# Patient Record
Sex: Male | Born: 1960
Health system: Southern US, Community
[De-identification: ages and names within clinical notes are randomized; demographics above are authoritative.]

## PROBLEM LIST (undated history)

## (undated) DIAGNOSIS — I10 Essential (primary) hypertension: Secondary | ICD-10-CM

## (undated) DIAGNOSIS — F17200 Nicotine dependence, unspecified, uncomplicated: Secondary | ICD-10-CM

## (undated) DIAGNOSIS — Z8719 Personal history of other diseases of the digestive system: Secondary | ICD-10-CM

## (undated) DIAGNOSIS — K219 Gastro-esophageal reflux disease without esophagitis: Secondary | ICD-10-CM

## (undated) DIAGNOSIS — N529 Male erectile dysfunction, unspecified: Secondary | ICD-10-CM

## (undated) DIAGNOSIS — C801 Malignant (primary) neoplasm, unspecified: Secondary | ICD-10-CM

## (undated) DIAGNOSIS — I2510 Atherosclerotic heart disease of native coronary artery without angina pectoris: Secondary | ICD-10-CM

## (undated) DIAGNOSIS — E785 Hyperlipidemia, unspecified: Secondary | ICD-10-CM

## (undated) DIAGNOSIS — J449 Chronic obstructive pulmonary disease, unspecified: Secondary | ICD-10-CM

## (undated) DIAGNOSIS — I4510 Unspecified right bundle-branch block: Secondary | ICD-10-CM

## (undated) DIAGNOSIS — Z951 Presence of aortocoronary bypass graft: Secondary | ICD-10-CM

## (undated) DIAGNOSIS — Z8571 Personal history of Hodgkin lymphoma: Secondary | ICD-10-CM

## (undated) DIAGNOSIS — Z954 Presence of other heart-valve replacement: Secondary | ICD-10-CM

## (undated) DIAGNOSIS — Z862 Personal history of diseases of the blood and blood-forming organs and certain disorders involving the immune mechanism: Secondary | ICD-10-CM

## (undated) DIAGNOSIS — D759 Disease of blood and blood-forming organs, unspecified: Secondary | ICD-10-CM

## (undated) DIAGNOSIS — E079 Disorder of thyroid, unspecified: Secondary | ICD-10-CM

## (undated) DIAGNOSIS — I350 Nonrheumatic aortic (valve) stenosis: Secondary | ICD-10-CM

## (undated) DIAGNOSIS — Z7901 Long term (current) use of anticoagulants: Secondary | ICD-10-CM

## (undated) DIAGNOSIS — J189 Pneumonia, unspecified organism: Secondary | ICD-10-CM

## (undated) DIAGNOSIS — E039 Hypothyroidism, unspecified: Secondary | ICD-10-CM

## (undated) HISTORY — DX: Male erectile dysfunction, unspecified: N52.9

## (undated) HISTORY — DX: Personal history of Hodgkin lymphoma: Z85.71

## (undated) HISTORY — DX: Nicotine dependence, unspecified, uncomplicated: F17.200

## (undated) HISTORY — DX: Personal history of other diseases of the digestive system: Z87.19

## (undated) HISTORY — DX: Unspecified right bundle-branch block: I45.10

## (undated) HISTORY — DX: Presence of aortocoronary bypass graft: Z95.1

## (undated) HISTORY — DX: Atherosclerotic heart disease of native coronary artery without angina pectoris: I25.10

## (undated) HISTORY — DX: Nonrheumatic aortic (valve) stenosis: I35.0

## (undated) HISTORY — DX: Hyperlipidemia, unspecified: E78.5

## (undated) HISTORY — DX: Gastro-esophageal reflux disease without esophagitis: K21.9

## (undated) HISTORY — DX: Essential (primary) hypertension: I10

## (undated) HISTORY — DX: Long term (current) use of anticoagulants: Z79.01

## (undated) HISTORY — DX: Personal history of diseases of the blood and blood-forming organs and certain disorders involving the immune mechanism: Z86.2

---

## 1982-04-15 HISTORY — PX: OTHER SURGICAL HISTORY: SHX169

## 1995-04-16 HISTORY — PX: OTHER SURGICAL HISTORY: SHX169

## 1997-04-15 HISTORY — PX: ABDOMINAL SURGERY: SHX537

## 2000-09-10 ENCOUNTER — Ambulatory Visit (HOSPITAL_COMMUNITY): Admission: RE | Admit: 2000-09-10 | Discharge: 2000-09-10 | Payer: Self-pay | Admitting: Family Medicine

## 2011-09-12 ENCOUNTER — Other Ambulatory Visit: Payer: Self-pay | Admitting: Gastroenterology

## 2011-09-12 DIAGNOSIS — Z1211 Encounter for screening for malignant neoplasm of colon: Secondary | ICD-10-CM

## 2011-09-19 ENCOUNTER — Ambulatory Visit
Admission: RE | Admit: 2011-09-19 | Discharge: 2011-09-19 | Disposition: A | Discharge status: Home / Self Care | Payer: BC Managed Care – PPO | Source: Ambulatory Visit | Attending: Gastroenterology | Admitting: Gastroenterology

## 2011-09-19 DIAGNOSIS — Z1211 Encounter for screening for malignant neoplasm of colon: Secondary | ICD-10-CM

## 2011-10-29 ENCOUNTER — Encounter (INDEPENDENT_AMBULATORY_CARE_PROVIDER_SITE_OTHER): Payer: Self-pay

## 2011-11-01 ENCOUNTER — Encounter (INDEPENDENT_AMBULATORY_CARE_PROVIDER_SITE_OTHER): Payer: Self-pay | Admitting: Surgery

## 2011-11-22 ENCOUNTER — Ambulatory Visit (INDEPENDENT_AMBULATORY_CARE_PROVIDER_SITE_OTHER): Payer: BC Managed Care – PPO | Admitting: Surgery

## 2014-05-13 ENCOUNTER — Other Ambulatory Visit: Payer: Self-pay | Admitting: Family Medicine

## 2014-05-13 DIAGNOSIS — R911 Solitary pulmonary nodule: Secondary | ICD-10-CM

## 2014-05-24 ENCOUNTER — Ambulatory Visit
Admission: RE | Admit: 2014-05-24 | Discharge: 2014-05-24 | Disposition: A | Discharge status: Home / Self Care | Payer: Managed Care, Other (non HMO) | Source: Ambulatory Visit | Attending: Family Medicine | Admitting: Family Medicine

## 2014-05-24 DIAGNOSIS — R911 Solitary pulmonary nodule: Secondary | ICD-10-CM

## 2014-05-24 MED ORDER — IOHEXOL 300 MG/ML  SOLN
75.0000 mL | Freq: Once | INTRAMUSCULAR | Status: AC | PRN
  Administered 2014-05-24: 75 mL via INTRAVENOUS

## 2014-05-26 ENCOUNTER — Other Ambulatory Visit: Payer: Self-pay | Admitting: Family Medicine

## 2014-05-26 DIAGNOSIS — N2889 Other specified disorders of kidney and ureter: Secondary | ICD-10-CM

## 2014-05-31 ENCOUNTER — Other Ambulatory Visit: Payer: Managed Care, Other (non HMO)

## 2014-06-08 ENCOUNTER — Other Ambulatory Visit: Payer: Managed Care, Other (non HMO)

## 2014-08-09 ENCOUNTER — Emergency Department (HOSPITAL_COMMUNITY): Payer: Managed Care, Other (non HMO)

## 2014-08-09 ENCOUNTER — Encounter (HOSPITAL_COMMUNITY): Payer: Self-pay | Admitting: *Deleted

## 2014-08-09 ENCOUNTER — Emergency Department (HOSPITAL_COMMUNITY)
Admission: EM | Admit: 2014-08-09 | Discharge: 2014-08-09 | Disposition: A | Discharge status: Home / Self Care | Payer: Managed Care, Other (non HMO) | Attending: Emergency Medicine | Admitting: Emergency Medicine

## 2014-08-09 DIAGNOSIS — J159 Unspecified bacterial pneumonia: Secondary | ICD-10-CM | POA: Insufficient documentation

## 2014-08-09 DIAGNOSIS — Z87891 Personal history of nicotine dependence: Secondary | ICD-10-CM | POA: Diagnosis not present

## 2014-08-09 DIAGNOSIS — E785 Hyperlipidemia, unspecified: Secondary | ICD-10-CM | POA: Diagnosis not present

## 2014-08-09 DIAGNOSIS — R011 Cardiac murmur, unspecified: Secondary | ICD-10-CM | POA: Diagnosis not present

## 2014-08-09 DIAGNOSIS — Z7901 Long term (current) use of anticoagulants: Secondary | ICD-10-CM | POA: Insufficient documentation

## 2014-08-09 DIAGNOSIS — Z87438 Personal history of other diseases of male genital organs: Secondary | ICD-10-CM | POA: Diagnosis not present

## 2014-08-09 DIAGNOSIS — Z79899 Other long term (current) drug therapy: Secondary | ICD-10-CM | POA: Diagnosis not present

## 2014-08-09 DIAGNOSIS — Z8571 Personal history of Hodgkin lymphoma: Secondary | ICD-10-CM | POA: Insufficient documentation

## 2014-08-09 DIAGNOSIS — R042 Hemoptysis: Secondary | ICD-10-CM | POA: Diagnosis not present

## 2014-08-09 DIAGNOSIS — Z862 Personal history of diseases of the blood and blood-forming organs and certain disorders involving the immune mechanism: Secondary | ICD-10-CM | POA: Diagnosis not present

## 2014-08-09 DIAGNOSIS — I10 Essential (primary) hypertension: Secondary | ICD-10-CM | POA: Insufficient documentation

## 2014-08-09 DIAGNOSIS — R079 Chest pain, unspecified: Secondary | ICD-10-CM | POA: Diagnosis present

## 2014-08-09 DIAGNOSIS — E079 Disorder of thyroid, unspecified: Secondary | ICD-10-CM | POA: Diagnosis not present

## 2014-08-09 DIAGNOSIS — J189 Pneumonia, unspecified organism: Secondary | ICD-10-CM

## 2014-08-09 HISTORY — DX: Disorder of thyroid, unspecified: E07.9

## 2014-08-09 LAB — BASIC METABOLIC PANEL
ANION GAP: 10 (ref 5–15)
BUN: 17 mg/dL (ref 6–23)
CO2: 25 mmol/L (ref 19–32)
Calcium: 8.6 mg/dL (ref 8.4–10.5)
Chloride: 107 mmol/L (ref 96–112)
Creatinine, Ser: 0.98 mg/dL (ref 0.50–1.35)
GFR calc non Af Amer: >90 mL/min (ref >90)
Glucose, Bld: 98 mg/dL (ref 70–99)
POTASSIUM: 4.3 mmol/L (ref 3.5–5.1)
SODIUM: 142 mmol/L (ref 135–145)

## 2014-08-09 LAB — I-STAT TROPONIN, ED: TROPONIN I, POC: 0.02 ng/mL (ref 0.00–0.08)

## 2014-08-09 LAB — CBC
HEMATOCRIT: 39.4 % (ref 39.0–52.0)
HEMOGLOBIN: 13.1 g/dL (ref 13.0–17.0)
MCH: 28.8 pg (ref 26.0–34.0)
MCHC: 33.2 g/dL (ref 30.0–36.0)
MCV: 86.6 fL (ref 78.0–100.0)
Platelets: 225 10*3/uL (ref 150–400)
RBC: 4.55 MIL/uL (ref 4.22–5.81)
RDW: 13.4 % (ref 11.5–15.5)
WBC: 8.4 10*3/uL (ref 4.0–10.5)

## 2014-08-09 LAB — PROTIME-INR
INR: 2.48 — ABNORMAL HIGH (ref 0.00–1.49)
PROTHROMBIN TIME: 27.1 s — AB (ref 11.6–15.2)

## 2014-08-09 MED ORDER — IOHEXOL 350 MG/ML SOLN
100.0000 mL | Freq: Once | INTRAVENOUS | Status: AC | PRN
  Administered 2014-08-09: 100 mL via INTRAVENOUS

## 2014-08-09 MED ORDER — LEVOFLOXACIN 750 MG PO TABS
750.0000 mg | ORAL_TABLET | Freq: Every day | ORAL | Status: DC
  Administered 2014-08-09: 750 mg via ORAL
  Filled 2014-08-09: qty 1

## 2014-08-09 MED ORDER — LEVOFLOXACIN 750 MG PO TABS: 750.0000 mg | ORAL_TABLET | Freq: Every day | ORAL | Status: DC

## 2014-08-09 NOTE — ED Notes (Signed)
Pt referred from [facility] w/ c/o chest pressure and hemoptysis that began yesterday evening - pt denies shortness of breath, n/v, diaphoresis or fever. Pt in no acute distress, pleasant cooperative.

## 2014-08-09 NOTE — Discharge Instructions (Signed)
Hemoptysis °Hemoptysis, which means coughing up blood, can be a sign of a minor problem or a serious medical condition. The blood that is coughed up may come from the lungs and airways. Coughed-up blood can also come from bleeding that occurs outside the lungs and airways. Blood can drain into the windpipe during a severe nosebleed or when blood is vomited from the stomach. Because hemoptysis can be a sign of something serious, a medical evaluation is required. For some people with hemoptysis, no definite cause is ever identified. °CAUSES  °The most common cause of hemoptysis is bronchitis. Some other common causes include:  °· A ruptured blood vessel caused by coughing or an infection.   °· A medical condition that causes damage to the large air passageways (bronchiectasis).   °· A blood clot in the lungs (pulmonary embolism).   °· Pneumonia.   °· Tuberculosis.   °· Breathing in a small foreign object.   °· Cancer. °For some people with hemoptysis, no definite cause is ever identified.   °HOME CARE INSTRUCTIONS °· Only take over-the-counter or prescription medicines as directed by your caregiver. Do not use cough suppressants unless your caregiver approves. °· If your caregiver prescribes antibiotic medicines, take them as directed. Finish them even if you start to feel better. °· Do not smoke. Also avoid secondhand smoke. °· Follow up with your caregiver as directed. °SEEK IMMEDIATE MEDICAL CARE IF:  °· You cough up bloody mucus for longer than a week. °· You have a blood-producing cough that is severe or getting worse. °· You have a blood-producing cough that comes and goes over time. °· You develop problems with your breathing.   °· You vomit blood. °· You develop bloody or black-colored stools. °· You have chest pain.   °· You develop night sweats. °· You feel faint or pass out.   °· You have a fever or persistent symptoms for more than 2-3 days.   °· You have a fever and your symptoms suddenly get worse. °MAKE  SURE YOU: °· Understand these instructions. °· Will watch your condition. °· Will get help right away if you are not doing well or get worse. °Document Released: 06/10/2001 Document Revised: 03/18/2012 Document Reviewed: 01/17/2012 °ExitCare® Patient Information ©2015 [employer]. This information is not intended to replace advice given to you by your health care provider. Make sure you discuss any questions you have with your health care provider. ° °Pneumonia °Pneumonia is an infection of the lungs.  °CAUSES °Pneumonia may be caused by bacteria or a virus. Usually, these infections are caused by breathing infectious particles into the lungs (respiratory tract). °SIGNS AND SYMPTOMS  °· Cough. °· Fever. °· Chest pain. °· Increased rate of breathing. °· Wheezing. °· Mucus production. °DIAGNOSIS  °If you have the common symptoms of pneumonia, your health care provider will typically confirm the diagnosis with a chest X-ray. The X-ray will show an abnormality in the lung (pulmonary infiltrate) if you have pneumonia. Other tests of your blood, urine, or sputum may be done to find the specific cause of your pneumonia. Your health care provider may also do tests (blood gases or pulse oximetry) to see how well your lungs are working. °TREATMENT  °Some forms of pneumonia may be spread to other people when you cough or sneeze. You may be asked to wear a mask before and during your exam. Pneumonia that is caused by bacteria is treated with antibiotic medicine. Pneumonia that is caused by the influenza virus may be treated with an antiviral medicine. Most other viral infections must run their   course. These infections will not respond to antibiotics.  °HOME CARE INSTRUCTIONS  °· Cough suppressants may be used if you are losing too much rest. However, coughing protects you by clearing your lungs. You should avoid using cough suppressants if you can. °· Your health care provider may have prescribed medicine if he or she thinks  your pneumonia is caused by bacteria or influenza. Finish your medicine even if you start to feel better. °· Your health care provider may also prescribe an expectorant. This loosens the mucus to be coughed up. °· Take medicines only as directed by your health care provider. °· Do not smoke. Smoking is a common cause of bronchitis and can contribute to pneumonia. If you are a smoker and continue to smoke, your cough may last several weeks after your pneumonia has cleared. °· A cold steam vaporizer or humidifier in your room or home may help loosen mucus. °· Coughing is often worse at night. Sleeping in a semi-upright position in a recliner or using a couple pillows under your head will help with this. °· Get rest as you feel it is needed. Your body will usually let you know when you need to rest. °PREVENTION °A pneumococcal shot (vaccine) is available to prevent a common bacterial cause of pneumonia. This is usually suggested for: °· People over 65 years old. °· Patients on chemotherapy. °· People with chronic lung problems, such as bronchitis or emphysema. °· People with immune system problems. °If you are over 65 or have a high risk condition, you may receive the pneumococcal vaccine if you have not received it before. In some countries, a routine influenza vaccine is also recommended. This vaccine can help prevent some cases of pneumonia. You may be offered the influenza vaccine as part of your care. °If you smoke, it is time to quit. You may receive instructions on how to stop smoking. Your health care provider can provide medicines and counseling to help you quit. °SEEK MEDICAL CARE IF: °You have a fever. °SEEK IMMEDIATE MEDICAL CARE IF:  °· Your illness becomes worse. This is especially true if you are elderly or weakened from any other disease. °· You cannot control your cough with suppressants and are losing sleep. °· You begin coughing up blood. °· You develop pain which is getting worse or is uncontrolled  with medicines. °· Any of the symptoms which initially brought you in for treatment are getting worse rather than better. °· You develop shortness of breath or chest pain. °MAKE SURE YOU:  °· Understand these instructions. °· Will watch your condition. °· Will get help right away if you are not doing well or get worse. °Document Released: 04/01/2005 Document Revised: 08/16/2013 Document Reviewed: 06/21/2010 °ExitCare® Patient Information ©2015 [employer]. This information is not intended to replace advice given to you by your health care provider. Make sure you discuss any questions you have with your health care provider. ° °

## 2014-08-09 NOTE — Progress Notes (Signed)
EDCM spoke to patient at bedside.  Patient confirms his pcp is Dr. [name] at [facility].  System updated.

## 2014-08-09 NOTE — ED Provider Notes (Addendum)
CSN: [id number]     Arrival date & time 08/09/14  1848 History   First MD Initiated Contact with Patient 08/09/14 2041     Chief Complaint  Patient presents with  . Chest Pain  . Hemoptysis     (Consider location/radiation/quality/duration/timing/severity/associated sxs/prior Treatment) HPI Comments: Patient presents to the ER for evaluation of chest pressure and hemoptysis. Symptoms began yesterday. Patient reports a constant discomfort in the center of his chest. He was diagnosed with bronchitis several months ago and the cough never left. In the last 24 hours, however, he has been coughing up a fair amount of blood. He does report a history of lupus anticoagulant disorder, takes Coumadin chronically. He has never had a PE, reports clots in his small intestines previously. No abdominal pain. Patient does not feel short of breath. He has not been expressing palpitations.  Patient is a 54 y.o. male presenting with chest pain.  Chest Pain   Past Medical History  Diagnosis Date  . Hypertension   . Hyperlipidemia   . Erectile dysfunction   . Hx of lupus anticoagulant disorder     history of blood work showing lupus  . Thyroid disease   . Hodgkin's disease    Past Surgical History  Procedure Laterality Date  . Abdominal surgery     History reviewed. No pertinent family history. History  Substance Use Topics  . Smoking status: Former Smoker    Quit date: 06/12/2014  . Smokeless tobacco: Never Used  . Alcohol Use: No    Review of Systems  Respiratory:       Hemoptysis  Cardiovascular: Positive for chest pain.  All other systems reviewed and are negative.     Allergies  Review of patient's allergies indicates no known allergies.  Home Medications   Prior to Admission medications   Medication Sig Start Date End Date Taking? Authorizing Provider  acetaminophen (TYLENOL) 500 MG tablet Take 500 mg by mouth every 6 (six) hours as needed for headache.   Yes Historical  Provider, MD  fish oil-omega-3 fatty acids 1000 MG capsule Take 2 g by mouth at bedtime.    Yes Historical Provider, MD  levothyroxine (SYNTHROID, LEVOTHROID) 50 MCG tablet Take 1 tablet by mouth daily. 08/04/14  Yes Historical Provider, MD  QUINAPRIL HCL PO Take 40 mg by mouth 2 (two) times daily.    Yes Historical Provider, MD  SIMVASTATIN PO Take 80 mg by mouth every evening.    Yes Historical Provider, MD  warfarin (COUMADIN) 5 MG tablet Take 5 mg by mouth at bedtime.    Yes Historical Provider, MD  levofloxacin (LEVAQUIN) 750 MG tablet Take 1 tablet (750 mg total) by mouth daily. 08/09/14   Orpah Greek, MD   BP 151/87 mmHg  Pulse 76  Temp(Src) 98.3 F (36.8 C) (Oral)  Resp 17  SpO2 96% Physical Exam  Constitutional: He is oriented to person, place, and time. He appears well-developed and well-nourished. No distress.  HENT:  Head: Normocephalic and atraumatic.  Right Ear: Hearing normal.  Left Ear: Hearing normal.  Nose: Nose normal.  Mouth/Throat: Oropharynx is clear and moist and mucous membranes are normal.  Eyes: Conjunctivae and EOM are normal. Pupils are equal, round, and reactive to light.  Neck: Normal range of motion. Neck supple.  Cardiovascular: Regular rhythm, S1 normal and S2 normal.  Exam reveals no gallop and no friction rub.   Murmur heard.  Systolic murmur is present with a grade of 2/6  Pulmonary/Chest: Effort  normal and breath sounds normal. No respiratory distress. He exhibits no tenderness.  Abdominal: Soft. Normal appearance and bowel sounds are normal. There is no hepatosplenomegaly. There is no tenderness. There is no rebound, no guarding, no tenderness at McBurney's point and negative Murphy's sign. No hernia.  Musculoskeletal: Normal range of motion.  Neurological: He is alert and oriented to person, place, and time. He has normal strength. No cranial nerve deficit or sensory deficit. Coordination normal. GCS eye subscore is 4. GCS verbal subscore  is 5. GCS motor subscore is 6.  Skin: Skin is warm, dry and intact. No rash noted. No cyanosis.  Psychiatric: He has a normal mood and affect. His speech is normal and behavior is normal. Thought content normal.  Nursing note and vitals reviewed.   ED Course  Procedures (including critical care time) Labs Review Labs Reviewed  PROTIME-INR - Abnormal; Notable for the following:    Prothrombin Time 27.1 (*)    INR 2.48 (*)    All other components within normal limits  CBC  BASIC METABOLIC PANEL  I-STAT TROPOININ, ED    Imaging Review Dg Chest 2 View  08/09/2014   CLINICAL DATA:  One day history of chest pressure and discomfort. Intermittent hemoptysis  EXAM: CHEST  2 VIEW  COMPARISON:  chest radiograph May 01, 2011; chest CT May 24, 2014  FINDINGS: There is mild central interstitial prominence without airspace consolidation or edema. Heart size and pulmonary vascularity are normal. No adenopathy. No bone lesions.  IMPRESSION: Suspect central bronchiolitis.  No edema or consolidation.   Electronically Signed   By: Lowella Grip III M.D.   On: 08/09/2014 21:34   Ct Angio Chest Pe W/cm &/or Wo Cm  08/09/2014   CLINICAL DATA:  Acute onset of chest pressure and hemoptysis. Initial encounter.  EXAM: CT ANGIOGRAPHY CHEST WITH CONTRAST  TECHNIQUE: Multidetector CT imaging of the chest was performed using the standard protocol during bolus administration of intravenous contrast. Multiplanar CT image reconstructions and MIPs were obtained to evaluate the vascular anatomy.  CONTRAST:  157mL OMNIPAQUE IOHEXOL 350 MG/ML SOLN  COMPARISON:  CT of the chest performed 01/22/2015  FINDINGS: There is no evidence of pulmonary embolus.  Patchy airspace opacity is noted within the right upper lobe, and mild right basilar atelectasis is noted. The right upper lobe airspace opacity raises concern for pneumonia. There is no evidence of pleural effusion or pneumothorax. No masses are identified; no abnormal  focal contrast enhancement is seen.  The mediastinum is unremarkable in appearance. Dense calcification is noted at the aortic valve, mild calcification at the mitral valve, and scattered coronary artery calcifications are seen. No pericardial effusion is identified. The great vessels are grossly unremarkable in appearance, aside from scattered calcification along the proximal great vessels and aortic arch. No axillary lymphadenopathy is seen. The visualized portions of the thyroid gland are unremarkable in appearance.  The visualized portions of the liver and spleen are unremarkable.  No acute osseous abnormalities are seen. A vertebral body hemangioma is noted at T6.  Review of the MIP images confirms the above findings.  IMPRESSION: 1. No evidence of pulmonary embolus. 2. Patchy airspace opacity within the right upper lobe, and mild right basilar atelectasis. Right upper lobe airspace opacity raises concern for mild pneumonia. 3. Scattered coronary artery calcifications. Dense calcification at the aortic valve and mild calcification at the mitral valve.   Electronically Signed   By: Garald Balding M.D.   On: 08/09/2014 22:52  EKG Interpretation   Date/Time:  Tuesday August 09 2014 19:00:59 EDT Ventricular Rate:  79 PR Interval:  192 QRS Duration: 70 QT Interval:  379 QTC Calculation: 434 R Axis:   68 Text Interpretation:  Sinus rhythm Abnormal R-wave progression, early  transition Baseline wander in lead(s) V2 No previous tracing Reconfirmed  by POLLINA  MD, [place] [phone]) on 08/09/2014 11:15:25 PM      MDM   Final diagnoses:  Hemoptysis  CAP (community acquired pneumonia)    Patient presents to the ER for evaluation of cough which has been productive of blood and sputum as well as pressure in his chest. Patient does have a history of lupus anticoagulant. He is on Coumadin and is therapeutic. EKG does not suggest ischemia or infarct. Troponin is negative. Chest x-ray did not show  any acute abnormality. Based on his hypercoagulable state, PE was considered. CT scan was performed. No evidence of PE was seen, but he does have evidence of right upper lobe pneumonia. Patient is not hypoxic. He is appropriate for outpatient treatment of community-acquired pneumonia. He was initiated on Levaquin. Will need to follow-up with his primary doctor to ensure resolution and repeat INR.    Orpah Greek, MD 08/09/14 2317  Orpah Greek, MD 08/09/14 [phone]

## 2015-05-18 ENCOUNTER — Other Ambulatory Visit: Payer: Self-pay | Admitting: Family Medicine

## 2015-05-18 DIAGNOSIS — R93429 Abnormal radiologic findings on diagnostic imaging of unspecified kidney: Secondary | ICD-10-CM

## 2015-06-23 ENCOUNTER — Other Ambulatory Visit: Payer: Self-pay | Admitting: Family Medicine

## 2015-06-23 DIAGNOSIS — N2889 Other specified disorders of kidney and ureter: Secondary | ICD-10-CM

## 2015-06-23 DIAGNOSIS — R9389 Abnormal findings on diagnostic imaging of other specified body structures: Secondary | ICD-10-CM

## 2016-04-15 DIAGNOSIS — I2510 Atherosclerotic heart disease of native coronary artery without angina pectoris: Secondary | ICD-10-CM

## 2016-04-15 HISTORY — DX: Atherosclerotic heart disease of native coronary artery without angina pectoris: I25.10

## 2016-04-17 ENCOUNTER — Other Ambulatory Visit: Payer: Self-pay | Admitting: Urology

## 2016-04-17 DIAGNOSIS — D4101 Neoplasm of uncertain behavior of right kidney: Secondary | ICD-10-CM

## 2016-04-30 ENCOUNTER — Ambulatory Visit (HOSPITAL_COMMUNITY): Payer: Managed Care, Other (non HMO)

## 2016-08-13 DIAGNOSIS — C801 Malignant (primary) neoplasm, unspecified: Secondary | ICD-10-CM

## 2016-08-13 HISTORY — DX: Malignant (primary) neoplasm, unspecified: C80.1

## 2016-08-21 ENCOUNTER — Other Ambulatory Visit: Payer: Self-pay | Admitting: Urology

## 2016-08-29 NOTE — Patient Instructions (Signed)
Michael Delgado  08/29/2016   Your procedure is scheduled on: 09-11-16  Report to [facility] to 3rd floor to [place] at 08:30 AM.    Call this number if you have problems the morning of surgery [phone]    Remember: ONLY 1 PERSON MAY GO WITH YOU TO SHORT STAY TO GET  READY MORNING OF [place].  Do not eat food or drink liquids :After Midnight.   Please consume a clear liquid diet the day before surgery to prevent dehydration.   CLEAR LIQUID DIET   Foods Allowed                                                                     Foods Excluded  Coffee and tea, regular and decaf                             liquids that you cannot  Plain Jell-O in any flavor                                             see through such as: Fruit ices (not with fruit pulp)                                     milk, soups, orange juice  Iced Popsicles                                    All solid food Carbonated beverages, regular and diet                                    Cranberry, grape and apple juices Sports drinks like Gatorade Lightly seasoned clear broth or consume(fat free) Sugar, honey syrup  Sample Menu Breakfast                                Lunch                                     Supper Cranberry juice                    Beef broth                            Chicken broth Jell-O                                     Grape juice  Apple juice Coffee or tea                        Jell-O                                      Popsicle                                                Coffee or tea                        Coffee or tea  _____________________________________________________________________    Take these medicines the morning of surgery with A SIP OF WATER: Levothyroxine (Synthroid)                               You may not have any metal on your body including hair pins and       piercings  Do not wear jewelry, make-up, lotions, powders or perfumes, deodorant             Men may shave face and neck.   Do not bring valuables to the hospital. [place].  Contacts, dentures or bridgework may not be worn into surgery.  Leave suitcase in the car. After surgery it may be brought to your room.                  Please read over the following fact sheets you were given: _____________________________________________________________________  [facility] - Preparing for Surgery Before surgery, you can play an important role.  Because skin is not sterile, your skin needs to be as free of germs as possible.  You can reduce the number of germs on your skin by washing with CHG (chlorahexidine gluconate) soap before surgery.  CHG is an antiseptic cleaner which kills germs and bonds with the skin to continue killing germs even after washing. Please DO NOT use if you have an allergy to CHG or antibacterial soaps.  If your skin becomes reddened/irritated stop using the CHG and inform your nurse when you arrive at Short Stay. Do not shave (including legs and underarms) for at least 48 hours prior to the first CHG shower.  You may shave your face/neck. Please follow these instructions carefully:  1.  Shower with CHG Soap the night before surgery and the  morning of Surgery.  2.  If you choose to wash your hair, wash your hair first as usual with your  normal  shampoo.  3.  After you shampoo, rinse your hair and body thoroughly to remove the  shampoo.                           4.  Use CHG as you would any other liquid soap.  You can apply chg directly  to the skin and wash                       Gently with a scrungie or clean washcloth.  5.  Apply the  CHG Soap to your body ONLY FROM THE NECK DOWN.   Do not use on face/ open                           Wound or open sores. Avoid contact with eyes, ears mouth and genitals (private parts).                        Wash face,  Genitals (private parts) with your normal soap.             6.  Wash thoroughly, paying special attention to the area where your surgery  will be performed.  7.  Thoroughly rinse your body with warm water from the neck down.  8.  DO NOT shower/wash with your normal soap after using and rinsing off  the CHG Soap.                9.  Pat yourself dry with a clean towel.            10.  Wear clean pajamas.            11.  Place clean sheets on your bed the night of your first shower and do not  sleep with pets. Day of Surgery : Do not apply any lotions/deodorants the morning of surgery.  Please wear clean clothes to the hospital/surgery center.  FAILURE TO FOLLOW THESE INSTRUCTIONS MAY RESULT IN THE CANCELLATION OF YOUR SURGERY PATIENT SIGNATURE_________________________________  NURSE SIGNATURE__________________________________  ________________________________________________________________________

## 2016-09-03 ENCOUNTER — Encounter (HOSPITAL_COMMUNITY)
Admission: RE | Admit: 2016-09-03 | Discharge: 2016-09-03 | Disposition: A | Discharge status: Home / Self Care | Payer: Managed Care, Other (non HMO) | Source: Ambulatory Visit | Attending: Urology | Admitting: Urology

## 2016-09-03 ENCOUNTER — Encounter (HOSPITAL_COMMUNITY): Payer: Self-pay

## 2016-09-03 ENCOUNTER — Encounter (INDEPENDENT_AMBULATORY_CARE_PROVIDER_SITE_OTHER): Payer: Self-pay

## 2016-09-03 DIAGNOSIS — Z01812 Encounter for preprocedural laboratory examination: Secondary | ICD-10-CM | POA: Diagnosis present

## 2016-09-03 DIAGNOSIS — Z01810 Encounter for preprocedural cardiovascular examination: Secondary | ICD-10-CM | POA: Insufficient documentation

## 2016-09-03 DIAGNOSIS — I10 Essential (primary) hypertension: Secondary | ICD-10-CM | POA: Insufficient documentation

## 2016-09-03 HISTORY — DX: Hypothyroidism, unspecified: E03.9

## 2016-09-03 LAB — BASIC METABOLIC PANEL
Anion gap: 8 (ref 5–15)
BUN: 24 mg/dL — ABNORMAL HIGH (ref 6–20)
CHLORIDE: 104 mmol/L (ref 101–111)
CO2: 27 mmol/L (ref 22–32)
CREATININE: 1.05 mg/dL (ref 0.61–1.24)
Calcium: 9 mg/dL (ref 8.9–10.3)
GFR calc non Af Amer: >60 mL/min (ref >60)
Glucose, Bld: 116 mg/dL — ABNORMAL HIGH (ref 65–99)
POTASSIUM: 4.4 mmol/L (ref 3.5–5.1)
SODIUM: 139 mmol/L (ref 135–145)

## 2016-09-03 LAB — CBC
HCT: 42 % (ref 39.0–52.0)
Hemoglobin: 13.7 g/dL (ref 13.0–17.0)
MCH: 27.8 pg (ref 26.0–34.0)
MCHC: 32.6 g/dL (ref 30.0–36.0)
MCV: 85.4 fL (ref 78.0–100.0)
PLATELETS: 269 10*3/uL (ref 150–400)
RBC: 4.92 MIL/uL (ref 4.22–5.81)
RDW: 13.4 % (ref 11.5–15.5)
WBC: 6.2 10*3/uL (ref 4.0–10.5)

## 2016-09-03 LAB — ABO/RH: ABO/RH(D): A POS

## 2016-09-03 NOTE — Progress Notes (Signed)
09-03-16 BMP results routed to Dr. Tresa Moore for review.

## 2016-09-10 MED ORDER — DEXTROSE 5 % IV SOLN
3.0000 g | INTRAVENOUS | Status: AC
  Administered 2016-09-11: 3 g via INTRAVENOUS
  Filled 2016-09-10: qty 3

## 2016-09-11 ENCOUNTER — Inpatient Hospital Stay (HOSPITAL_COMMUNITY)
Admission: RE | Admit: 2016-09-11 | Discharge: 2016-09-12 | DRG: 660 | Disposition: A | Discharge status: Home / Self Care | Payer: Managed Care, Other (non HMO) | Source: Ambulatory Visit | Attending: Urology | Admitting: Urology

## 2016-09-11 ENCOUNTER — Inpatient Hospital Stay (HOSPITAL_COMMUNITY): Payer: Managed Care, Other (non HMO) | Admitting: Certified Registered Nurse Anesthetist

## 2016-09-11 ENCOUNTER — Encounter (HOSPITAL_COMMUNITY): Payer: Self-pay | Admitting: *Deleted

## 2016-09-11 ENCOUNTER — Encounter (HOSPITAL_COMMUNITY)
Admission: RE | Disposition: A | Discharge status: Home / Self Care | Payer: Self-pay | Source: Ambulatory Visit | Attending: Urology

## 2016-09-11 DIAGNOSIS — J449 Chronic obstructive pulmonary disease, unspecified: Secondary | ICD-10-CM | POA: Diagnosis present

## 2016-09-11 DIAGNOSIS — Z8571 Personal history of Hodgkin lymphoma: Secondary | ICD-10-CM | POA: Diagnosis not present

## 2016-09-11 DIAGNOSIS — E669 Obesity, unspecified: Secondary | ICD-10-CM | POA: Diagnosis present

## 2016-09-11 DIAGNOSIS — Z9049 Acquired absence of other specified parts of digestive tract: Secondary | ICD-10-CM

## 2016-09-11 DIAGNOSIS — Z9221 Personal history of antineoplastic chemotherapy: Secondary | ICD-10-CM

## 2016-09-11 DIAGNOSIS — N2889 Other specified disorders of kidney and ureter: Secondary | ICD-10-CM | POA: Diagnosis present

## 2016-09-11 DIAGNOSIS — E785 Hyperlipidemia, unspecified: Secondary | ICD-10-CM | POA: Diagnosis present

## 2016-09-11 DIAGNOSIS — E039 Hypothyroidism, unspecified: Secondary | ICD-10-CM | POA: Diagnosis present

## 2016-09-11 DIAGNOSIS — I10 Essential (primary) hypertension: Secondary | ICD-10-CM | POA: Diagnosis present

## 2016-09-11 DIAGNOSIS — Z6839 Body mass index (BMI) 39.0-39.9, adult: Secondary | ICD-10-CM | POA: Diagnosis not present

## 2016-09-11 DIAGNOSIS — Z85528 Personal history of other malignant neoplasm of kidney: Secondary | ICD-10-CM | POA: Diagnosis present

## 2016-09-11 DIAGNOSIS — Z87891 Personal history of nicotine dependence: Secondary | ICD-10-CM

## 2016-09-11 DIAGNOSIS — D6862 Lupus anticoagulant syndrome: Secondary | ICD-10-CM | POA: Diagnosis present

## 2016-09-11 HISTORY — PX: ROBOTIC ASSITED PARTIAL NEPHRECTOMY: SHX6087

## 2016-09-11 LAB — TYPE AND SCREEN
ABO/RH(D): A POS
ANTIBODY SCREEN: NEGATIVE

## 2016-09-11 LAB — HEMOGLOBIN AND HEMATOCRIT, BLOOD
HEMATOCRIT: 38.3 % — AB (ref 39.0–52.0)
HEMOGLOBIN: 12.7 g/dL — AB (ref 13.0–17.0)

## 2016-09-11 SURGERY — NEPHRECTOMY, PARTIAL, ROBOT-ASSISTED
Anesthesia: General | Laterality: Right

## 2016-09-11 MED ORDER — ONDANSETRON HCL 4 MG/2ML IJ SOLN
INTRAMUSCULAR | Status: AC
  Filled 2016-09-11: qty 2

## 2016-09-11 MED ORDER — LIDOCAINE 2% (20 MG/ML) 5 ML SYRINGE
INTRAMUSCULAR | Status: DC | PRN
  Administered 2016-09-11: 100 mg via INTRAVENOUS

## 2016-09-11 MED ORDER — HYDROMORPHONE HCL 1 MG/ML IJ SOLN
0.2500 mg | INTRAMUSCULAR | Status: DC | PRN
  Administered 2016-09-11 (×3): 0.5 mg via INTRAVENOUS

## 2016-09-11 MED ORDER — MAGNESIUM CITRATE PO SOLN
1.0000 | Freq: Once | ORAL | Status: DC
  Filled 2016-09-11: qty 296

## 2016-09-11 MED ORDER — DEXTROSE-NACL 5-0.45 % IV SOLN
INTRAVENOUS | Status: DC
  Administered 2016-09-11: 16:00:00 via INTRAVENOUS

## 2016-09-11 MED ORDER — FENTANYL CITRATE (PF) 100 MCG/2ML IJ SOLN
INTRAMUSCULAR | Status: DC | PRN
  Administered 2016-09-11 (×2): 50 ug via INTRAVENOUS
  Administered 2016-09-11 (×2): 25 ug via INTRAVENOUS

## 2016-09-11 MED ORDER — MIDAZOLAM HCL 5 MG/5ML IJ SOLN
INTRAMUSCULAR | Status: DC | PRN
  Administered 2016-09-11: 2 mg via INTRAVENOUS

## 2016-09-11 MED ORDER — SIMVASTATIN 40 MG PO TABS
40.0000 mg | ORAL_TABLET | Freq: Every evening | ORAL | Status: DC
  Administered 2016-09-11: 40 mg via ORAL
  Filled 2016-09-11: qty 1

## 2016-09-11 MED ORDER — OXYCODONE HCL 5 MG PO TABS
5.0000 mg | ORAL_TABLET | ORAL | Status: DC | PRN
  Administered 2016-09-12 (×3): 5 mg via ORAL
  Filled 2016-09-11 (×3): qty 1

## 2016-09-11 MED ORDER — BUPIVACAINE LIPOSOME 1.3 % IJ SUSP
20.0000 mL | Freq: Once | INTRAMUSCULAR | Status: AC
  Administered 2016-09-11: 20 mL
  Filled 2016-09-11: qty 20

## 2016-09-11 MED ORDER — PROPOFOL 10 MG/ML IV BOLUS
INTRAVENOUS | Status: AC
  Filled 2016-09-11: qty 40

## 2016-09-11 MED ORDER — ONDANSETRON HCL 4 MG/2ML IJ SOLN
INTRAMUSCULAR | Status: DC | PRN
  Administered 2016-09-11: 4 mg via INTRAVENOUS

## 2016-09-11 MED ORDER — LACTATED RINGERS IR SOLN
Status: DC | PRN
  Administered 2016-09-11: 1000 mL

## 2016-09-11 MED ORDER — LEVOTHYROXINE SODIUM 50 MCG PO TABS
50.0000 ug | ORAL_TABLET | Freq: Every day | ORAL | Status: DC
  Administered 2016-09-12: 50 ug via ORAL
  Filled 2016-09-11: qty 1

## 2016-09-11 MED ORDER — HYDROMORPHONE HCL 1 MG/ML IJ SOLN
INTRAMUSCULAR | Status: AC
  Filled 2016-09-11: qty 1

## 2016-09-11 MED ORDER — MEPERIDINE HCL 50 MG/ML IJ SOLN: 6.2500 mg | INTRAMUSCULAR | Status: DC | PRN

## 2016-09-11 MED ORDER — KETOROLAC TROMETHAMINE 30 MG/ML IJ SOLN
30.0000 mg | Freq: Once | INTRAMUSCULAR | Status: DC | PRN
  Administered 2016-09-11: 30 mg via INTRAVENOUS

## 2016-09-11 MED ORDER — ONDANSETRON HCL 4 MG/2ML IJ SOLN: 4.0000 mg | INTRAMUSCULAR | Status: DC | PRN

## 2016-09-11 MED ORDER — POTASSIUM 99 MG PO TABS: 1.0000 | ORAL_TABLET | Freq: Every day | ORAL | Status: DC

## 2016-09-11 MED ORDER — HYDROCODONE-ACETAMINOPHEN 5-325 MG PO TABS: 1.0000 | ORAL_TABLET | Freq: Four times a day (QID) | ORAL | 0 refills | Status: DC | PRN

## 2016-09-11 MED ORDER — SUCCINYLCHOLINE CHLORIDE 200 MG/10ML IV SOSY
PREFILLED_SYRINGE | INTRAVENOUS | Status: AC
  Filled 2016-09-11: qty 10

## 2016-09-11 MED ORDER — LACTATED RINGERS IV SOLN
INTRAVENOUS | Status: DC
  Administered 2016-09-11: 10:00:00 via INTRAVENOUS

## 2016-09-11 MED ORDER — ACETAMINOPHEN 500 MG PO TABS
1000.0000 mg | ORAL_TABLET | Freq: Four times a day (QID) | ORAL | Status: AC
  Administered 2016-09-11 – 2016-09-12 (×4): 1000 mg via ORAL
  Filled 2016-09-11 (×4): qty 2

## 2016-09-11 MED ORDER — HYDROMORPHONE HCL 1 MG/ML IJ SOLN: 0.5000 mg | INTRAMUSCULAR | Status: DC | PRN

## 2016-09-11 MED ORDER — DEXAMETHASONE SODIUM PHOSPHATE 10 MG/ML IJ SOLN
INTRAMUSCULAR | Status: AC
  Filled 2016-09-11: qty 1

## 2016-09-11 MED ORDER — DEXTROSE 5 % IV SOLN
INTRAVENOUS | Status: DC | PRN
  Administered 2016-09-11: 50 ug/min via INTRAVENOUS

## 2016-09-11 MED ORDER — EPHEDRINE SULFATE 50 MG/ML IJ SOLN
INTRAMUSCULAR | Status: DC | PRN
  Administered 2016-09-11: 10 mg via INTRAVENOUS

## 2016-09-11 MED ORDER — SODIUM CHLORIDE 0.9 % IJ SOLN
INTRAMUSCULAR | Status: DC | PRN
  Administered 2016-09-11: 20 mL

## 2016-09-11 MED ORDER — PROPOFOL 10 MG/ML IV BOLUS
INTRAVENOUS | Status: DC | PRN
  Administered 2016-09-11: 200 mg via INTRAVENOUS

## 2016-09-11 MED ORDER — SUGAMMADEX SODIUM 500 MG/5ML IV SOLN
INTRAVENOUS | Status: AC
  Filled 2016-09-11: qty 5

## 2016-09-11 MED ORDER — DEXAMETHASONE SODIUM PHOSPHATE 10 MG/ML IJ SOLN
INTRAMUSCULAR | Status: DC | PRN
  Administered 2016-09-11: 10 mg via INTRAVENOUS

## 2016-09-11 MED ORDER — SODIUM CHLORIDE 0.9 % IJ SOLN
INTRAMUSCULAR | Status: AC
  Filled 2016-09-11: qty 20

## 2016-09-11 MED ORDER — DIPHENHYDRAMINE HCL 50 MG/ML IJ SOLN: 12.5000 mg | Freq: Four times a day (QID) | INTRAMUSCULAR | Status: DC | PRN

## 2016-09-11 MED ORDER — SUGAMMADEX SODIUM 200 MG/2ML IV SOLN
INTRAVENOUS | Status: DC | PRN
  Administered 2016-09-11: 300 mg via INTRAVENOUS

## 2016-09-11 MED ORDER — KETOROLAC TROMETHAMINE 30 MG/ML IJ SOLN
INTRAMUSCULAR | Status: AC
  Filled 2016-09-11: qty 1

## 2016-09-11 MED ORDER — ROCURONIUM BROMIDE 50 MG/5ML IV SOSY
PREFILLED_SYRINGE | INTRAVENOUS | Status: DC | PRN
Start: 2016-09-11 — End: 2016-09-11
  Administered 2016-09-11: 10 mg via INTRAVENOUS
  Administered 2016-09-11: 5 mg via INTRAVENOUS
  Administered 2016-09-11: 20 mg via INTRAVENOUS
  Administered 2016-09-11: 10 mg via INTRAVENOUS
  Administered 2016-09-11: 20 mg via INTRAVENOUS
  Administered 2016-09-11: 35 mg via INTRAVENOUS
  Administered 2016-09-11 (×2): 10 mg via INTRAVENOUS

## 2016-09-11 MED ORDER — LIDOCAINE 2% (20 MG/ML) 5 ML SYRINGE
INTRAMUSCULAR | Status: AC
  Filled 2016-09-11: qty 5

## 2016-09-11 MED ORDER — PHENYLEPHRINE HCL 10 MG/ML IJ SOLN
INTRAMUSCULAR | Status: DC | PRN
  Administered 2016-09-11 (×3): 120 ug via INTRAVENOUS

## 2016-09-11 MED ORDER — HYDROMORPHONE HCL 1 MG/ML IJ SOLN
INTRAMUSCULAR | Status: AC
  Filled 2016-09-11: qty 0.5

## 2016-09-11 MED ORDER — ROCURONIUM BROMIDE 50 MG/5ML IV SOSY
PREFILLED_SYRINGE | INTRAVENOUS | Status: AC
  Filled 2016-09-11: qty 5

## 2016-09-11 MED ORDER — ROCURONIUM BROMIDE 50 MG/5ML IV SOSY
PREFILLED_SYRINGE | INTRAVENOUS | Status: AC
  Filled 2016-09-11: qty 10

## 2016-09-11 MED ORDER — STERILE WATER FOR IRRIGATION IR SOLN
Status: DC | PRN
  Administered 2016-09-11: 1000 mL

## 2016-09-11 MED ORDER — MIDAZOLAM HCL 2 MG/2ML IJ SOLN
INTRAMUSCULAR | Status: AC
  Filled 2016-09-11: qty 2

## 2016-09-11 MED ORDER — DIPHENHYDRAMINE HCL 12.5 MG/5ML PO ELIX: 12.5000 mg | ORAL_SOLUTION | Freq: Four times a day (QID) | ORAL | Status: DC | PRN

## 2016-09-11 MED ORDER — FENTANYL CITRATE (PF) 250 MCG/5ML IJ SOLN
INTRAMUSCULAR | Status: AC
  Filled 2016-09-11: qty 5

## 2016-09-11 SURGICAL SUPPLY — 78 items
ADH SKN CLS APL DERMABOND .7 (GAUZE/BANDAGES/DRESSINGS) ×1
AGENT HMST KT MTR STRL THRMB (HEMOSTASIS) ×1
APL ESCP 34 STRL LF DISP (HEMOSTASIS)
APPLICATOR COTTON TIP 6IN STRL (MISCELLANEOUS) ×1 IMPLANT
APPLICATOR SURGIFLO ENDO (HEMOSTASIS) ×1 IMPLANT
BAG SPEC RTRVL LRG 6X4 10 (ENDOMECHANICALS) ×1
CHLORAPREP W/TINT 26ML (MISCELLANEOUS) ×2 IMPLANT
CLIP LIGATING HEM O LOK PURPLE (MISCELLANEOUS) ×2 IMPLANT
CLIP LIGATING HEMO LOK XL GOLD (MISCELLANEOUS) ×1 IMPLANT
CLIP LIGATING HEMO O LOK GREEN (MISCELLANEOUS) ×6 IMPLANT
CLIP SUT LAPRA TY ABSORB (SUTURE) ×3 IMPLANT
COVER SURGICAL LIGHT HANDLE (MISCELLANEOUS) ×2 IMPLANT
COVER TIP SHEARS 8 DVNC (MISCELLANEOUS) ×1 IMPLANT
COVER TIP SHEARS 8MM DA VINCI (MISCELLANEOUS) ×1
DECANTER SPIKE VIAL GLASS SM (MISCELLANEOUS) ×1 IMPLANT
DERMABOND ADVANCED (GAUZE/BANDAGES/DRESSINGS) ×1
DERMABOND ADVANCED .7 DNX12 (GAUZE/BANDAGES/DRESSINGS) ×1 IMPLANT
DISSECT BALLN SPACEMKR + OVL (BALLOONS) ×2
DISSECTOR BALLN SPACEMKR + OVL (BALLOONS) IMPLANT
DRAIN CHANNEL 15F RND FF 3/16 (WOUND CARE) ×2 IMPLANT
DRAPE ARM DVNC X/XI (DISPOSABLE) ×4 IMPLANT
DRAPE COLUMN DVNC XI (DISPOSABLE) ×1 IMPLANT
DRAPE DA VINCI XI ARM (DISPOSABLE) ×4
DRAPE DA VINCI XI COLUMN (DISPOSABLE) ×1
DRAPE INCISE IOBAN 66X45 STRL (DRAPES) ×2 IMPLANT
DRAPE LAPAROSCOPIC ABDOMINAL (DRAPES) ×1 IMPLANT
DRAPE SHEET LG 3/4 BI-LAMINATE (DRAPES) ×2 IMPLANT
DRSG TEGADERM 4X4.75 (GAUZE/BANDAGES/DRESSINGS) ×2 IMPLANT
ELECT PENCIL ROCKER SW 15FT (MISCELLANEOUS) ×2 IMPLANT
ELECT REM PT RETURN 15FT ADLT (MISCELLANEOUS) ×2 IMPLANT
EVACUATOR SILICONE 100CC (DRAIN) ×2 IMPLANT
GLOVE BIO SURGEON STRL SZ 6.5 (GLOVE) ×2 IMPLANT
GLOVE BIOGEL M STRL SZ7.5 (GLOVE) ×5 IMPLANT
GOWN STRL REUS W/TWL LRG LVL3 (GOWN DISPOSABLE) ×8 IMPLANT
HEMOSTAT SURGICEL 4X8 (HEMOSTASIS) ×2 IMPLANT
IRRIG SUCT STRYKERFLOW 2 WTIP (MISCELLANEOUS) ×2
IRRIGATION SUCT STRKRFLW 2 WTP (MISCELLANEOUS) IMPLANT
KIT BASIN OR (CUSTOM PROCEDURE TRAY) ×2 IMPLANT
LOOP VESSEL MAXI BLUE (MISCELLANEOUS) ×2 IMPLANT
MARKER SKIN DUAL TIP RULER LAB (MISCELLANEOUS) ×2 IMPLANT
NDL INSUFFLATION 14GA 120MM (NEEDLE) ×1 IMPLANT
NDL SAFETY ECLIPSE 18X1.5 (NEEDLE) ×1 IMPLANT
NEEDLE HYPO 18GX1.5 SHARP (NEEDLE) ×2
NEEDLE INSUFFLATION 14GA 120MM (NEEDLE) IMPLANT
NS IRRIG 1000ML POUR BTL (IV SOLUTION) ×1 IMPLANT
PORT ACCESS TROCAR AIRSEAL 12 (TROCAR) ×1 IMPLANT
PORT ACCESS TROCAR AIRSEAL 5M (TROCAR) ×1
POSITIONER SURGICAL ARM (MISCELLANEOUS) ×5 IMPLANT
POUCH SPECIMEN RETRIEVAL 10MM (ENDOMECHANICALS) ×2 IMPLANT
RELOAD STAPLE 60 2.6 WHT THN (STAPLE) IMPLANT
RELOAD STAPLER WHITE 60MM (STAPLE) IMPLANT
SEAL CANN UNIV 5-8 DVNC XI (MISCELLANEOUS) ×4 IMPLANT
SEAL XI 5MM-8MM UNIVERSAL (MISCELLANEOUS) ×4
SET TRI-LUMEN FLTR TB AIRSEAL (TUBING) ×2 IMPLANT
SOLUTION ELECTROLUBE (MISCELLANEOUS) ×2 IMPLANT
SPONGE LAP 4X18 X RAY DECT (DISPOSABLE) ×2 IMPLANT
STAPLE ECHEON FLEX 60 POW ENDO (STAPLE) IMPLANT
STAPLER RELOAD WHITE 60MM (STAPLE)
SURGIFLO W/THROMBIN 8M KIT (HEMOSTASIS) ×2 IMPLANT
SUT ETHILON 3 0 PS 1 (SUTURE) ×2 IMPLANT
SUT MNCRL AB 4-0 PS2 18 (SUTURE) ×4 IMPLANT
SUT PDS AB 1 CT1 27 (SUTURE) ×4 IMPLANT
SUT V-LOC BARB 180 2/0GR6 GS22 (SUTURE)
SUT VIC AB 0 CT1 27 (SUTURE) ×8
SUT VIC AB 0 CT1 27XBRD ANTBC (SUTURE) ×4 IMPLANT
SUT VIC AB 2-0 SH 27 (SUTURE) ×4
SUT VIC AB 2-0 SH 27X BRD (SUTURE) ×2 IMPLANT
SUT VLOC BARB 180 ABS3/0GR12 (SUTURE) ×4
SUTURE V-LC BRB 180 2/0GR6GS22 (SUTURE) IMPLANT
SUTURE VLOC BRB 180 ABS3/0GR12 (SUTURE) ×2 IMPLANT
TAPE STRIPS DRAPE STRL (GAUZE/BANDAGES/DRESSINGS) ×1 IMPLANT
TOWEL OR 17X26 10 PK STRL BLUE (TOWEL DISPOSABLE) ×3 IMPLANT
TOWEL OR NON WOVEN STRL DISP B (DISPOSABLE) ×2 IMPLANT
TRAY FOLEY W/METER SILVER 16FR (SET/KITS/TRAYS/PACK) ×2 IMPLANT
TRAY LAPAROSCOPIC (CUSTOM PROCEDURE TRAY) ×2 IMPLANT
TROCAR BLADELESS OPT 5 100 (ENDOMECHANICALS) ×1 IMPLANT
TROCAR XCEL 12X100 BLDLESS (ENDOMECHANICALS) ×2 IMPLANT
WATER STERILE IRR 1000ML POUR (IV SOLUTION) IMPLANT

## 2016-09-11 NOTE — Anesthesia Procedure Notes (Signed)
Procedure Name: Intubation Date/Time: 09/11/2016 10:03 AM Performed by: West Pugh Pre-anesthesia Checklist: Patient identified, Emergency Drugs available, Suction available, Patient being monitored and Timeout performed Patient Re-evaluated:Patient Re-evaluated prior to inductionOxygen Delivery Method: Circle system utilized Preoxygenation: Pre-oxygenation with 100% oxygen Intubation Type: IV induction Ventilation: Mask ventilation without difficulty and Oral airway inserted - appropriate to patient size Laryngoscope Size: Mac and 4 Grade View: Grade I Tube type: Oral Tube size: 7.5 mm Number of attempts: 1 Airway Equipment and Method: Stylet Placement Confirmation: ETT inserted through vocal cords under direct vision,  positive ETCO2,  CO2 detector and breath sounds checked- equal and bilateral Secured at: 22 cm Tube secured with: Tape Dental Injury: Teeth and Oropharynx as per pre-operative assessment

## 2016-09-11 NOTE — Anesthesia Preprocedure Evaluation (Signed)
Anesthesia Evaluation  Patient identified by MRN, date of birth, ID band Patient awake    Reviewed: Allergy & Precautions, NPO status , Patient's Chart, lab work & pertinent test results  Airway Mallampati: II       Dental no notable dental hx.    Pulmonary former smoker,    Pulmonary exam normal breath sounds clear to auscultation       Cardiovascular hypertension, Pt. on medications Normal cardiovascular exam Rhythm:Regular Rate:Normal     Neuro/Psych negative neurological ROS  negative psych ROS   GI/Hepatic negative GI ROS, Neg liver ROS,   Endo/Other  Hypothyroidism   Renal/GU negative Renal ROS  negative genitourinary   Musculoskeletal negative musculoskeletal ROS (+)   Abdominal (+) + obese,   Peds  Hematology negative hematology ROS (+)   Anesthesia Other Findings   Reproductive/Obstetrics                             Anesthesia Physical Anesthesia Plan  ASA: III  Anesthesia Plan: General   Post-op Pain Management:    Induction: Intravenous  Airway Management Planned: Oral ETT  Additional Equipment:   Intra-op Plan:   Post-operative Plan: Extubation in OR  Informed Consent: I have reviewed the patients History and Physical, chart, labs and discussed the procedure including the risks, benefits and alternatives for the proposed anesthesia with the patient or authorized representative who has indicated his/her understanding and acceptance.     Plan Discussed with: CRNA and Surgeon  Anesthesia Plan Comments:         Anesthesia Quick Evaluation

## 2016-09-11 NOTE — Progress Notes (Signed)
Urology Progress Note  Day of Surgery #0  Subjective: The patient is doing well.  No nausea or vomiting. Pain is adequately controlled.  Objective: Vital signs in last 24 hours: Temp:  [97.5 F (36.4 C)-98.8 F (37.1 C)] 98.8 F (37.1 C) (05/30 1447) Pulse Rate:  [79-90] 84 (05/30 1447) Resp:  [11-20] 14 (05/30 1447) BP: (118-150)/(70-85) 124/71 (05/30 1447) SpO2:  [95 %-100 %] 96 % (05/30 1447) Weight:  [119.9 kg (264 lb 5.3 oz)-121.1 kg (267 lb)] 119.9 kg (264 lb 5.3 oz) (05/30 1447)  Intake/Output from previous day: No intake/output data recorded. Intake/Output this shift: Total I/O In: 1600 [I.V.:1600] Out: 495 [Urine:350; Drains:45; Blood:100]  Physical Exam:  General: Alert and oriented. CV: RRR Lungs: Clear bilaterally. GI: Soft, Nondistended. Incisions: Clean, dry, and intact Urine: Clear Extremities: Nontender, no erythema, no edema.  Lab Results:  Recent Labs  09/11/16 1340  HGB 12.7*  HCT 38.3*      Assessment/Plan: POD#0 s/p rob RP right partial nephrectomy. Doing very well, no concerns. Continue routine post-op management.    LOS: 0 days   Catherin Doorn R 09/11/2016, 4:21 PM

## 2016-09-11 NOTE — Discharge Instructions (Signed)
1- Drain Sites - You may have some mild persistent drainage from old drain site for several days, this is normal. This can be covered with cotton gauze for convenience.  2 - Stiches - Your stitches are all dissolvable. You may notice a "loose thread" at your incisions, these are normal and require no intervention. You may cut them flush to the skin with fingernail clippers if needed for comfort.  3 - Diet - No restrictions  4 - Activity - No heavy lifting / straining (any activities that require valsalva or "bearing down") x 4 weeks. Otherwise, no restrictions.  5 - Bathing - You may shower immediately. Do not take a bath or get into swimming pool where incision sites are submersed in water x 4 weeks.   6 - When to Call the Doctor - Call MD for any fever >102, any acute wound problems, or any severe nausea / vomiting. You can call the [facility] [phone]) 24 hours a day 365 days a year. It will roll-over to the answering service and on-call physician after hours.  You may resume coumadin, aspirin, advil, aleve, vitamins, and supplements 7 days after surgery.

## 2016-09-11 NOTE — Brief Op Note (Signed)
09/11/2016  12:52 PM  PATIENT:  Michael Delgado  56 y.o. male  PRE-OPERATIVE DIAGNOSIS:  RIGHT RENAL MASS  POST-OPERATIVE DIAGNOSIS:  RIGHT RENAL MASS  PROCEDURE:  Procedure(s): XI ROBOTIC ASSISTED RETROPERITONEAL PARTIAL, POSSIBLE RADICAL NEPHRECTOMY (Right)  SURGEON:  Surgeon(s) and Role:    Alexis Frock, MD - Primary  PHYSICIAN ASSISTANT:   ASSISTANTS: Clemetine Marker PA   ANESTHESIA:   local and general  EBL:  Total I/O In: -  Out: 350 [Urine:250; Blood:100]  BLOOD ADMINISTERED:none  DRAINS: 1 - JP to bulb, 2 - Foley to gravity   LOCAL MEDICATIONS USED:  MARCAINE     SPECIMEN:  Source of Specimen:  1 - fat around renal mass, 2 - Rt partial nephrectomy  DISPOSITION OF SPECIMEN:  PATHOLOGY  COUNTS:  YES  TOURNIQUET:  * No tourniquets in log *  DICTATION: .Other Dictation: Dictation Number [id number]  PLAN OF CARE: Admit to inpatient   PATIENT DISPOSITION:  PACU - hemodynamically stable.   Delay start of Pharmacological VTE agent (>24hrs) due to surgical blood loss or risk of bleeding: yes

## 2016-09-11 NOTE — H&P (Signed)
Michael Delgado is an 56 y.o. male.    Chief Complaint: Pre-op RIGHT robotic partial v. Radical nephrectomy  HPI:   1 - Right Renal Mass - 1.5cm Rt upper posterior solid enhancing mass incidental on chest CT 05/2014. Was reccomended to have dedicated imaging but he cancelled x 2.   Recent Surveillance:  07/2016 MRI - 3cm Rt mass (doubled in size) clinically localized. 1 artery with early posterior branch / 1 vein right renovasculat anatomy. No contralateral lesions.   PMH sig for COPD (quit smoking), lupus anticoagulant / chornic coumadin, Hodgkins lymphoma / remission since 1980s (chemo-XRT to medaistinum), bowel resection after infarct x2 (large midline scar). His PCP is [name] MD with [facility] at [facility]. 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   Today " Michael Delgado" is seen to proceed with RIGHT robotic retroperitoneal partial v. Radical nephretomy for enlarging renal mass.    Past Medical History:  Diagnosis Date  . Erectile dysfunction   . Hodgkin's disease ([place])   . Hx of lupus anticoagulant disorder    history of blood work showing lupus  . Hyperlipidemia   . Hypertension   . Hypothyroidism   . Thyroid disease     Past Surgical History:  Procedure Laterality Date  . ABDOMINAL SURGERY      No family history on file. Social History:  reports that he quit smoking about 2 years ago. He has never used smokeless tobacco. He reports that he does not drink alcohol or use drugs.  Allergies: No Known Allergies  No prescriptions prior to admission.    No results found for this or any previous visit (from the past 48 hour(s)). No results found.  Review of Systems  Constitutional: Negative.  Negative for chills and fever.  HENT: Negative.   Eyes: Negative.   Respiratory: Negative.   Cardiovascular: Negative.   Gastrointestinal: Negative.   Genitourinary: Negative.  Negative for flank pain.  Musculoskeletal: Negative.   Skin: Negative.    Neurological: Negative.   Endo/Heme/Allergies: Negative.   Psychiatric/Behavioral: Negative.     There were no vitals taken for this visit. Physical Exam  Constitutional: He appears well-developed.  HENT:  Head: Normocephalic.  Eyes: Pupils are equal, round, and reactive to light.  Neck: Normal range of motion.  Cardiovascular: Normal rate.   Respiratory: Effort normal.  GI:  Midline scar w/o hernia.   Genitourinary:  Genitourinary Comments: No CVAT.   Musculoskeletal: Normal range of motion.  Neurological: He is alert.  Skin: Skin is warm.  Psychiatric: He has a normal mood and affect.     Assessment/Plan  Proceed as planned with RIGHT robotic retroperitoneal partial v. Radical nephrectomy. Risks, benefits, alternatives, expected peri-op course discussed previously and reiterated today.   Alexis Frock, MD 09/11/2016, 6:43 AM

## 2016-09-11 NOTE — Anesthesia Postprocedure Evaluation (Addendum)
Anesthesia Post Note  Patient: Herb Beltre  Procedure(s) Performed: Procedure(s) (LRB): XI ROBOTIC ASSISTED RETROPERITONEAL PARTIAL NEPHRECTOMY (Right)  Patient location during evaluation: PACU Anesthesia Type: General Level of consciousness: awake and sedated Pain management: pain level controlled Vital Signs Assessment: post-procedure vital signs reviewed and stable Respiratory status: spontaneous breathing Cardiovascular status: stable Postop Assessment: no signs of nausea or vomiting Anesthetic complications: no        Last Vitals:  Vitals:   09/11/16 1415 09/11/16 1430  BP: 123/76 118/70  Pulse: 82   Resp: 14   Temp: 36.8 C     Last Pain:  Vitals:   09/11/16 1430  TempSrc:   PainSc: 3    Pain Goal: Patients Stated Pain Goal: 3 (09/11/16 1400)               Syniyah Bourne JR,JOHN Marwin Primmer

## 2016-09-11 NOTE — Transfer of Care (Signed)
Immediate Anesthesia Transfer of Care Note  Patient: Michael Delgado  Procedure(s) Performed: Procedure(s): XI ROBOTIC ASSISTED RETROPERITONEAL PARTIAL NEPHRECTOMY (Right)  Patient Location: PACU  Anesthesia Type:General  Level of Consciousness:  sedated, patient cooperative and responds to stimulation  Airway & Oxygen Therapy:Patient Spontanous Breathing and Patient connected to face mask oxgen  Post-op Assessment:  Report given to PACU RN and Post -op Vital signs reviewed and stable  Post vital signs:  Reviewed and stable  Last Vitals:  Vitals:   09/11/16 0832  BP: (!) 144/85  Pulse: 90  Resp: 18  Temp: 63.8 C    Complications: No apparent anesthesia complications

## 2016-09-12 ENCOUNTER — Encounter (HOSPITAL_COMMUNITY): Payer: Self-pay | Admitting: Urology

## 2016-09-12 LAB — BASIC METABOLIC PANEL
ANION GAP: 7 (ref 5–15)
BUN: 18 mg/dL (ref 6–20)
CO2: 25 mmol/L (ref 22–32)
Calcium: 8 mg/dL — ABNORMAL LOW (ref 8.9–10.3)
Chloride: 104 mmol/L (ref 101–111)
Creatinine, Ser: 0.98 mg/dL (ref 0.61–1.24)
GFR calc non Af Amer: >60 mL/min (ref >60)
GLUCOSE: 145 mg/dL — AB (ref 65–99)
Potassium: 4.8 mmol/L (ref 3.5–5.1)
Sodium: 136 mmol/L (ref 135–145)

## 2016-09-12 LAB — HEMOGLOBIN AND HEMATOCRIT, BLOOD
HCT: 35.7 % — ABNORMAL LOW (ref 39.0–52.0)
Hemoglobin: 11.9 g/dL — ABNORMAL LOW (ref 13.0–17.0)

## 2016-09-12 NOTE — Care Management Note (Signed)
Case Management Note  Patient Details  Name: Michael Delgado MRN: [id number] Date of Birth: 06-15-1960  Subjective/Objective:                    Action/Plan:d/c home.   Expected Discharge Date:  09/12/16               Expected Discharge Plan:  Home/Self Care  In-House Referral:     Discharge planning Services  CM Consult  Post Acute Care Choice:    Choice offered to:     DME Arranged:    DME Agency:     HH Arranged:    HH Agency:     Status of Service:  Completed, signed off  If discussed at [employer] of Stay Meetings, dates discussed:    Additional Comments:  Dessa Phi, RN 09/12/2016, 1:46 PM

## 2016-09-12 NOTE — Discharge Summary (Signed)
Physician Discharge Summary  Patient ID: Michael Delgado MRN: [id number] DOB/AGE: 1961/01/18 56 y.o.  Admit date: 09/11/2016 Discharge date: 09/12/2016  Admission Diagnoses: RIGHT Renal mass  Discharge Diagnoses:  Active Problems:   Renal mass   Discharged Condition: good  Hospital Course: Pt underwent RIGHT robotic retroperitoneal partial nephrectomy on 09/11/16, the day of admission, without acute complication. By the afternoon of POD 1 he is ambulatory, pain controlled on PO meds, maintaining hydration, and felt to be adeuqate for discharge. JP removed prior to DC as output scant. Hgb >11, Cr <1.2 at discharge. Final pathology pending.   Consults: None  Significant Diagnostic Studies: labs: as per HPI  Treatments: surgery: as per HPI  Discharge Exam: Blood pressure 113/63, pulse 93, temperature 98.3 F (36.8 C), temperature source Oral, resp. rate 15, height 5\' 9"  (1.753 m), weight 119.9 kg (264 lb 5.3 oz), SpO2 100 %. General appearance: alert, cooperative and appears stated age Eyes: negative Nose: Nares normal. Septum midline. Mucosa normal. No drainage or sinus tenderness. Throat: lips, mucosa, and tongue normal; teeth and gums normal Neck: supple, symmetrical, trachea midline Back: symmetric, no curvature. ROM normal. No CVA tenderness. Resp: non-labored on room air.  Cardio: Nl rate GI: soft, non-tender; bowel sounds normal; no masses,  no organomegaly and stable moderate truncal obesity.  Male genitalia: normal Extremities: extremities normal, atraumatic, no cyanosis or edema Skin: Skin color, texture, turgor normal. No rashes or lesions Lymph nodes: Cervical, supraclavicular, and axillary nodes normal. Neurologic: Grossly normal Incision/Wound: Rt flank port and extraction sites c/d/i. JP removed, output serosasanguinous / non-foul, and dry dressing applied.   Disposition: 01-Home or Self Care    Follow-up Information    Alexis Frock, MD On 09/30/2016.    Specialty:  Urology Why:  at 64 AM for MD visit.  Contact information: [place] [address] [phone]           Signed: Alexis Frock 09/12/2016, 1:23 PM

## 2016-09-12 NOTE — Progress Notes (Signed)
Patient discharged home with wife, discharge instructions given and explained to patient/wife and they verbalized understanding, patient denies any pain/distress. Surgical incision clean/dry/intact, no wound noted. Accompanied home by wife, transported to the car by staff via wheelchair.

## 2016-09-12 NOTE — Care Management Note (Signed)
Case Management Note  Patient Details  Name: Nehemyah Foushee MRN: [id number] Date of Birth: [birth year]  Subjective/Objective:  56 y/o m admitted w/renal mass. From home.                  Action/Plan:d/c home.   Expected Discharge Date:                  Expected Discharge Plan:  Home/Self Care  In-House Referral:     Discharge planning Services  CM Consult  Post Acute Care Choice:    Choice offered to:     DME Arranged:    DME Agency:     HH Arranged:    HH Agency:     Status of Service:  In process, will continue to follow  If discussed at Long Length of Stay Meetings, dates discussed:    Additional Comments:  Dessa Phi, RN 09/12/2016, 11:55 AM

## 2016-09-12 NOTE — Op Note (Signed)
[name], [name] NO.:  [id number]  MEDICAL RECORD NO.:  [id number]  LOCATION:                                 FACILITY:  PHYSICIAN:  Alexis Frock, MD          DATE OF BIRTH:  DATE OF PROCEDURE:  09/11/2016                              OPERATIVE REPORT   DIAGNOSIS:  Enlarging right renal mass.  PROCEDURE:  Robotic-assisted laparoscopic right partial nephrectomy (retroperitoneal).  ESTIMATED BLOOD LOSS:  100 mL.  COMPLICATION:  None.  SPECIMEN: 1. Fat around right renal tumor. 2. Right robotic partial nephrectomy for permanent pathology.  ASSISTANT:  Debbrah Alar, PA.  WARM ISCHEMIA TIME: 21 minutes.  DRAINS: 1. Jackson-Pratt drain to bulb suction. 2. Foley catheter to straight drain.  FINDINGS: 1. Single artery, single vein, right renovascular anatomy as     anticipated. 2. Approximately 50% exophytic right superior posterior renal mass.  INDICATIONS:  Michael Delgado is a pleasant 56 year old gentleman who was found to have a right solid enhancing renal mass several years ago incidental on imaging.  He was counseled towards surveillance, but he failed to follow up times several years.  He then re-presented.  Follow up repeat imaging was obtained, which revealed a doubling of size of his renal mass.  It was clinically localized.  It was felt to be highly concerning for renal cell carcinoma.  Options were discussed for management including continued surveillance versus surgical extirpation versus ablative therapy.  He wished to proceed with surgical extirpation with minimal assistance and nephron sparing.  Given his extensive intraabdominal surgical history, it was felt that the retroperitoneal approach would be most advantageous.  Informed consent was obtained and placed in the medical record.  DESCRIPTION OF PROCEDURE:  The patient being, Michael Delgado, was verified.  Procedure being right retroperitoneal robotic partial nephrectomy was  confirmed.  Procedure was carried out.  Time-out was performed.  Intravenous antibiotics were administered.  General endotracheal anesthesia introduced.  The patient was placed into a right side up full flank position, applying 15 degrees of stable flexion, superior arm elevator, axillary roll, sequential compression devices, bottom leg bent, top leg straight.  Foley catheter had been placed per urethra to straight drain.  Sterile field was created by prepping and draping the patient's right flank using chlorhexidine gluconate.  Next, incision was made approximately 2.5 cm in length approximately 1 cm inferior posterior to the tip of the 12th rib and digital dissection was performed until the subcutaneous fat to the lumbodorsal fascia, which was pierced with the surgeon's finger anterior and posteriorly thus entering the retroperitoneal space.  The psoas muscle was easily palpated as was the lower pole of the kidney.  This was developed with the surgeon's fingers maximally as possible and through the same site, the Spacemaker balloon apparatus was carefully positioned behind the kidney and under laparoscopic vision inflated to total 30 pumps under direct vision, which resulted in excellent creation of retroperitoneal space.  Spacemaker balloon was taken down.  It was noted that port placement, which were all performed retroperitoneally with surgeon's finger guidance and direct vision as follows:  Right posterior medial 8 mm robotic port,  right paramedian 8 mm robotic port approximately 4 fingerbreadths lateral to the initial entrance site, 8 mm paramedian robotic port approximately 4 cm lateral to that, 12 mm air seal port approximately 4 cm inferior posterior to the entrance site, and a 5 mm assistant port, approximately 4 cm inferior medial to the entrance site. The Spacemaker camera port was placed through the entrance site with the balloon inflated as per specifications and a robotic  trocar was placed into this in a piggyback style and was then docked and passed through the electronic checks.  Initial attention was directed at development of identification of the hilum.  Using the psoas musculature as horizontal, dissection proceeded towards the area of suspected great vessels.  The inferior vena cava was quickly identified as was its junction with the renal vein.  This was carefully circumferentially mobilized and a single renal artery was seen and also circumferentially mobilized marked with vessel loop.  Attention was directed at identification of the mass as it was performed directly onto renal parenchyma from the posterior approach at the level of the hilum and the kidney defatted superiorly.  The superior inferior mass was clearly seen and it was estimated to be 50% exophytic as anticipated.  It was de-fatted with the overlying fat set aside for pathology labeled as fat overlying renal mass.  The lesion did appear amenable to partial nephrectomy.  Warm ischemia was then achieved by placing two bulldog clamps on the artery and partial nephrectomy performed using cold scissors keeping what appeared to be a rim of normal parenchyma with the partial nephrectomy specimen.  This set aside.  First-layer renorrhaphy was then performed using 3-0 V-Loc suture reapproximating several small venous sinuses and a 2nd layer was also performed with additional small venous sinuses resulted in excellent hemostatic control.  In this area, a bolster was applied, a Surgicel, and 3 interrupted Vicryl parenchymal apposition sutures were placed, which resulted in excellent parenchymal apposition.  The bulldog clamps were removed.  Total ischemia time approximately 21 minute and 10 mL of FloSeal was applied at the area of the partial nephrectomy bed. All needles were removed.  Sponge and needle counts were correct. Hemostasis appeared excellent.  Robot was then undocked.  A JP drain  was brought through the previous right paramedian robotic port site near the retroperitoneal cavity.  The entrance site and 12 mm sites were closed at the level of the fascia using figure-of-eight PDS.  All incision sites were infiltrated with dilute lyophilized Marcaine and closed at the level of the skin using subcuticular Monocryl, followed by Dermabond.  Procedure was then terminated.  The patient tolerated the procedure well.  There were no immediate periprocedural complications. The patient was taken to the Postanesthesia Care Unit in stable condition.  Please noted, surgical assistant, Debbrah Alar, was absolutely crucial for all robotic portions of the procedure today.  She provided invaluable suctioning, retraction, clip application, vascular clamping, without which, this would not be possible.    ______________________________ Alexis Frock, MD   ______________________________ Alexis Frock, MD    TM/MEDQ  D:  09/11/2016  T:  09/12/2016  Job:  [id number]

## 2016-09-20 NOTE — Addendum Note (Signed)
Addendum  created 09/20/16 1302 by Lyn Hollingshead, MD   Sign clinical note

## 2016-10-01 ENCOUNTER — Encounter (INDEPENDENT_AMBULATORY_CARE_PROVIDER_SITE_OTHER): Payer: Self-pay

## 2016-10-01 ENCOUNTER — Ambulatory Visit (INDEPENDENT_AMBULATORY_CARE_PROVIDER_SITE_OTHER): Payer: Managed Care, Other (non HMO) | Admitting: Cardiology

## 2016-10-01 ENCOUNTER — Encounter: Payer: Self-pay | Admitting: Cardiology

## 2016-10-01 DIAGNOSIS — I10 Essential (primary) hypertension: Secondary | ICD-10-CM

## 2016-10-01 DIAGNOSIS — E7800 Pure hypercholesterolemia, unspecified: Secondary | ICD-10-CM

## 2016-10-01 DIAGNOSIS — I350 Nonrheumatic aortic (valve) stenosis: Secondary | ICD-10-CM

## 2016-10-01 DIAGNOSIS — E785 Hyperlipidemia, unspecified: Secondary | ICD-10-CM | POA: Insufficient documentation

## 2016-10-01 NOTE — Progress Notes (Addendum)
Cardiology Office Note    Date:  10/01/2016   ID:  [name], DOB [birth year], MRN [id number]  PCP:  [name], MD  Cardiologist:  Fransico Him, MD   Chief Complaint  Patient presents with  . Aortic Stenosis    History of Present Illness:  Michael Delgado is a 56 y.o. male with a history of aortic stenosis, hodgkins lymphoma, HTN, hyperlipidemia and tobacco use.  He was followed at [facility] but has not been seen in several years for his AS.  He recently was diagnosed with Stage I renal cell CA and had a partial nephrectomy with no complications.  He had an echo in 2008 showing mild AS and a normal nuclear stress test.  He is doing well today.  He denies any chest pain or pressure, SOB, DOE (except with extreme heat), PND, orthopnea, dizziness, palpitations, claudication or syncope.  He occasionally has some mild LE edema when out in the heat.      Past Medical History:  Diagnosis Date  . Erectile dysfunction   . GERD (gastroesophageal reflux disease)   . History of bowel infarction   . Hodgkin's disease ([place])   . Hx of Hodgkin's lymphoma   . Hx of lupus anticoagulant disorder    history of blood work showing lupus  . Hyperlipidemia   . Hypertension   . Hypothyroidism   . Mild aortic stenosis   . Thyroid disease   . Tobacco dependency     Past Surgical History:  Procedure Laterality Date  . ABDOMINAL SURGERY    . BOWEL INFARCTION  1997   HAD SURGERY AND FOUND TO HAVE LUPUS ANTICOAGULANT  . HODGKIN  LYMPHOMA WITH RADIATION AND CHEMOTHERAPY  1984  . ROBOTIC ASSITED PARTIAL NEPHRECTOMY Right 09/11/2016   Procedure: XI ROBOTIC ASSISTED RETROPERITONEAL PARTIAL NEPHRECTOMY;  Surgeon: Alexis Frock, MD;  Location: [facility];  Service: Urology;  Laterality: Right;    Current Medications: Current Meds  Medication Sig  . acetaminophen (TYLENOL) 500 MG tablet Take 500 mg by mouth every 6 (six) hours as needed for headache.  . diphenhydramine-acetaminophen (TYLENOL  PM) 25-500 MG TABS tablet Take 1 tablet by mouth at bedtime.  Marland Kitchen HYDROcodone-acetaminophen (NORCO) 5-325 MG tablet Take 1-2 tablets by mouth every 6 (six) hours as needed for moderate pain or severe pain.  Marland Kitchen levothyroxine (SYNTHROID, LEVOTHROID) 50 MCG tablet Take 1 tablet by mouth daily before breakfast.   . Potassium 99 MG TABS Take 1 tablet by mouth daily.  . quinapril (ACCUPRIL) 20 MG tablet Take 20 mg by mouth 2 (two) times daily.  . simvastatin (ZOCOR) 40 MG tablet Take 40 mg by mouth every evening.    Allergies:   Patient has no known allergies.   Social History   Social History  . Marital status: Married    Spouse name: N/A  . Number of children: N/A  . Years of education: N/A   Social History Main Topics  . Smoking status: Former Smoker    Quit date: 06/12/2014  . Smokeless tobacco: Never Used  . Alcohol use No  . Drug use: No  . Sexual activity: Not Asked   Other Topics Concern  . None   Social History Narrative  . None     Family History:  The patient's family history includes Arthritis in his father, mother, and sister; Asthma in his sister; Cancer - Ovarian in his mother; Diabetes in his father; Healthy in his brother; Heart disease in his brother and sister; High  Cholesterol in his father and mother; Hypertension in his brother, father, and mother.   ROS:   Please see the history of present illness.    ROS All other systems reviewed and are negative.  No flowsheet data found.     PHYSICAL EXAM:   VS:  BP 126/82   Pulse 88   Ht 5\' 9"  (1.753 m)   Wt 264 lb (119.7 kg)   SpO2 95%   BMI 38.99 kg/m    GEN: Well nourished, well developed, in no acute distress  HEENT: normal  Neck: no JVD, carotid bruits, or masses Cardiac: RRR; no rubs, or gallops,no edema.  Intact distal pulses bilaterally.  2/6 late peaking systolic murmur at the RUSB to LLSB and carotid arteries.  There is no audible S2 Respiratory:  clear to auscultation bilaterally, normal work of  breathing GI: soft, nontender, nondistended, + BS MS: no deformity or atrophy  Skin: warm and dry, no rash Neuro:  Alert and Oriented x 3, Strength and sensation are intact Psych: euthymic mood, full affect  Wt Readings from Last 3 Encounters:  10/01/16 264 lb (119.7 kg)  09/11/16 264 lb 5.3 oz (119.9 kg)  09/03/16 267 lb 2 oz (121.2 kg)      Studies/Labs Reviewed:   EKG:  EKG is not ordered today.  The ekg ordered by PCP demonstrates NSR with no ST changes  Recent Labs: 09/03/2016: Platelets 269 09/12/2016: BUN 18; Creatinine, Ser 0.98; Hemoglobin 11.9; Potassium 4.8; Sodium 136   Lipid Panel No results found for: CHOL, TRIG, HDL, CHOLHDL, VLDL, LDLCALC, LDLDIRECT  Additional studies/ records that were reviewed today include:  Office notes from PCP    ASSESSMENT:    1. Nonrheumatic aortic valve stenosis   2. Benign essential HTN   3. Pure hypercholesterolemia      PLAN:  In order of problems listed above:  1. Nonrheumatic aortic stenosis - this was mild by echo in 2008 but by exam today it appears severe with loss of A2 component of the second heart sound and late peaking.  He is completely asymptomatic.  I will get an echo to reassess his AV and LVF. 2. HTN - Bp well controlled on exam today.  He will continue on ACE I. 3. Hyperlipidemia - this is followed by his PCP.    Medication Adjustments/Labs and Tests Ordered: Current medicines are reviewed at length with the patient today.  Concerns regarding medicines are outlined above.  Medication changes, Labs and Tests ordered today are listed in the Patient Instructions below.  There are no Patient Instructions on file for this visit.   Signed, Fransico Him, MD  10/01/2016 2:24 PM    [address], [place],   [address] Phone: [phone]; Fax: [phone]

## 2016-10-01 NOTE — Addendum Note (Signed)
Addended by: Harland German A on: 10/01/2016 02:40 PM   Modules accepted: Orders

## 2016-10-01 NOTE — Patient Instructions (Signed)

## 2016-10-07 ENCOUNTER — Ambulatory Visit (HOSPITAL_COMMUNITY): Payer: Managed Care, Other (non HMO) | Attending: Internal Medicine

## 2016-10-07 ENCOUNTER — Other Ambulatory Visit: Payer: Self-pay

## 2016-10-07 DIAGNOSIS — I071 Rheumatic tricuspid insufficiency: Secondary | ICD-10-CM | POA: Insufficient documentation

## 2016-10-07 DIAGNOSIS — E785 Hyperlipidemia, unspecified: Secondary | ICD-10-CM | POA: Diagnosis not present

## 2016-10-07 DIAGNOSIS — Z720 Tobacco use: Secondary | ICD-10-CM | POA: Diagnosis not present

## 2016-10-07 DIAGNOSIS — I350 Nonrheumatic aortic (valve) stenosis: Secondary | ICD-10-CM

## 2016-10-07 DIAGNOSIS — I10 Essential (primary) hypertension: Secondary | ICD-10-CM | POA: Diagnosis not present

## 2016-10-07 DIAGNOSIS — E669 Obesity, unspecified: Secondary | ICD-10-CM | POA: Diagnosis not present

## 2016-10-10 ENCOUNTER — Telehealth: Payer: Self-pay | Admitting: Cardiology

## 2016-10-10 NOTE — Telephone Encounter (Signed)
Follow Up:     Returning Michael Delgado's call from yesterday,concerning his Echo results.

## 2016-10-10 NOTE — Telephone Encounter (Signed)
Spoke to wife (ok per DPR)-aware of results, appt made for Thursday 7/5 at [place] with Kerin Ransom PA at [facility] office (no availabilities at [place] with APP or primary until 7/17).     Wife aware and verbalized understanding.

## 2016-10-17 ENCOUNTER — Ambulatory Visit (INDEPENDENT_AMBULATORY_CARE_PROVIDER_SITE_OTHER): Payer: Managed Care, Other (non HMO) | Admitting: Pharmacist

## 2016-10-17 ENCOUNTER — Encounter: Payer: Self-pay | Admitting: Cardiology

## 2016-10-17 ENCOUNTER — Ambulatory Visit (INDEPENDENT_AMBULATORY_CARE_PROVIDER_SITE_OTHER): Payer: Managed Care, Other (non HMO) | Admitting: Cardiology

## 2016-10-17 VITALS — BP 124/76 | HR 72 | Ht 69.5 in | Wt 263.6 lb

## 2016-10-17 DIAGNOSIS — Z8579 Personal history of other malignant neoplasms of lymphoid, hematopoietic and related tissues: Secondary | ICD-10-CM

## 2016-10-17 DIAGNOSIS — Z01818 Encounter for other preprocedural examination: Secondary | ICD-10-CM

## 2016-10-17 DIAGNOSIS — Z85528 Personal history of other malignant neoplasm of kidney: Secondary | ICD-10-CM | POA: Diagnosis not present

## 2016-10-17 DIAGNOSIS — I10 Essential (primary) hypertension: Secondary | ICD-10-CM

## 2016-10-17 DIAGNOSIS — I350 Nonrheumatic aortic (valve) stenosis: Secondary | ICD-10-CM | POA: Diagnosis not present

## 2016-10-17 DIAGNOSIS — Z7901 Long term (current) use of anticoagulants: Secondary | ICD-10-CM

## 2016-10-17 DIAGNOSIS — I824Y9 Acute embolism and thrombosis of unspecified deep veins of unspecified proximal lower extremity: Secondary | ICD-10-CM

## 2016-10-17 DIAGNOSIS — E669 Obesity, unspecified: Secondary | ICD-10-CM

## 2016-10-17 DIAGNOSIS — Z8571 Personal history of Hodgkin lymphoma: Secondary | ICD-10-CM | POA: Insufficient documentation

## 2016-10-17 LAB — BASIC METABOLIC PANEL
BUN/Creatinine Ratio: 20 (ref 9–20)
BUN: 18 mg/dL (ref 6–24)
CO2: 22 mmol/L (ref 20–29)
Calcium: 8.9 mg/dL (ref 8.7–10.2)
Chloride: 102 mmol/L (ref 96–106)
Creatinine, Ser: 0.91 mg/dL (ref 0.76–1.27)
GFR calc Af Amer: 109 mL/min/{1.73_m2} (ref >59)
GFR calc non Af Amer: 95 mL/min/{1.73_m2} (ref >59)
Glucose: 105 mg/dL — ABNORMAL HIGH (ref 65–99)
Potassium: 4.6 mmol/L (ref 3.5–5.2)
Sodium: 141 mmol/L (ref 134–144)

## 2016-10-17 LAB — PROTIME-INR
INR: 2.5 — ABNORMAL HIGH (ref 0.8–1.2)
Prothrombin Time: 25.4 s — ABNORMAL HIGH (ref 9.1–12.0)

## 2016-10-17 LAB — CBC
Hematocrit: 38.5 % (ref 37.5–51.0)
Hemoglobin: 12.7 g/dL — ABNORMAL LOW (ref 13.0–17.7)
MCH: 27.5 pg (ref 26.6–33.0)
MCHC: 33 g/dL (ref 31.5–35.7)
MCV: 84 fL (ref 79–97)
Platelets: 259 10*3/uL (ref 150–379)
RBC: 4.61 x10E6/uL (ref 4.14–5.80)
RDW: 14.1 % (ref 12.3–15.4)
WBC: 6.2 10*3/uL (ref 3.4–10.8)

## 2016-10-17 LAB — POCT INR: INR: 3

## 2016-10-17 MED ORDER — ENOXAPARIN SODIUM 150 MG/ML ~~LOC~~ SOLN: 150.0000 mg | SUBCUTANEOUS | 0 refills | Status: DC

## 2016-10-17 NOTE — Patient Instructions (Addendum)
July/5: Last dose of Coumadin.  July/6: No Coumadin or Lovenox.  July/7: Inject Lovenox 150 mg in the fatty abdominal tissue at least 2 inches from the belly button every morning . No Coumadin.  July/8: Inject Lovenox in the fatty tissue every morning. No Coumadin.  July/9:  Inject Lovenox in the fatty tissue every morning. No Coumadin.  July/10:  Inject Lovenox in the fatty tissue every morning. No Coumadin.  July/11 : Procedure Day - No Lovenox - Resume Coumadin in the evening or as directed by doctor (take an extra half tablet with usual dose for 2 days then resume normal dose).  July/12: Resume Lovenox inject in the fatty tissue every day and take Coumadin.  July/13: Inject Lovenox in the fatty tissue every morning and take Coumadin.  July/14:  Inject Lovenox in the fatty tissue every morning and take Coumadin.  July/15:  Inject Lovenox in the fatty tissue every morning and take Coumadin..  July/16:  Inject Lovenox in the fatty tissue every morning and take Coumadin.  July/17: Coumadin appt to check INR.

## 2016-10-17 NOTE — Assessment & Plan Note (Signed)
Followed by PCP

## 2016-10-17 NOTE — Patient Instructions (Addendum)
Medication Instructions: No changes  Labwork: Please have the following labs drawn today: CBC, BMET, INR    If you need a refill on your cardiac medications before your next appointment, please call your pharmacy.      [facility] [address] [place] [address] Dept: [phone] Loc: [place]  10/17/2016  You are scheduled for a Cardiac Catheterization on Wednesday, July 11 at 11:30am with Dr. Shelva Majestic.  1. Please arrive at the [facility] (Main Entrance A) at [facility]: [place], [place] [address] at 9:00 AM (two and a half hours before your procedure to ensure your preparation). Free valet parking service is available.   Special note: Every effort is made to have your procedure done on time. Please understand that emergencies sometimes delay scheduled procedures.  2. Diet: Do not eat or drink anything after midnight prior to your procedure except sips of water to take medications.  3. Labs: You will need to have blood drawn on Thursday, July 5 at [facility] [address], [place]  Open: 8am - 5pm (Lunch 12:30 - 1:30)  You do not need to be fasting.  4. Medication instructions in preparation for your procedure: Please follow pharmacy's instructions concerning the coumadin to lovenox bridge. Feel free to call the office if you have any questions.   5. Plan for one night stay--bring personal belongings. 6. Bring a current list of your medications and current insurance cards. 7. You MUST have a responsible person to drive you home. 8. Someone MUST be with you the first 24 hours after you arrive home or your discharge will be delayed. 9. Please wear clothes that are easy to get on and off and wear slip-on shoes.  Thank you for allowing [place] to care for you!   -- [facility]

## 2016-10-17 NOTE — Assessment & Plan Note (Signed)
History of chemotherapy and radiation therapy '84-'85

## 2016-10-17 NOTE — Assessment & Plan Note (Signed)
Pt seen today for pre cath evaluation

## 2016-10-17 NOTE — Assessment & Plan Note (Signed)
Controlled.  

## 2016-10-17 NOTE — Assessment & Plan Note (Signed)
BMI 38 

## 2016-10-17 NOTE — Progress Notes (Signed)
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 He had an echo in 2008 showing mild AS and a normal nuclear stress test. He was referred to Dr Radford Pax 10/01/16 for follow up of his AS. The pt does note increased DOE over the past few months. He denies chest pain or syncope. He recently was diagnosed with stage 1 renal cell cancer and had a partial nephrectomy 09/11/16 which he tolerated well. An echo done 10/07/16 showed severe AS with preserved LVF and he is here now to be seen prior to Rt and Lt heart cath.  The pt's past medical history is remarkable for a history of Hodgkin's Lymphoma.  He received radiation and chemotherapy in 1984-1985. This was complicated by radiation esophagitis and hypothyroidism. In 1997 he had an abdominal embolism with bowel infarction and had to have extensive bowel resection. He was diagnosed with Lupus anticoagulant then and has been on chronic Coumadin Rx with an INR goal of 2.5-3.5 (followed by Dr Lawernce Pitts). Of note he did not bridge for his nephrectomy.     Current Outpatient Prescriptions  Medication Sig Dispense Refill  . acetaminophen (TYLENOL) 500 MG tablet Take 500 mg by mouth every 6 (six) hours as needed for headache.    . diphenhydramine-acetaminophen (TYLENOL PM) 25-500 MG TABS tablet Take 1 tablet by mouth at bedtime.    Marland Kitchen HYDROcodone-acetaminophen (NORCO) 5-325 MG tablet Take 1-2 tablets by mouth every 6 (six) hours as needed for moderate pain or severe pain. 30 tablet 0  . levothyroxine (SYNTHROID, LEVOTHROID) 50 MCG tablet Take 1 tablet by mouth daily before breakfast.     . Potassium 99 MG TABS Take 1 tablet by mouth daily.    . quinapril (ACCUPRIL) 20 MG tablet Take 20 mg by mouth 2 (two) times daily.    . simvastatin  (ZOCOR) 40 MG tablet Take 40 mg by mouth every evening.     No current facility-administered medications for this visit.     No Known Allergies  Past Medical History:  Diagnosis Date  . Erectile dysfunction   . GERD (gastroesophageal reflux disease)   . History of bowel infarction   . Hodgkin's disease ([place])   . Hx of Hodgkin's lymphoma   . Hx of lupus anticoagulant disorder    history of blood work showing lupus  . Hyperlipidemia   . Hypertension   . Hypothyroidism   . Mild aortic stenosis   . Thyroid disease   . Tobacco dependency     Social History   Social History  . Marital status: Married    Spouse name: N/A  . Number of children: N/A  . Years of education: N/A   Occupational History  . Not on file.   Social History Main Topics  . Smoking status: Former Smoker    Quit date: 06/12/2014  . Smokeless tobacco: Never Used  . Alcohol use No  . Drug use: No  . Sexual activity: Not on file   Other Topics Concern  . Not on file   Social History Narrative  . No narrative on file     Family History  Problem Relation Age of Onset  . Arthritis Mother   . Hypertension Mother   . High Cholesterol Mother   . Cancer -  Ovarian Mother   . Diabetes Father   . Hypertension Father   . Arthritis Father   . High Cholesterol Father   . Arthritis Sister   . Heart disease Brother   . Hypertension Brother   . Healthy Brother   . Asthma Sister   . Heart disease Sister      Review of Systems: General: negative for chills, fever, night sweats or weight changes.  Cardiovascular: negative for chest pain, edema, orthopnea, palpitations, paroxysmal nocturnal dyspnea or shortness of breath Dermatological: negative for rash Respiratory: negative for cough or wheezing Urologic: negative for hematuria Abdominal: negative for nausea, vomiting, diarrhea, bright red blood per rectum, melena, or hematemesis Neurologic: negative for visual changes, syncope, or dizziness All  other systems reviewed and are otherwise negative except as noted above.    Blood pressure 124/76, pulse 72, height 5' 9.5" (1.765 m), weight 263 lb 9.6 oz (119.6 kg).  General appearance: alert, cooperative, no distress and moderately obese Neck: no JVD and AS murmur radiates to both carotids Lungs: clear to auscultation bilaterally Heart: regular rate and rhythm and 2/6 AS murmur, decreased S2 Abdomen: soft, non-tender; bowel sounds normal; no masses,  no organomegaly and mid line surgical scar Extremities: extremities normal, atraumatic, no cyanosis or edema Pulses: 2+ and symmetric Skin: Skin color, texture, turgor normal. No rashes or lesions Neurologic: Grossly normal  EKG NSR  ASSESSMENT AND PLAN:   Severe aortic stenosis Pt seen today for pre cath evaluation  Chronic anticoagulation Pt is on chronic Coumadin Rx for abdominal embolism and positive Lupus anticoagulant in the 90's INR goal is 2.5-3.5, followed by PCP  Essential hypertension Controlled  History of lymphoma History of chemotherapy and radiation therapy '84-'85  History of renal cell cancer History of chemotherapy and radiation therapy '84-'85  Dyslipidemia Followed by PCP  Obesity (BMI 30-39.9) BMI 38   PLAN  Discussed with Dr Debara Pickett in the office today. We will bridge with Lovenox prior to cath. The patient understands that risks included but are not limited to stroke (1 in 1000), death (1 in 65), kidney failure [usually temporary] (1 in 500), bleeding (1 in 200), allergic reaction [possibly serious] (1 in 200).  The patient understands and agrees to proceed.   Kerin Ransom PA-C 10/17/2016 8:47 AM

## 2016-10-17 NOTE — Assessment & Plan Note (Signed)
Pt is on chronic Coumadin Rx for abdominal embolism and positive Lupus anticoagulant in the 90's INR goal is 2.5-3.5, followed by PCP

## 2016-10-22 ENCOUNTER — Telehealth: Payer: Self-pay | Admitting: Cardiovascular Disease

## 2016-10-22 ENCOUNTER — Telehealth: Payer: Self-pay

## 2016-10-22 NOTE — Telephone Encounter (Signed)
New message     [facility] needs authorization # for procedure tomorrow

## 2016-10-22 NOTE — Telephone Encounter (Signed)
Spoke to billing, will address.  Routed to billing pool as high priority.

## 2016-10-22 NOTE — Telephone Encounter (Signed)
Patient contacted pre-catheterization at [facility] scheduled for:  10/23/2016 @ 1130 Verified arrival time and place:  [facility] @ 0930 Confirmed AM meds to be taken pre-cath with sip of water:  Advised to take ASA prior to arrival.  Per wife, Pt has been compliant with lovenox bridging. Confirmed patient has responsible person to drive home post procedure and observe patient for 24 hours:  wife Addl concerns:  None noted

## 2016-10-23 ENCOUNTER — Ambulatory Visit (HOSPITAL_COMMUNITY)
Admission: RE | Admit: 2016-10-23 | Discharge: 2016-10-23 | Disposition: A | Discharge status: Home / Self Care | Payer: Managed Care, Other (non HMO) | Source: Ambulatory Visit | Attending: Cardiovascular Disease | Admitting: Cardiovascular Disease

## 2016-10-23 ENCOUNTER — Encounter (HOSPITAL_COMMUNITY)
Admission: RE | Disposition: A | Discharge status: Home / Self Care | Payer: Self-pay | Source: Ambulatory Visit | Attending: Cardiovascular Disease

## 2016-10-23 DIAGNOSIS — Z8571 Personal history of Hodgkin lymphoma: Secondary | ICD-10-CM | POA: Insufficient documentation

## 2016-10-23 DIAGNOSIS — E785 Hyperlipidemia, unspecified: Secondary | ICD-10-CM | POA: Insufficient documentation

## 2016-10-23 DIAGNOSIS — Z9221 Personal history of antineoplastic chemotherapy: Secondary | ICD-10-CM | POA: Diagnosis not present

## 2016-10-23 DIAGNOSIS — Z87891 Personal history of nicotine dependence: Secondary | ICD-10-CM | POA: Insufficient documentation

## 2016-10-23 DIAGNOSIS — Z6838 Body mass index (BMI) 38.0-38.9, adult: Secondary | ICD-10-CM | POA: Insufficient documentation

## 2016-10-23 DIAGNOSIS — I10 Essential (primary) hypertension: Secondary | ICD-10-CM | POA: Diagnosis not present

## 2016-10-23 DIAGNOSIS — Z85528 Personal history of other malignant neoplasm of kidney: Secondary | ICD-10-CM | POA: Diagnosis not present

## 2016-10-23 DIAGNOSIS — K219 Gastro-esophageal reflux disease without esophagitis: Secondary | ICD-10-CM | POA: Diagnosis not present

## 2016-10-23 DIAGNOSIS — I272 Pulmonary hypertension, unspecified: Secondary | ICD-10-CM | POA: Insufficient documentation

## 2016-10-23 DIAGNOSIS — Z905 Acquired absence of kidney: Secondary | ICD-10-CM | POA: Insufficient documentation

## 2016-10-23 DIAGNOSIS — E039 Hypothyroidism, unspecified: Secondary | ICD-10-CM | POA: Diagnosis not present

## 2016-10-23 DIAGNOSIS — E669 Obesity, unspecified: Secondary | ICD-10-CM | POA: Insufficient documentation

## 2016-10-23 DIAGNOSIS — Z01818 Encounter for other preprocedural examination: Secondary | ICD-10-CM

## 2016-10-23 DIAGNOSIS — I352 Nonrheumatic aortic (valve) stenosis with insufficiency: Secondary | ICD-10-CM | POA: Insufficient documentation

## 2016-10-23 DIAGNOSIS — I2510 Atherosclerotic heart disease of native coronary artery without angina pectoris: Secondary | ICD-10-CM

## 2016-10-23 DIAGNOSIS — I350 Nonrheumatic aortic (valve) stenosis: Secondary | ICD-10-CM

## 2016-10-23 DIAGNOSIS — Z79899 Other long term (current) drug therapy: Secondary | ICD-10-CM | POA: Insufficient documentation

## 2016-10-23 DIAGNOSIS — Z7901 Long term (current) use of anticoagulants: Secondary | ICD-10-CM | POA: Diagnosis not present

## 2016-10-23 DIAGNOSIS — Z923 Personal history of irradiation: Secondary | ICD-10-CM | POA: Diagnosis not present

## 2016-10-23 HISTORY — PX: RIGHT/LEFT HEART CATH AND CORONARY ANGIOGRAPHY: CATH118266

## 2016-10-23 HISTORY — PX: THORACIC AORTOGRAM: CATH118269

## 2016-10-23 LAB — POCT I-STAT 3, ART BLOOD GAS (G3+)
BICARBONATE: 24.6 mmol/L (ref 20.0–28.0)
O2 SAT: 97 %
PCO2 ART: 39.1 mmHg (ref 32.0–48.0)
PO2 ART: 90 mmHg (ref 83.0–108.0)
TCO2: 26 mmol/L (ref 0–100)
pH, Arterial: 7.406 (ref 7.350–7.450)

## 2016-10-23 LAB — POCT I-STAT 3, VENOUS BLOOD GAS (G3P V)
ACID-BASE EXCESS: 2 mmol/L (ref 0.0–2.0)
Bicarbonate: 27.6 mmol/L (ref 20.0–28.0)
O2 SAT: 66 %
PH VEN: 7.381 (ref 7.250–7.430)
TCO2: 29 mmol/L (ref 0–100)
pCO2, Ven: 46.5 mmHg (ref 44.0–60.0)
pO2, Ven: 35 mmHg (ref 32.0–45.0)

## 2016-10-23 LAB — PROTIME-INR
INR: 1.1
Prothrombin Time: 14.2 seconds (ref 11.4–15.2)

## 2016-10-23 SURGERY — RIGHT/LEFT HEART CATH AND CORONARY ANGIOGRAPHY
Anesthesia: LOCAL

## 2016-10-23 MED ORDER — LIDOCAINE HCL 1 % IJ SOLN
INTRAMUSCULAR | Status: AC
  Filled 2016-10-23: qty 20

## 2016-10-23 MED ORDER — SODIUM CHLORIDE 0.9 % IV SOLN: 250.0000 mL | INTRAVENOUS | Status: DC | PRN

## 2016-10-23 MED ORDER — SODIUM CHLORIDE 0.9 % WEIGHT BASED INFUSION
3.0000 mL/kg/h | INTRAVENOUS | Status: DC
  Administered 2016-10-23: 3 mL/kg/h via INTRAVENOUS

## 2016-10-23 MED ORDER — ACETAMINOPHEN 325 MG PO TABS: 650.0000 mg | ORAL_TABLET | ORAL | Status: DC | PRN

## 2016-10-23 MED ORDER — ONDANSETRON HCL 4 MG/2ML IJ SOLN: 4.0000 mg | Freq: Four times a day (QID) | INTRAMUSCULAR | Status: DC | PRN

## 2016-10-23 MED ORDER — IOPAMIDOL (ISOVUE-370) INJECTION 76%
INTRAVENOUS | Status: DC | PRN
  Administered 2016-10-23: 100 mL via INTRA_ARTERIAL

## 2016-10-23 MED ORDER — SODIUM CHLORIDE 0.9% FLUSH: 3.0000 mL | Freq: Two times a day (BID) | INTRAVENOUS | Status: DC

## 2016-10-23 MED ORDER — SODIUM CHLORIDE 0.9% FLUSH: 3.0000 mL | INTRAVENOUS | Status: DC | PRN

## 2016-10-23 MED ORDER — ATORVASTATIN CALCIUM 80 MG PO TABS
80.0000 mg | ORAL_TABLET | Freq: Every day | ORAL | Status: DC
  Filled 2016-10-23: qty 1

## 2016-10-23 MED ORDER — FENTANYL CITRATE (PF) 100 MCG/2ML IJ SOLN
INTRAMUSCULAR | Status: DC | PRN
  Administered 2016-10-23: 50 ug via INTRAVENOUS

## 2016-10-23 MED ORDER — DIAZEPAM 5 MG PO TABS: 5.0000 mg | ORAL_TABLET | Freq: Four times a day (QID) | ORAL | Status: DC | PRN

## 2016-10-23 MED ORDER — HEPARIN (PORCINE) IN NACL 2-0.9 UNIT/ML-% IJ SOLN
INTRAMUSCULAR | Status: AC
  Filled 2016-10-23: qty 1000

## 2016-10-23 MED ORDER — IOPAMIDOL (ISOVUE-370) INJECTION 76%
INTRAVENOUS | Status: AC
  Filled 2016-10-23: qty 100

## 2016-10-23 MED ORDER — MIDAZOLAM HCL 2 MG/2ML IJ SOLN
INTRAMUSCULAR | Status: AC
  Filled 2016-10-23: qty 2

## 2016-10-23 MED ORDER — MIDAZOLAM HCL 2 MG/2ML IJ SOLN
INTRAMUSCULAR | Status: DC | PRN
  Administered 2016-10-23: 2 mg via INTRAVENOUS

## 2016-10-23 MED ORDER — VERAPAMIL HCL 2.5 MG/ML IV SOLN
INTRAVENOUS | Status: AC
  Filled 2016-10-23: qty 2

## 2016-10-23 MED ORDER — FENTANYL CITRATE (PF) 100 MCG/2ML IJ SOLN
INTRAMUSCULAR | Status: AC
  Filled 2016-10-23: qty 2

## 2016-10-23 MED ORDER — SODIUM CHLORIDE 0.9 % WEIGHT BASED INFUSION
1.0000 mL/kg/h | INTRAVENOUS | Status: DC
  Administered 2016-10-23: 1 mL/kg/h via INTRAVENOUS

## 2016-10-23 MED ORDER — METOPROLOL TARTRATE 25 MG PO TABS: 12.5000 mg | ORAL_TABLET | Freq: Two times a day (BID) | ORAL | 2 refills | Status: DC

## 2016-10-23 MED ORDER — HEPARIN (PORCINE) IN NACL 2-0.9 UNIT/ML-% IJ SOLN
INTRAMUSCULAR | Status: AC | PRN
  Administered 2016-10-23: 1000 mL

## 2016-10-23 MED ORDER — METOPROLOL TARTRATE 12.5 MG HALF TABLET
12.5000 mg | ORAL_TABLET | Freq: Two times a day (BID) | ORAL | Status: DC
  Filled 2016-10-23 (×2): qty 1

## 2016-10-23 MED ORDER — LIDOCAINE HCL (PF) 1 % IJ SOLN
INTRAMUSCULAR | Status: DC | PRN
  Administered 2016-10-23: 20 mL

## 2016-10-23 MED ORDER — SODIUM CHLORIDE 0.9 % IV SOLN: INTRAVENOUS | Status: AC

## 2016-10-23 SURGICAL SUPPLY — 13 items
CATH INFINITI 5FR MULTPACK ANG (CATHETERS) ×1 IMPLANT
CATH SWAN GANZ 7F STRAIGHT (CATHETERS) ×1 IMPLANT
KIT HEART LEFT (KITS) ×2 IMPLANT
PACK CARDIAC CATHETERIZATION (CUSTOM PROCEDURE TRAY) ×2 IMPLANT
SHEATH PINNACLE 5F 10CM (SHEATH) ×1 IMPLANT
SHEATH PINNACLE 7F 10CM (SHEATH) ×1 IMPLANT
SYR MEDRAD MARK V 150ML (SYRINGE) ×2 IMPLANT
TRANSDUCER W/STOPCOCK (MISCELLANEOUS) ×2 IMPLANT
TUBING ART PRESS 72  MALE/FEM (TUBING) ×1
TUBING ART PRESS 72 MALE/FEM (TUBING) IMPLANT
TUBING CIL FLEX 10 FLL-RA (TUBING) ×2 IMPLANT
WIRE EMERALD 3MM-J .035X150CM (WIRE) ×1 IMPLANT
WIRE EMERALD ST .035X260CM (WIRE) ×1 IMPLANT

## 2016-10-23 NOTE — Discharge Instructions (Signed)
Angiogram, Care After °This sheet gives you information about how to care for yourself after your procedure. Your health care provider may also give you more specific instructions. If you have problems or questions, contact your health care provider. °What can I expect after the procedure? °After the procedure, it is common to have bruising and tenderness at the catheter insertion area. °Follow these instructions at home: °Insertion site care  °· Follow instructions from your health care provider about how to take care of your insertion site. Make sure you: °¨ Wash your hands with soap and water before you change your bandage (dressing). If soap and water are not available, use hand sanitizer. °¨ Change your dressing as told by your health care provider. °¨ Leave stitches (sutures), skin glue, or adhesive strips in place. These skin closures may need to stay in place for 2 weeks or longer. If adhesive strip edges start to loosen and curl up, you may trim the loose edges. Do not remove adhesive strips completely unless your health care provider tells you to do that. °· Do not take baths, swim, or use a hot tub until your health care provider approves. °· You may shower 24-48 hours after the procedure or as told by your health care provider. °¨ Gently wash the site with plain soap and water. °¨ Pat the area dry with a clean towel. °¨ Do not rub the site. This may cause bleeding. °· Do not apply powder or lotion to the site. Keep the site clean and dry. °· Check your insertion site every day for signs of infection. Check for: °¨ Redness, swelling, or pain. °¨ Fluid or blood. °¨ Warmth. °¨ Pus or a bad smell. °Activity  °· Rest as told by your health care provider, usually for 1-2 days. °· Do not lift anything that is heavier than 10 lbs. (4.5 kg) or as told by your health care provider. °· Do not drive for 24 hours if you were given a medicine to help you relax (sedative). °· Do not drive or use heavy machinery while  taking prescription pain medicine. °General instructions  °· Return to your normal activities as told by your health care provider, usually in about a week. Ask your health care provider what activities are safe for you. °· If the catheter site starts bleeding, lie flat and put pressure on the site. If the bleeding does not stop, get help right away. This is a medical emergency. °· Drink enough fluid to keep your urine clear or pale yellow. This helps flush the contrast dye from your body. °· Take over-the-counter and prescription medicines only as told by your health care provider. °· Keep all follow-up visits as told by your health care provider. This is important. °Contact a health care provider if: °· You have a fever or chills. °· You have redness, swelling, or pain around your insertion site. °· You have fluid or blood coming from your insertion site. °· The insertion site feels warm to the touch. °· You have pus or a bad smell coming from your insertion site. °· You have bruising around the insertion site. °· You notice blood collecting in the tissue around the catheter site (hematoma). The hematoma may be painful to the touch. °Get help right away if: °· You have severe pain at the catheter insertion area. °· The catheter insertion area swells very fast. °· The catheter insertion area is bleeding, and the bleeding does not stop when you hold steady pressure on   the area. °· The area near or just beyond the catheter insertion site becomes pale, cool, tingly, or numb. °These symptoms may represent a serious problem that is an emergency. Do not wait to see if the symptoms will go away. Get medical help right away. Call your local emergency services (911 in the [place] Do not drive yourself to the hospital. °Summary °· After the procedure, it is common to have bruising and tenderness at the catheter insertion area. °· After the procedure, it is important to rest and drink plenty of fluids. °· Do not take baths,  swim, or use a hot tub until your health care provider says it is okay to do so. You may shower 24-48 hours after the procedure or as told by your health care provider. °· If the catheter site starts bleeding, lie flat and put pressure on the site. If the bleeding does not stop, get help right away. This is a medical emergency. °This information is not intended to replace advice given to you by your health care provider. Make sure you discuss any questions you have with your health care provider. °Document Released: 10/18/2004 Document Revised: 03/06/2016 Document Reviewed: 03/06/2016 °Elsevier Interactive Patient Education © 2017 Elsevier Inc. ° °

## 2016-10-23 NOTE — Progress Notes (Signed)
Patients wife states she was informed when to restart warfarin and lovenox inj by letter and dr.

## 2016-10-23 NOTE — Progress Notes (Addendum)
Site area: RFA/RFV Site Prior to Removal:  Level 0 Pressure Applied For:20 min Manual:  yes  Patient Status During Pull:  stable Post Pull Site:  Level 0 Post Pull Instructions Given: yes  Post Pull Pulses Present: palpable Dressing Applied:  tegaderm Bedrest begins @ 1031 till 1915 Comments:

## 2016-10-23 NOTE — Telephone Encounter (Signed)
Call re-routed to pre-cert pool.

## 2016-10-23 NOTE — H&P (View-Only) (Signed)
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 He had an echo in 2008 showing mild AS and a normal nuclear stress test. He was referred to Dr Michael Delgado 10/01/16 for follow up of his AS. The pt does note increased DOE over the past few months. He denies chest pain or syncope. He recently was diagnosed with stage 1 renal cell cancer and had a partial nephrectomy 09/11/16 which he tolerated well. An echo done 10/07/16 showed severe AS with preserved LVF and he is here now to be seen prior to Rt and Lt heart cath.  The pt's past medical history is remarkable for a history of Hodgkin's Lymphoma.  He received radiation and chemotherapy in 1984-1985. This was complicated by radiation esophagitis and hypothyroidism. In 1997 he had an abdominal embolism with bowel infarction and had to have extensive bowel resection. He was diagnosed with Lupus anticoagulant then and has been on chronic Coumadin Rx with an INR goal of 2.5-3.5 (followed by Dr Michael Delgado). Of note he did not bridge for his nephrectomy.     Current Outpatient Prescriptions  Medication Sig Dispense Refill  . acetaminophen (TYLENOL) 500 MG tablet Take 500 mg by mouth every 6 (six) hours as needed for headache.    . diphenhydramine-acetaminophen (TYLENOL PM) 25-500 MG TABS tablet Take 1 tablet by mouth at bedtime.    Marland Kitchen HYDROcodone-acetaminophen (NORCO) 5-325 MG tablet Take 1-2 tablets by mouth every 6 (six) hours as needed for moderate pain or severe pain. 30 tablet 0  . levothyroxine (SYNTHROID, LEVOTHROID) 50 MCG tablet Take 1 tablet by mouth daily before breakfast.     . Potassium 99 MG TABS Take 1 tablet by mouth daily.    . quinapril (ACCUPRIL) 20 MG tablet Take 20 mg by mouth 2 (two) times daily.    . simvastatin  (ZOCOR) 40 MG tablet Take 40 mg by mouth every evening.     No current facility-administered medications for this visit.     No Known Allergies  Past Medical History:  Diagnosis Date  . Erectile dysfunction   . GERD (gastroesophageal reflux disease)   . History of bowel infarction   . Hodgkin's disease ([place])   . Hx of Hodgkin's lymphoma   . Hx of lupus anticoagulant disorder    history of blood work showing lupus  . Hyperlipidemia   . Hypertension   . Hypothyroidism   . Mild aortic stenosis   . Thyroid disease   . Tobacco dependency     Social History   Social History  . Marital status: Married    Spouse name: N/A  . Number of children: N/A  . Years of education: N/A   Occupational History  . Not on file.   Social History Main Topics  . Smoking status: Former Smoker    Quit date: 06/12/2014  . Smokeless tobacco: Never Used  . Alcohol use No  . Drug use: No  . Sexual activity: Not on file   Other Topics Concern  . Not on file   Social History Narrative  . No narrative on file     Family History  Problem Relation Age of Onset  . Arthritis Mother   . Hypertension Mother   . High Cholesterol Mother   . Cancer -  Ovarian Mother   . Diabetes Father   . Hypertension Father   . Arthritis Father   . High Cholesterol Father   . Arthritis Sister   . Heart disease Brother   . Hypertension Brother   . Healthy Brother   . Asthma Sister   . Heart disease Sister      Review of Systems: General: negative for chills, fever, night sweats or weight changes.  Cardiovascular: negative for chest pain, edema, orthopnea, palpitations, paroxysmal nocturnal dyspnea or shortness of breath Dermatological: negative for rash Respiratory: negative for cough or wheezing Urologic: negative for hematuria Abdominal: negative for nausea, vomiting, diarrhea, bright red blood per rectum, melena, or hematemesis Neurologic: negative for visual changes, syncope, or dizziness All  other systems reviewed and are otherwise negative except as noted above.    Blood pressure 124/76, pulse 72, height 5' 9.5" (1.765 m), weight 263 lb 9.6 oz (119.6 kg).  General appearance: alert, cooperative, no distress and moderately obese Neck: no JVD and AS murmur radiates to both carotids Lungs: clear to auscultation bilaterally Heart: regular rate and rhythm and 2/6 AS murmur, decreased S2 Abdomen: soft, non-tender; bowel sounds normal; no masses,  no organomegaly and mid line surgical scar Extremities: extremities normal, atraumatic, no cyanosis or edema Pulses: 2+ and symmetric Skin: Skin color, texture, turgor normal. No rashes or lesions Neurologic: Grossly normal  EKG NSR  ASSESSMENT AND PLAN:   Severe aortic stenosis Pt seen today for pre cath evaluation  Chronic anticoagulation Pt is on chronic Coumadin Rx for abdominal embolism and positive Lupus anticoagulant in the 90's INR goal is 2.5-3.5, followed by PCP  Essential hypertension Controlled  History of lymphoma History of chemotherapy and radiation therapy '84-'85  History of renal cell cancer History of chemotherapy and radiation therapy '84-'85  Dyslipidemia Followed by PCP  Obesity (BMI 30-39.9) BMI 38   PLAN  Discussed with Dr Michael Delgado in the office today. We will bridge with Lovenox prior to cath. The patient understands that risks included but are not limited to stroke (1 in 1000), death (1 in 72), kidney failure [usually temporary] (1 in 500), bleeding (1 in 200), allergic reaction [possibly serious] (1 in 200).  The patient understands and agrees to proceed.   Michael Ransom PA-C 10/17/2016 8:47 AM

## 2016-10-23 NOTE — Interval H&P Note (Signed)
History and Physical Interval Note:  10/23/2016 1:20 PM  Michael Delgado  has presented today for surgery, with the diagnosis of abnormal echo, sevCath Lab Visit (complete for each Cath Lab visit)  Clinical Evaluation Leading to the Procedure:   ACS: No.  Non-ACS:    Anginal Classification: CCS III  Anti-ischemic medical therapy: No Therapy  Non-Invasive Test Results: No non-invasive testing performed  Prior CABG: No previous CABG      ere aortic stenosis  The various methods of treatment have been discussed with the patient and family. After consideration of risks, benefits and other options for treatment, the patient has consented to  Procedure(s): Right/Left Heart Cath and Coronary Angiography (N/A) as a surgical intervention .  The patient's history has been reviewed, patient examined, no change in status, stable for surgery.  I have reviewed the patient's chart and labs.  Questions were answered to the patient's satisfaction.     Shelva Majestic

## 2016-10-24 ENCOUNTER — Encounter (HOSPITAL_COMMUNITY): Payer: Self-pay | Admitting: Cardiovascular Disease

## 2016-10-24 ENCOUNTER — Telehealth: Payer: Self-pay

## 2016-10-24 DIAGNOSIS — I2510 Atherosclerotic heart disease of native coronary artery without angina pectoris: Secondary | ICD-10-CM

## 2016-10-24 DIAGNOSIS — I350 Nonrheumatic aortic (valve) stenosis: Secondary | ICD-10-CM

## 2016-10-24 NOTE — Telephone Encounter (Signed)
-----   Message from Sueanne Margarita, MD sent at 10/24/2016  3:40 PM EDT ----- Please set patient up to see Dr. Ricard Dillon for AVR and 1 vessel CABG  Traci ----- Message ----- From: Troy Sine, MD Sent: 10/23/2016   6:53 PM To: Sueanne Margarita, MD

## 2016-10-24 NOTE — Telephone Encounter (Signed)
Left message to call back  

## 2016-10-25 NOTE — Telephone Encounter (Signed)
Patient agrees to referral. [facility] referral placed for scheduling. Patient agrees with treatment plan.

## 2016-10-25 NOTE — Telephone Encounter (Signed)
New Message  Mrs. Drudge verbalized that she is returning call for rn

## 2016-10-28 NOTE — Telephone Encounter (Signed)
Patient has been scheduled 7/18 with Dr. Roxy Manns.

## 2016-10-29 ENCOUNTER — Ambulatory Visit (INDEPENDENT_AMBULATORY_CARE_PROVIDER_SITE_OTHER): Payer: Managed Care, Other (non HMO) | Admitting: Pharmacist

## 2016-10-29 DIAGNOSIS — I824Y9 Acute embolism and thrombosis of unspecified deep veins of unspecified proximal lower extremity: Secondary | ICD-10-CM

## 2016-10-29 LAB — POCT INR: INR: 1.8

## 2016-10-30 ENCOUNTER — Other Ambulatory Visit: Payer: Self-pay | Admitting: *Deleted

## 2016-10-30 ENCOUNTER — Encounter: Payer: Self-pay | Admitting: *Deleted

## 2016-10-30 ENCOUNTER — Encounter: Payer: Self-pay | Admitting: Thoracic Surgery (Cardiothoracic Vascular Surgery)

## 2016-10-30 ENCOUNTER — Institutional Professional Consult (permissible substitution) (INDEPENDENT_AMBULATORY_CARE_PROVIDER_SITE_OTHER): Payer: Managed Care, Other (non HMO) | Admitting: Thoracic Surgery (Cardiothoracic Vascular Surgery)

## 2016-10-30 VITALS — BP 139/79 | Temp 89.0°F | Resp 16 | Ht 69.0 in | Wt 263.0 lb

## 2016-10-30 DIAGNOSIS — I350 Nonrheumatic aortic (valve) stenosis: Secondary | ICD-10-CM

## 2016-10-30 DIAGNOSIS — Z7901 Long term (current) use of anticoagulants: Secondary | ICD-10-CM

## 2016-10-30 DIAGNOSIS — I2510 Atherosclerotic heart disease of native coronary artery without angina pectoris: Secondary | ICD-10-CM | POA: Diagnosis not present

## 2016-10-30 NOTE — Patient Instructions (Addendum)
Stop taking Coumadin (warfarin) on August 11  Continue taking all other medications without change through the day before surgery.  Have nothing to eat or drink after midnight the night before surgery.  On the morning of surgery take only metoprolol and synthroid with a sip of water.  Avoid any and all strenuous activities and contact Dr Theodosia Blender office if you develop increased shortness of breath and/or chest discomfort

## 2016-10-30 NOTE — Progress Notes (Signed)
[address]       [place] [address]             [phone]     CARDIOTHORACIC SURGERY CONSULTATION REPORT  Referring Provider is Sueanne Margarita, MD PCP is [name], MD  Chief Complaint  Patient presents with  . Aortic Stenosis    Surgical eval, Cardiac Cath 10/23/2016, ECHO 10/07/16,   . Coronary Artery Disease    HPI:  Patient is a 56 year old male with history of aortic stenosis, hypertension, hyperlipidemia, lupus anticoagulant positive on long-term warfarin anticoagulation, Hodgkin's lymphoma status post chemotherapy and mantle radiation therapy to the chest, hypothyroidism, GE reflux disease, and remote history of tobacco abuse who has been referred for surgical consultation to discuss treatment options for management of severe symptomatic aortic stenosis and coronary artery disease. The patient states that he was noted to have a heart murmur on physical exam many years ago by his primary care physician. He was referred for cardiology consultation and evaluated by Dr. Radford Pax in the past. Echocardiogram performed in 2008 revealed mild aortic stenosis. Despite encouragement from the patient's primary care physician, patient did not return to see Dr. Radford Pax for follow up until recently. Over the past year the patient has developed progressive symptoms of exertional shortness of breath and chest discomfort. In May the patient underwent uncomplicated partial left nephrectomy for early-stage renal cell carcinoma. Since then the patient has experienced further increase in symptoms of exertional shortness of breath. He was seen in follow-up by Dr. Radford Pax and echocardiogram performed 10/07/2016 revealed severe aortic stenosis with normal left ventricular systolic function. Peak velocity across the aortic valve measured 4.3 m/s corresponding to mean transvalvular gradient estimated 41 mmHg.  There was moderate aortic insufficiency. Ejection fraction was estimated  60-65%.  Diagnostic cardiac catheterization was performed 10/23/2016 and notable for the presence of severe aortic stenosis and single-vessel coronary artery disease. Mean transvalvular gradient across the aortic valve was measured 44 mmHg corresponding to aortic valve area calculated 0.98 cm. Pulmonary artery pressures were mildly elevated. Coronary angiography revealed tubular 70% ostial stenosis of the right coronary artery, 80% proximal stenosis of an acute marginal branch, and 100% occlusion of the terminal torsion the right coronary artery beyond the posterior descending coronary artery and prior to a small posterolateral branch.  There was no significant obstructive disease in the left coronary circulation.  The patient was referred for elective surgical consultation.  The patient is married and lives with his wife in [place].  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 His job requires long hours but does not require strenuous physical exertion.  The patient has remained physically active and functionally independent all of his life. He admits that over the past year he has slowed down considerably with progressive symptoms of exertional shortness of breath and fatigue. He tires easily. He now gets short of breath with moderate activity, such as walking up a flight of stairs. This is limiting his physical activities considerably. He occasionally gets substernal chest tightness in association with shortness of breath. In the last few weeks he has had some difficulty lying flat in bed and occasional episodes of PND. He has a dry nonproductive cough. He has not had any prolonged episodes of chest pain or shortness of breath. He has not had any dizzy spells or syncope.  The patient has been chronically anticoagulated using warfarin for more than 20 years. He originally  presented with ischemic small bowel secondary to embolization requiring laparotomy for bowel  resection. He was diagnosed with lupus anticoagulant hypercoagulable state. He has not had any problems or complications with long-term warfarin anticoagulation.    Past Medical History:  Diagnosis Date  . Chronic anticoagulation    INR goal 2.5-3.5  . Coronary artery disease involving native coronary artery of native heart without angina pectoris   . Erectile dysfunction   . GERD (gastroesophageal reflux disease)   . History of bowel infarction   . Hx of Hodgkin's lymphoma   . Hx of lupus anticoagulant disorder    history of blood work showing lupus  . Hyperlipidemia   . Hypertension   . Hypothyroidism   . Severe aortic stenosis   . Thyroid disease   . Tobacco dependency     Past Surgical History:  Procedure Laterality Date  . ABDOMINAL SURGERY    . BOWEL INFARCTION  1997   HAD SURGERY AND FOUND TO HAVE LUPUS ANTICOAGULANT  . HODGKIN  LYMPHOMA WITH RADIATION AND CHEMOTHERAPY  1984  . RIGHT/LEFT HEART CATH AND CORONARY ANGIOGRAPHY N/A 10/23/2016   Procedure: Right/Left Heart Cath and Coronary Angiography;  Surgeon: Troy Sine, MD;  Location: [facility];  Service: Cardiovascular;  Laterality: N/A;  . ROBOTIC ASSITED PARTIAL NEPHRECTOMY Right 09/11/2016   Procedure: XI ROBOTIC ASSISTED RETROPERITONEAL PARTIAL NEPHRECTOMY;  Surgeon: Alexis Frock, MD;  Location: [facility];  Service: Urology;  Laterality: Right;  . THORACIC AORTOGRAM N/A 10/23/2016   Procedure: Thoracic Aortogram;  Surgeon: Troy Sine, MD;  Location: [facility];  Service: Cardiovascular;  Laterality: N/A;    Family History  Problem Relation Age of Onset  . Arthritis Mother   . Hypertension Mother   . High Cholesterol Mother   . Cancer - Ovarian Mother   . Diabetes Father   . Hypertension Father   . Arthritis Father   . High Cholesterol Father   . Arthritis Sister   . Heart disease Brother   . Hypertension Brother   . Healthy Brother   . Asthma Sister   . Heart disease Sister      Social History   Social History  . Marital status: Married    Spouse name: N/A  . Number of children: N/A  . Years of education: N/A   Occupational History  . Not on file.   Social History Main Topics  . Smoking status: Former Smoker    Quit date: 06/12/2014  . Smokeless tobacco: Never Used  . Alcohol use No  . Drug use: No  . Sexual activity: Not on file   Other Topics Concern  . Not on file   Social History Narrative  . No narrative on file    Current Outpatient Prescriptions  Medication Sig Dispense Refill  . acetaminophen (TYLENOL) 500 MG tablet Take 500 mg by mouth every 6 (six) hours as needed for headache.    Marland Kitchen aspirin EC 81 MG tablet Take 81 mg by mouth once.    . diphenhydramine-acetaminophen (TYLENOL PM) 25-500 MG TABS tablet Take 1 tablet by mouth at bedtime.    Marland Kitchen levothyroxine (SYNTHROID, LEVOTHROID) 50 MCG tablet Take 1 tablet by mouth daily before breakfast.     . metoprolol tartrate (LOPRESSOR) 25 MG tablet Take 0.5 tablets (12.5 mg total) by mouth 2 (two) times daily. 60 tablet 2  . Omega-3 Fatty Acids (FISH OIL) 1000 MG CAPS Take 3 capsules by mouth at bedtime.    Marland Kitchen  Potassium 99 MG TABS Take 1 tablet by mouth daily.    . quinapril (ACCUPRIL) 20 MG tablet Take 20 mg by mouth 2 (two) times daily.    . simvastatin (ZOCOR) 40 MG tablet Take 40 mg by mouth every evening.    . warfarin (COUMADIN) 5 MG tablet Take 5 mg by mouth daily.     No current facility-administered medications for this visit.     No Known Allergies    Review of Systems:   General:  normal appetite, decreased energy, no weight gain, no weight loss, no fever  Cardiac:  + chest pain with exertion, no chest pain at rest, + SOB with exertion, no resting SOB, + PND, + orthopnea, no palpitations, no arrhythmia, no atrial fibrillation, + slight LE edema, no dizzy spells, no syncope  Respiratory:  + shortness of breath, no home oxygen, no productive cough, + dry cough, no bronchitis, no  wheezing, no hemoptysis, no asthma, no pain with inspiration or cough, no sleep apnea, no CPAP at night  GI:   no difficulty swallowing, + reflux, + frequent heartburn, no hiatal hernia, no abdominal pain, no constipation, no diarrhea, no hematochezia, no hematemesis, no melena  GU:   no dysuria,  no frequency, no urinary tract infection, no hematuria, no enlarged prostate, no kidney stones, + kidney disease - recent partial nephrectomy  Vascular:  no pain suggestive of claudication, no pain in feet, + leg cramps, + varicose veins, no DVT, no non-healing foot ulcer  Neuro:   no stroke, no TIA's, no seizures, no headaches, no temporary blindness one eye,  no slurred speech, no peripheral neuropathy, no chronic pain, no instability of gait, no memory/cognitive dysfunction  Musculoskeletal: no arthritis, no joint swelling, no myalgias, no difficulty walking, normal mobility   Skin:   no rash, no itching, no skin infections, no pressure sores or ulcerations  Psych:   no anxiety, no depression, no nervousness, no unusual recent stress  Eyes:   no blurry vision, no floaters, no recent vision changes, + wears glasses for reading only  ENT:   no hearing loss, edentulous w/ full dentures  Hematologic:  no easy bruising, + abnormal bleeding, + clotting disorder, no frequent epistaxis  Endocrine:  no diabetes, does not check CBG's at home     Physical Exam:   BP 139/79 (BP Location: Left Arm, Patient Position: Sitting, Cuff Size: Large)   Temp (!) 89 F (31.7 C)   Resp 16   Ht 5\' 9"  (1.753 m)   Wt 263 lb (119.3 kg)   SpO2 95% Comment: RA  BMI 38.84 kg/m   General:  Moderately obese,  well-appearing  HEENT:  Unremarkable   Neck:   no JVD, no bruits, no adenopathy   Chest:   clear to auscultation, symmetrical breath sounds, no wheezes, no rhonchi   CV:   RRR, grade IV/VI harsh crescendo/decrescendo systolic murmur   Abdomen:  soft, non-tender, no masses   Extremities:  warm, well-perfused, pulses  diminished, no LE edema  Rectal/GU  Deferred  Neuro:   Grossly non-focal and symmetrical throughout  Skin:   Clean and dry, no rashes, no breakdown   Diagnostic Tests:  Transthoracic Echocardiography  Patient:    Michael Delgado, Michael Delgado MR #:       [id number] Study Date: 10/07/2016 Gender:     M Age:        55 Height:     175.3 cm Weight:     119.8 kg BSA:  2.47 m^2 Pt. Status: Room:   ATTENDING    Fransico Him, MD  ORDERING     Fransico Him, MD  REFERRING    Fransico Him, MD  SONOGRAPHER  Oletta Lamas, Will  PERFORMING   [facility], Outpatient  cc:  ------------------------------------------------------------------- LV EF: 60% -   65%  ------------------------------------------------------------------- Indications:      (I35.0).  ------------------------------------------------------------------- History:   PMH:  Acquired from the patient and from the patient&'s chart.  Aortic stenosis.  Aortic valve disease.  Risk factors: Current tobacco use. Hypertension. Obese. Dyslipidemia.  ------------------------------------------------------------------- Study Conclusions  - Left ventricle: The cavity size was normal. Wall thickness was   increased in a pattern of mild LVH. Systolic function was normal.   The estimated ejection fraction was in the range of 60% to 65%. - Aortic valve: AV is thickened, calcified with restricted motion.   Peak and mean gradients through the valve are 73 and 42 mm Hg   respectively consistent with severe AS. There was moderate   regurgitation. - Left atrium: The atrium was mildly dilated. - Pulmonary arteries: PA peak pressure: 36 mm Hg (S).  ------------------------------------------------------------------- Study data:  No prior study was available for comparison.  Study status:  Routine.  Procedure:  The patient reported no pain pre or post test. Transthoracic echocardiography for left ventricular function evaluation and for assessment of  valvular function. Image quality was adequate.  Study completion:  There were no complications.          Transthoracic echocardiography.  M-mode, complete 2D, spectral Doppler, and color Doppler.  Birthdate: Patient birthdate: 1960-05-12.  Age:  Patient is 56 yr old.  Sex: Gender: male.    BMI: 39 kg/m^2.  Blood pressure:     126/82 Patient status:  Outpatient.  Study date:  Study date: 10/07/2016. Study time: 07:45 AM.  Location:  [facility]  -------------------------------------------------------------------  ------------------------------------------------------------------- Left ventricle:  The cavity size was normal. Wall thickness was increased in a pattern of mild LVH. Systolic function was normal. The estimated ejection fraction was in the range of 60% to 65%.  ------------------------------------------------------------------- Aortic valve:  AV is thickened, calcified with restricted motion. Peak and mean gradients through the valve are 73 and 42 mm Hg respectively consistent with severe AS.  Doppler:  There was moderate regurgitation.    VTI ratio of LVOT to aortic valve: 0.23. Valve area (VTI): 0.73 cm^2. Indexed valve area (VTI): 0.3 cm^2/m^2. Peak velocity ratio of LVOT to aortic valve: 0.23. Valve area (Vmax): 0.74 cm^2. Indexed valve area (Vmax): 0.3 cm^2/m^2. Mean velocity ratio of LVOT to aortic valve: 0.21. Valve area (Vmean): 0.65 cm^2. Indexed valve area (Vmean): 0.26 cm^2/m^2. Mean gradient (S): 41 mm Hg. Peak gradient (S): 73 mm Hg.  ------------------------------------------------------------------- Mitral valve:   Moderately calcified annulus. Mildly thickened leaflets .  Doppler:  There was trivial regurgitation.    Valve area by pressure half-time: 4.07 cm^2. Indexed valve area by pressure half-time: 1.65 cm^2/m^2. Valve area by continuity equation (using LVOT flow): 1.48 cm^2. Indexed valve area by continuity equation (using LVOT flow): 0.6  cm^2/m^2.    Mean gradient (D): 8 mm Hg. Peak gradient (D): 13 mm Hg.  ------------------------------------------------------------------- Left atrium:  The atrium was mildly dilated.  ------------------------------------------------------------------- Right ventricle:  The cavity size was normal. Wall thickness was normal. Systolic function was normal.  ------------------------------------------------------------------- Pulmonic valve:    Structurally normal valve.   Cusp separation was normal.  Doppler:  Transvalvular velocity was within the normal range. There  was no regurgitation.  ------------------------------------------------------------------- Tricuspid valve:   Structurally normal valve.   Leaflet separation was normal.  Doppler:  Transvalvular velocity was within the normal range. There was mild regurgitation.  ------------------------------------------------------------------- Right atrium:  The atrium was normal in size.  ------------------------------------------------------------------- Pericardium:  There was no pericardial effusion.  ------------------------------------------------------------------- Measurements   Left ventricle                            Value          Reference  LV ID, ED, PLAX chordal                   47.9  mm       43 - 52  LV ID, ES, PLAX chordal                   34.2  mm       23 - 38  LV fx shortening, PLAX chordal            29    %        >=29  LV PW thickness, ED                       12.3  mm       ---------  IVS/LV PW ratio, ED                       1.04           <=1.3  Stroke volume, 2D                         71    ml       ---------  Stroke volume/bsa, 2D                     29    ml/m^2   ---------  LV e&', lateral                            8.87  cm/s     ---------  LV E/e&', lateral                          20.07          ---------  LV e&', medial                             6.04  cm/s     ---------  LV E/e&',  medial                           29.47          ---------  LV e&', average                            7.46  cm/s     ---------  LV E/e&', average                          23.88          ---------    Ventricular septum  Value          Reference  IVS thickness, ED                         12.8  mm       ---------    LVOT                                      Value          Reference  LVOT ID, S                                20    mm       ---------  LVOT area                                 3.14  cm^2     ---------  LVOT ID                                   20    mm       ---------  LVOT peak velocity, S                     100   cm/s     ---------  LVOT mean velocity, S                     61.5  cm/s     ---------  LVOT VTI, S                               22.5  cm       ---------  LVOT peak gradient, S                     4     mm Hg    ---------  Stroke volume (SV), LVOT DP               70.7  ml       ---------  Stroke index (SV/bsa), LVOT DP            28.7  ml/m^2   ---------    Aortic valve                              Value          Reference  Aortic valve peak velocity, S             427   cm/s     ---------  Aortic valve mean velocity, S             298   cm/s     ---------  Aortic valve VTI, S                       97.1  cm       ---------  Aortic mean gradient, S                   41  mm Hg    ---------  Aortic peak gradient, S                   73    mm Hg    ---------  VTI ratio, LVOT/AV                        0.23           ---------  Aortic valve area, VTI                    0.73  cm^2     ---------  Aortic valve area/bsa, VTI                0.3   cm^2/m^2 ---------  Velocity ratio, peak, LVOT/AV             0.23           ---------  Aortic valve area, peak velocity          0.74  cm^2     ---------  Aortic valve area/bsa, peak               0.3   cm^2/m^2 ---------  velocity  Velocity ratio, mean, LVOT/AV             0.21           ---------   Aortic valve area, mean velocity          0.65  cm^2     ---------  Aortic valve area/bsa, mean               0.26  cm^2/m^2 ---------  velocity  Aortic regurg pressure half-time          400   ms       ---------    Aorta                                     Value          Reference  Aortic root ID, ED                        33    mm       ---------  Ascending aorta ID, A-P, S                32    mm       ---------    Left atrium                               Value          Reference  LA ID, A-P, ES                            45    mm       ---------  LA ID/bsa, A-P                            1.82  cm/m^2   <=2.2  LA volume, S  77    ml       ---------  LA volume/bsa, S                          31.2  ml/m^2   ---------  LA volume, ES, 1-p A4C                    83    ml       ---------  LA volume/bsa, ES, 1-p A4C                33.6  ml/m^2   ---------  LA volume, ES, 1-p A2C                    71    ml       ---------  LA volume/bsa, ES, 1-p A2C                28.8  ml/m^2   ---------    Mitral valve                              Value          Reference  Mitral E-wave peak velocity               178   cm/s     ---------  Mitral A-wave peak velocity               132   cm/s     ---------  Mitral mean velocity, D                   132   cm/s     ---------  Mitral deceleration time          (H)     296   ms       150 - 230  Mitral pressure half-time                 54    ms       ---------  Mitral mean gradient, D                   8     mm Hg    ---------  Mitral peak gradient, D                   13    mm Hg    ---------  Mitral E/A ratio, peak                    1.3            ---------  Mitral valve area, PHT, DP                4.07  cm^2     ---------  Mitral valve area/bsa, PHT, DP            1.65  cm^2/m^2 ---------  Mitral valve area, LVOT                   1.48  cm^2     ---------  continuity  Mitral valve area/bsa, LVOT               0.6   cm^2/m^2  ---------  continuity  Mitral annulus VTI, D  47.6  cm       ---------    Pulmonary arteries                        Value          Reference  PA pressure, S, DP                (H)     36    mm Hg    <=30    Tricuspid valve                           Value          Reference  Tricuspid regurg peak velocity            287   cm/s     ---------  Tricuspid peak RV-RA gradient             33    mm Hg    ---------    Systemic veins                            Value          Reference  Estimated CVP                             3     mm Hg    ---------    Right ventricle                           Value          Reference  RV pressure, S, DP                (H)     36    mm Hg    <=30  RV s&', lateral, S                         16.2  cm/s     ---------  Legend: (L)  and  (H)  mark values outside specified reference range.  ------------------------------------------------------------------- Prepared and Electronically Authenticated by  Dorris Carnes, M.D. 2018-06-25T16:41:32   Right/Left Heart Cath and Coronary Angiography  Thoracic Aortogram  Conclusion     Acute Mrg lesion, 80 %stenosed.  Ost RCA to Prox RCA lesion, 70 %stenosed.  1st RPLB lesion, 100 %stenosed.  The left ventricular systolic function is normal.  LV end diastolic pressure is normal.  There is severe aortic valve stenosis. There is moderate (3+) aortic regurgitation.  There is moderate (3+) aortic regurgitation.   Mildly elevated right heart pressures with mild pulmonary hypertension.  Severe calcific aortic valve stenosis with reduced excursion and a mean gradient of 44 mmHg; an estimated valve area of 0.98 cm.  Moderate aortic insufficiency.  Mild aortic root dilatation with atherosclerosis and calcification of the proximal aorta.  Normal LV systolic function with an ejection fraction of 55-60% without focal segmental wall motion abnormalities.  Single vessel coronary artery  disease with 70% smooth ostial stenosis in a large RCA, 80% stenosis in the marginal branch, and distal total occlusion of the PLA with left-to-right collateralization to the distal PLA segment.  RECOMMENDATION: Dr. Radford Pax was contacted regarding the results of the catheterization.  The patient will be discharged home later  today with plans for outpatient surgical consultation.   Indications   Severe calcific aortic valve stenosis [I35.0 (ICD-10-CM)]  Procedural Details/Technique   Technical Details Mr. Broedy Osbourne is a 56 year old male who had recently undergone partial right nephrectomy for renal carcinoma, has a remote history of abdominal embolism with bowel infarction and was found to have a lupus anticoagulant for which he has been treated with chronic warfarin therapy. He had not been seen in many years and recently an echo Doppler study has shown severe aortic stenosis with a peak instantaneous gradient of 74 mm and mean gradient of 42 mm with normal LV function. He is now referred for right and left heart cardiac catheterization.  The patient presented to the catheterization laboratory as an outpatient in the fasting state. He was premedicated with Versed 2 mg and fentanyl 50 g. His right femoral artery and right femoral veins were punctured anteriorly and a 5 French arterial sheath and 7 French venous sheath were inserted without difficulty. Right heart catheterization was done and pressure recordings were obtained in the RA, RV, PA, and PCW position. Cardiac outputs were obtained by both the thermodilution method as well as the Fick method. A 6 French pigtail catheter was then inserted. Simultaneous aortic and pulmonary pressures were recorded. Oxygen saturations were obtained. Initial attempts at crossing the aortic valve with the pigtail catheter unsuccessful. Supravalvular aortography was then performed to document the aortic root size, valve excursion, and aortic insufficiency. The  pigtail catheter was removed. Diagnostic coronary angiography was done with 5 French Judkins 4 left and right coronary catheters. The right catheter was then used with straight wire and ultimately the aortic valve was crossed. Simultaneous LV and PC pressures were recorded. Left ventriculography was performed. A careful LV to Ao pullback was done. The patient tolerated the procedure well. Dr. Radford Pax was notified of the results. He left the catheterization laboratory with stable hemodynamics with plans for discharge home later today with outpatient surgical consultation.   Estimated blood loss <50 mL.  During this procedure the patient was administered the following to achieve and maintain moderate conscious sedation: Versed 2 mg, Fentanyl 50 mcg, while the patient's heart rate, blood pressure, and oxygen saturation were continuously monitored. The period of conscious sedation was 58 minutes, of which I was present face-to-face 100% of this time.    Coronary Findings   Dominance: Co-dominant  Left Main  Vessel was injected. Vessel is normal in caliber. Vessel is angiographically normal.  Left Anterior Descending  Vessel was injected. Vessel is normal in caliber. Vessel is angiographically normal.  Ramus Intermedius  Vessel was injected. Vessel is small. Vessel is angiographically normal.  Left Circumflex  Vessel was injected. Vessel is normal in caliber. Vessel is angiographically normal.  Right Coronary Artery  Ost RCA to Prox RCA lesion, 70% stenosed.  Acute Marginal Branch  Acute Mrg lesion, 80% stenosed.  First Right Posterolateral  Vessel is small in size. 1st RPLB filled by collaterals from Dist LAD.  1st RPLB lesion, 100% stenosed.  Vascular Findings   Abdominal Aorta Aortic Root: Supravalvular aortography revealed mild aortic root dilatation with atherosclerosis of the ascending aorta.    Right Heart   Right Heart Pressures RA: 12 RV: 40/10 PA: 39/21 PW: A wave 18, V wave 25,  mean 15.  AO: 145/80 PA: 34/12  LV: 189/29 PW 20/23 Mean 19  Pullback: LV 178/30 AO: 133/80  Oxygen saturation in the aorta. 97% and in the PA 66%.  Cardiac output  by the thermodilution method 7.1, and by the Fick method 5.7 liters per minute. Cardiac index by the thermodilution method 3.1 by the Fick method 2.5 L/m/m.  Aortic valve peak to peak (not peak instantaneous) gradient: 45 mm Hg Aortic valve mean gradient 44 mm Hg.  Aortic valve area 0.98 cm    Wall Motion              Left Heart   Left Ventricle The left ventricular size is normal. The left ventricular systolic function is normal. LV end diastolic pressure is normal. No regional wall motion abnormalities.    Aortic Valve There is severe aortic valve stenosis. There is moderate (3+) aortic regurgitation. The aortic valve is calcified. There is restricted aortic valve motion.    Aorta Aortic Root: The aortic root displays dilation. There is moderate (3+) aortic regurgitation.    Coronary Diagrams   Diagnostic Diagram       Implants     No implant documentation for this case.  PACS Images   Show images for Cardiac catheterization   Link to Procedure Log   Procedure Log    Hemo Data    Most Recent Value  Fick Cardiac Output 5.74 L/min  Fick Cardiac Output Index 2.48 (L/min)/BSA  Thermal Cardiac Output 7.12 L/min  Thermal Cardiac Output Index 3.08 (L/min)/BSA  Aortic Mean Gradient 43.5 mmHg  Aortic Peak Gradient 45 mmHg  Aortic Valve Area 0.98  Aortic Value Area Index 0.42 cm2/BSA  RA A Wave 12 mmHg  RA V Wave 12 mmHg  RA Mean 10 mmHg  RV Systolic Pressure 40 mmHg  RV Diastolic Pressure 2 mmHg  RV EDP 10 mmHg  PA Systolic Pressure 34 mmHg  PA Diastolic Pressure 12 mmHg  PA Mean 27 mmHg  PW A Wave 24 mmHg  PW V Wave 27 mmHg  PW Mean 23 mmHg  AO Systolic Pressure 027 mmHg  AO Diastolic Pressure 66 mmHg  AO Mean 87 mmHg  LV Systolic Pressure 741 mmHg  LV Diastolic Pressure 9 mmHg    LV EDP 32 mmHg  Arterial Occlusion Pressure Extended Systolic Pressure 287 mmHg  Arterial Occlusion Pressure Extended Diastolic Pressure 80 mmHg  Arterial Occlusion Pressure Extended Mean Pressure 102 mmHg  Left Ventricular Apex Extended Systolic Pressure 867 mmHg  Left Ventricular Apex Extended Diastolic Pressure 11 mmHg  Left Ventricular Apex Extended EDP Pressure 30 mmHg  TPVR Index 10.06 HRUI  TSVR Index 35.04 HRUI  PVR SVR Ratio 0.16  TPVR/TSVR Ratio 0.29     STS Risk Calculator  Procedure    AVR + CABG  Risk of Mortality   1.3% Morbidity or Mortality  12.5% Prolonged LOS   5.0% Short LOS    49.3% Permanent Stroke   0.8% Prolonged Vent Support  7.4% DSW Infection    0.5% Renal Failure    2.6% Reoperation    5.5%   Impression:  Patient has stage D severe symptomatic aortic stenosis and single-vessel coronary artery disease. He presents with a gradual regression of symptoms of exertional shortness of breath, chest tightness, and fatigue consistent with chronic diastolic congestive heart failure and angina pectoris, New York Heart Association functional class III. I have personally reviewed the patient's recent transthoracic echocardiogram and diagnostic cardiac catheterization. Echocardiogram confirms the presence of severe aortic stenosis. The aortic valve is trileaflet with severe thickening, restricted leaflet mobility, and moderate calcification involving all 3 leaflets. Peak velocity across the aortic valve measured 4.3 m/s corresponding to mean transvalvular gradient estimated  41 mmHg. Left ventricular systolic function appears normal. Diagnostic cardiac catheterization confirmed the presence of severe aortic stenosis with mean transvalvular gradient measured 44 mmHg at catheterization.  Catheterization also reveals significant single-vessel coronary artery disease with tubular 70% ostial stenosis of the right coronary artery. There is no significant flow limiting disease in  the left coronary system.  Pulmonary artery pressures were mildly elevated. I agree the patient would best be treated with aortic valve replacement and coronary artery bypass grafting.  Risks associated with conventional surgery should be reasonably low although they may be somewhat higher than that predicted using the STS risk calculator because of the patient's history of mantle radiation therapy to the chest in the past for Hodgkin's lymphoma. In addition, the patient is chronically anticoagulated using warfarin for presumed hypercoagulable state secondary to antiphospholipid antibody syndrome.   Plan:  The patient and his wife were counseled at length regarding treatment alternatives for management of severe aortic stenosis and coronary artery disease including continued medical therapy versus proceeding with aortic valve replacement and coronary artery bypass grafting in the near future.  The natural history of aortic stenosis was reviewed, as was long term prognosis with medical therapy alone.  Surgical options were discussed at length including conventional surgical aortic valve replacement with or without CABG and transcatheter aortic valve replacement with or without PCI.  Discussion was held comparing the relative risks of mechanical valve replacement with need for lifelong anticoagulation versus use of a bioprosthetic tissue valve and the associated potential for late structural valve deterioration and failure.  This discussion was placed in the context of the patient's particular circumstances, and as a result the patient specifically requests that their valve be replaced using a mechanical valve since he remains chronically anticoagulated using warfarin.  The patient hopes to proceed with surgery in the near future although he insists on waiting until after 11/23/2016 because his son is getting married on that date. We tentatively plan to proceed with surgery on 11/28/2016. The patient has been  advised to avoid any type of strenuous physical exertion between now and the time of surgery. I cautioned him that traveling long distances could be problematic, and I encouraged him to contact Dr. Theodosia Blender office as soon as possible if he develops any significant worsening of symptoms of shortness breath or chest discomfort. The patient will return to our office on 11/25/2016 for follow-up prior to surgery.  Between now and then he will undergo cardiac gated CT angiogram of the heart to evaluate the patient's aortic stenosis, the size and anatomical characteristics of the aortic root, and the proximal ascending thoracic aorta.  I spent in excess of 90 minutes during the conduct of this office consultation and >50% of this time involved direct face-to-face encounter with the patient for counseling and/or coordination of their care.   Valentina Gu. Roxy Manns, MD 10/30/2016 4:05 PM

## 2016-10-31 ENCOUNTER — Other Ambulatory Visit: Payer: Self-pay

## 2016-10-31 DIAGNOSIS — I2510 Atherosclerotic heart disease of native coronary artery without angina pectoris: Secondary | ICD-10-CM

## 2016-10-31 DIAGNOSIS — I350 Nonrheumatic aortic (valve) stenosis: Secondary | ICD-10-CM

## 2016-11-12 ENCOUNTER — Ambulatory Visit (INDEPENDENT_AMBULATORY_CARE_PROVIDER_SITE_OTHER): Payer: Managed Care, Other (non HMO) | Admitting: Pharmacist

## 2016-11-12 DIAGNOSIS — I824Y9 Acute embolism and thrombosis of unspecified deep veins of unspecified proximal lower extremity: Secondary | ICD-10-CM

## 2016-11-12 LAB — POCT INR: INR: 2.6

## 2016-11-18 ENCOUNTER — Encounter (HOSPITAL_COMMUNITY): Payer: Self-pay

## 2016-11-18 ENCOUNTER — Ambulatory Visit (HOSPITAL_COMMUNITY)
Admission: RE | Admit: 2016-11-18 | Discharge: 2016-11-18 | Disposition: A | Discharge status: Home / Self Care | Payer: Managed Care, Other (non HMO) | Source: Ambulatory Visit | Attending: Thoracic Surgery (Cardiothoracic Vascular Surgery) | Admitting: Thoracic Surgery (Cardiothoracic Vascular Surgery)

## 2016-11-18 DIAGNOSIS — K760 Fatty (change of) liver, not elsewhere classified: Secondary | ICD-10-CM | POA: Diagnosis not present

## 2016-11-18 DIAGNOSIS — I289 Disease of pulmonary vessels, unspecified: Secondary | ICD-10-CM | POA: Insufficient documentation

## 2016-11-18 DIAGNOSIS — I350 Nonrheumatic aortic (valve) stenosis: Secondary | ICD-10-CM | POA: Insufficient documentation

## 2016-11-18 MED ORDER — METOPROLOL TARTRATE 5 MG/5ML IV SOLN
INTRAVENOUS | Status: AC
  Administered 2016-11-18: 5 mg via INTRAVENOUS
  Filled 2016-11-18: qty 10

## 2016-11-18 MED ORDER — METOPROLOL TARTRATE 5 MG/5ML IV SOLN
5.0000 mg | INTRAVENOUS | Status: DC | PRN
  Administered 2016-11-18 (×2): 5 mg via INTRAVENOUS
  Filled 2016-11-18: qty 5

## 2016-11-18 MED ORDER — IOPAMIDOL (ISOVUE-370) INJECTION 76%
INTRAVENOUS | Status: AC
  Filled 2016-11-18: qty 100

## 2016-11-18 NOTE — Progress Notes (Signed)
Tolerated ct with out incident ambulatory steady gait. Tolerated po challenge.

## 2016-11-22 NOTE — Pre-Procedure Instructions (Signed)
Michael Delgado  11/22/2016      [facility] - [place], [place] - [address] [place] [address] Phone: [phone] Fax: [phone]    Your procedure is scheduled on Thursday, August 16.  Report to [facility] Admitting at 5:30 AM                 Your surgery or procedure is scheduled for 7:30 AM   Call this number if you have problems the morning of surgery:[phone] - pre- op desk   Remember:  Do not eat food or drink liquids after midnight Wednesday, August 15.  Take these medicines the morning of surgery with A SIP OF WATER: levothyroxine (SYNTHROID, LEVOTHROID),  metoprolol tartrate (Lopressor).  May use inhaler.                   MAy take benzonatate (TESSALON) if needed.  STOP taking Aspirin , Aspirin Products (Goody Powder, Excedrin Migraine), Ibuprofen (Advil), Naproxen (Aleve), Vitamins and Herbal Products (ie Fish Oil).  Special instructions:  [facility]- Preparing For Surgery  Before surgery, you can play an important role. Because skin is not sterile, your skin needs to be as free of germs as possible. You can reduce the number of germs on your skin by washing with CHG (chlorahexidine gluconate) Soap before surgery.  CHG is an antiseptic cleaner which kills germs and bonds with the skin to continue killing germs even after washing.  Please do not use if you have an allergy to CHG or antibacterial soaps. If your skin becomes reddened/irritated stop using the CHG.  Do not shave (including legs and underarms) for at least 48 hours prior to first CHG shower. It is OK to shave your face.  Please follow these instructions carefully.   1. Shower the NIGHT BEFORE SURGERY and the MORNING OF SURGERY with CHG.   2. If you chose to wash your hair, wash your hair first as usual with your normal shampoo.  3. After you shampoo, rinse your hair and body thoroughly to remove the shampoo.  Wash your face and private area with the  soap you use at home, then rinse. 4. Use CHG as you would any other liquid soap. You can apply CHG directly to the skin 5.  and wash gently with a scrungie or a clean washcloth.   6. Apply the CHG Soap to your body ONLY FROM THE NECK DOWN.  Do not use on open wounds or open sores. Avoid contact with your eyes, ears, mouth and genitals (private parts). Wash genitals (private parts) with your normal soap.  7. Wash thoroughly, paying special attention to the area where your surgery will be performed.  8. Thoroughly rinse your body with warm water from the neck down.  9. DO NOT shower/wash with your normal soap after using and rinsing off the CHG Soap.  10. Pat yourself dry with a CLEAN TOWEL.   11. Wear CLEAN PAJAMAS   12. Place CLEAN SHEETS on your bed the night of your first shower and DO NOT SLEEP WITH PETS.  Day of Surgery: Shower as above. Do not apply any deodorants/lotions, powders or cologne. Please wear clean clothes to the hospital/surgery center.    Do not wear jewelry, make-up or nail polish.  Do not shave 48 hours prior to surgery.  Men may shave face and neck.  Do not bring valuables to the hospital.  [facility] is not responsible for  any belongings or valuables.  Contacts, dentures or bridgework may not be worn into surgery.  Leave your suitcase in the car.  After surgery it may be brought to your room.  For patients admitted to the hospital, discharge time will be determined by your treatment team.  Please read over the following fact sheets that you were given: Pain Booklet, Patient Instructions for Mupirocin Application, Incentive Spirometry, Surgical Site Infections.

## 2016-11-25 ENCOUNTER — Encounter (HOSPITAL_COMMUNITY)
Admission: RE | Admit: 2016-11-25 | Discharge: 2016-11-25 | Disposition: A | Discharge status: Home / Self Care | Payer: Managed Care, Other (non HMO) | Source: Ambulatory Visit | Attending: Thoracic Surgery (Cardiothoracic Vascular Surgery) | Admitting: Thoracic Surgery (Cardiothoracic Vascular Surgery)

## 2016-11-25 ENCOUNTER — Ambulatory Visit (HOSPITAL_COMMUNITY)
Admission: RE | Admit: 2016-11-25 | Discharge: 2016-11-25 | Disposition: A | Discharge status: Home / Self Care | Payer: Managed Care, Other (non HMO) | Source: Ambulatory Visit | Attending: Thoracic Surgery (Cardiothoracic Vascular Surgery) | Admitting: Thoracic Surgery (Cardiothoracic Vascular Surgery)

## 2016-11-25 ENCOUNTER — Encounter: Payer: Self-pay | Admitting: Thoracic Surgery (Cardiothoracic Vascular Surgery)

## 2016-11-25 ENCOUNTER — Encounter (HOSPITAL_COMMUNITY): Payer: Self-pay

## 2016-11-25 ENCOUNTER — Ambulatory Visit (INDEPENDENT_AMBULATORY_CARE_PROVIDER_SITE_OTHER): Payer: Managed Care, Other (non HMO) | Admitting: Thoracic Surgery (Cardiothoracic Vascular Surgery)

## 2016-11-25 VITALS — BP 104/58 | HR 87 | Resp 20 | Ht 69.0 in | Wt 267.0 lb

## 2016-11-25 DIAGNOSIS — Z7901 Long term (current) use of anticoagulants: Secondary | ICD-10-CM

## 2016-11-25 DIAGNOSIS — I2510 Atherosclerotic heart disease of native coronary artery without angina pectoris: Secondary | ICD-10-CM | POA: Insufficient documentation

## 2016-11-25 DIAGNOSIS — I350 Nonrheumatic aortic (valve) stenosis: Secondary | ICD-10-CM

## 2016-11-25 DIAGNOSIS — Z01818 Encounter for other preprocedural examination: Secondary | ICD-10-CM | POA: Insufficient documentation

## 2016-11-25 DIAGNOSIS — I6523 Occlusion and stenosis of bilateral carotid arteries: Secondary | ICD-10-CM | POA: Insufficient documentation

## 2016-11-25 DIAGNOSIS — Z01812 Encounter for preprocedural laboratory examination: Secondary | ICD-10-CM | POA: Insufficient documentation

## 2016-11-25 DIAGNOSIS — Z0183 Encounter for blood typing: Secondary | ICD-10-CM | POA: Diagnosis not present

## 2016-11-25 DIAGNOSIS — I440 Atrioventricular block, first degree: Secondary | ICD-10-CM | POA: Diagnosis not present

## 2016-11-25 HISTORY — DX: Pneumonia, unspecified organism: J18.9

## 2016-11-25 HISTORY — DX: Disease of blood and blood-forming organs, unspecified: D75.9

## 2016-11-25 HISTORY — DX: Chronic obstructive pulmonary disease, unspecified: J44.9

## 2016-11-25 HISTORY — DX: Malignant (primary) neoplasm, unspecified: C80.1

## 2016-11-25 LAB — TYPE AND SCREEN
ABO/RH(D): A POS
Antibody Screen: NEGATIVE

## 2016-11-25 LAB — COMPREHENSIVE METABOLIC PANEL
ALK PHOS: 54 U/L (ref 38–126)
ALT: 36 U/L (ref 17–63)
AST: 26 U/L (ref 15–41)
Albumin: 3.8 g/dL (ref 3.5–5.0)
Anion gap: 8 (ref 5–15)
BUN: 13 mg/dL (ref 6–20)
CALCIUM: 8.6 mg/dL — AB (ref 8.9–10.3)
CO2: 24 mmol/L (ref 22–32)
CREATININE: 0.86 mg/dL (ref 0.61–1.24)
Chloride: 104 mmol/L (ref 101–111)
GFR calc non Af Amer: >60 mL/min (ref >60)
GLUCOSE: 117 mg/dL — AB (ref 65–99)
Potassium: 4.4 mmol/L (ref 3.5–5.1)
SODIUM: 136 mmol/L (ref 135–145)
Total Bilirubin: 0.8 mg/dL (ref 0.3–1.2)
Total Protein: 6.9 g/dL (ref 6.5–8.1)

## 2016-11-25 LAB — PULMONARY FUNCTION TEST
DL/VA % pred: 95 %
DL/VA: 4.37 ml/min/mmHg/L
DLCO COR % PRED: 68 %
DLCO UNC: 20.39 ml/min/mmHg
DLCO cor: 21.07 ml/min/mmHg
DLCO unc % pred: 65 %
FEF 25-75 POST: 2.38 L/s
FEF 25-75 Pre: 2.15 L/sec
FEF2575-%Change-Post: 10 %
FEF2575-%PRED-POST: 76 %
FEF2575-%PRED-PRE: 69 %
FEV1-%CHANGE-POST: 1 %
FEV1-%PRED-POST: 64 %
FEV1-%PRED-PRE: 63 %
FEV1-POST: 2.36 L
FEV1-Pre: 2.31 L
FEV1FVC-%Change-Post: 0 %
FEV1FVC-%PRED-PRE: 103 %
FEV6-%Change-Post: 2 %
FEV6-%Pred-Post: 64 %
FEV6-%Pred-Pre: 63 %
FEV6-Post: 2.94 L
FEV6-Pre: 2.88 L
FEV6FVC-%CHANGE-POST: 0 %
FEV6FVC-%Pred-Post: 104 %
FEV6FVC-%Pred-Pre: 103 %
FVC-%CHANGE-POST: 1 %
FVC-%PRED-POST: 62 %
FVC-%PRED-PRE: 61 %
FVC-POST: 2.94 L
FVC-PRE: 2.9 L
POST FEV1/FVC RATIO: 80 %
PRE FEV6/FVC RATIO: 99 %
Post FEV6/FVC ratio: 100 %
Pre FEV1/FVC ratio: 80 %
RV % pred: 87 %
RV: 1.84 L
TLC % PRED: 76 %
TLC: 5.17 L

## 2016-11-25 LAB — URINALYSIS, ROUTINE W REFLEX MICROSCOPIC
Bilirubin Urine: NEGATIVE
Glucose, UA: NEGATIVE mg/dL
Hgb urine dipstick: NEGATIVE
Ketones, ur: NEGATIVE mg/dL
LEUKOCYTES UA: NEGATIVE
Nitrite: NEGATIVE
PROTEIN: NEGATIVE mg/dL
Specific Gravity, Urine: 1.018 (ref 1.005–1.030)
pH: 5 (ref 5.0–8.0)

## 2016-11-25 LAB — VAS US DOPPLER PRE CABG
LEFT ECA DIAS: -15 cm/s
LEFT VERTEBRAL DIAS: 17 cm/s
LICADSYS: -86 cm/s
LICAPDIAS: 46 cm/s
Left CCA prox dias: 17 cm/s
Left CCA prox sys: 71 cm/s
Left ICA dist dias: -35 cm/s
Left ICA prox sys: 162 cm/s
RCCAPSYS: -63 cm/s
RIGHT ECA DIAS: -21 cm/s
RIGHT VERTEBRAL DIAS: -11 cm/s
Right CCA prox dias: -18 cm/s
Right cca dist sys: -79 cm/s

## 2016-11-25 LAB — BLOOD GAS, ARTERIAL
ACID-BASE EXCESS: 1.4 mmol/L (ref 0.0–2.0)
BICARBONATE: 25.7 mmol/L (ref 20.0–28.0)
Drawn by: 449841
O2 SAT: 96 %
PATIENT TEMPERATURE: 98.6
PO2 ART: 81.2 mmHg — AB (ref 83.0–108.0)
pCO2 arterial: 42.6 mmHg (ref 32.0–48.0)
pH, Arterial: 7.398 (ref 7.350–7.450)

## 2016-11-25 LAB — CBC
HEMATOCRIT: 40.7 % (ref 39.0–52.0)
HEMOGLOBIN: 13.5 g/dL (ref 13.0–17.0)
MCH: 27.2 pg (ref 26.0–34.0)
MCHC: 33.2 g/dL (ref 30.0–36.0)
MCV: 82.1 fL (ref 78.0–100.0)
Platelets: 242 10*3/uL (ref 150–400)
RBC: 4.96 MIL/uL (ref 4.22–5.81)
RDW: 13.7 % (ref 11.5–15.5)
WBC: 8.8 10*3/uL (ref 4.0–10.5)

## 2016-11-25 LAB — PROTIME-INR
INR: 1.61
Prothrombin Time: 19.3 seconds — ABNORMAL HIGH (ref 11.4–15.2)

## 2016-11-25 LAB — SURGICAL PCR SCREEN
MRSA, PCR: NEGATIVE
Staphylococcus aureus: POSITIVE — AB

## 2016-11-25 LAB — APTT: aPTT: 32 seconds (ref 24–36)

## 2016-11-25 LAB — ABO/RH: ABO/RH(D): A POS

## 2016-11-25 MED ORDER — ALBUTEROL SULFATE (2.5 MG/3ML) 0.083% IN NEBU
2.5000 mg | INHALATION_SOLUTION | Freq: Once | RESPIRATORY_TRACT | Status: AC
  Administered 2016-11-25: 2.5 mg via RESPIRATORY_TRACT

## 2016-11-25 NOTE — Pre-Procedure Instructions (Addendum)
Michael Delgado  11/25/2016      [facility] - [place], [place] - [address] [place] [address] Phone: [phone] Fax: [phone]    Your procedure is scheduled on Thursday, August 16.  Report to [facility] Admitting at 5:30 AM                 Your surgery or procedure is scheduled for 7:30 AM   Call this number if you have problems the morning of surgery:[phone] - pre- op desk   Remember:  Do not eat food or drink liquids after midnight Wednesday, August 15.  Take these medicines the morning of surgery with A SIP OF WATER: levothyroxine (SYNTHROID, LEVOTHROID),  metoprolol tartrate (Lopressor).  May use inhaler.                   MAy take benzonatate (TESSALON) if needed.  STOP taking Aspirin Products (Goody Powder, Excedrin Migraine), Ibuprofen (Advil), Naproxen (Aleve), Vitamins and Herbal Products (ie Fish Oil).  Stop Aspirin, coumadin per dr  Special instructions:  [facility]- Preparing For Surgery  Before surgery, you can play an important role. Because skin is not sterile, your skin needs to be as free of germs as possible. You can reduce the number of germs on your skin by washing with CHG (chlorahexidine gluconate) Soap before surgery.  CHG is an antiseptic cleaner which kills germs and bonds with the skin to continue killing germs even after washing.  Please do not use if you have an allergy to CHG or antibacterial soaps. If your skin becomes reddened/irritated stop using the CHG.  Do not shave (including legs and underarms) for at least 48 hours prior to first CHG shower. It is OK to shave your face.  Please follow these instructions carefully.   1. Shower the NIGHT BEFORE SURGERY and the MORNING OF SURGERY with CHG.   2. If you chose to wash your hair, wash your hair first as usual with your normal shampoo.  3. After you shampoo, rinse your hair and body thoroughly to remove the shampoo.  Wash your face and  private area with the soap you use at home, then rinse. 4. Use CHG as you would any other liquid soap. You can apply CHG directly to the skin 5.  and wash gently with a scrungie or a clean washcloth.   6. Apply the CHG Soap to your body ONLY FROM THE NECK DOWN.  Do not use on open wounds or open sores. Avoid contact with your eyes, ears, mouth and genitals (private parts). Wash genitals (private parts) with your normal soap.  7. Wash thoroughly, paying special attention to the area where your surgery will be performed.  8. Thoroughly rinse your body with warm water from the neck down.  9. DO NOT shower/wash with your normal soap after using and rinsing off the CHG Soap.  10. Pat yourself dry with a CLEAN TOWEL.   11. Wear CLEAN PAJAMAS   12. Place CLEAN SHEETS on your bed the night of your first shower and DO NOT SLEEP WITH PETS.  Day of Surgery: Shower as above. Do not apply any deodorants/lotions, powders or cologne. Please wear clean clothes to the hospital/surgery center.    Do not wear jewelry, make-up or nail polish.  Do not shave 48 hours prior to surgery.  Men may shave face and neck.  Do not bring valuables to the hospital.  [facility]  is not responsible for any belongings or valuables.  Contacts, dentures or bridgework may not be worn into surgery.  Leave your suitcase in the car.  After surgery it may be brought to your room.  For patients admitted to the hospital, discharge time will be determined by your treatment team.  Please read over the  fact sheets that you were given:

## 2016-11-25 NOTE — Progress Notes (Signed)
Pre-op Cardiac Surgery  Carotid Findings:  Right 1-39% ICA stenosis, Left 40-59% ICA stenosis, antegrade vertebral flow.   Upper Extremity Right Left  Brachial Pressures 145, Tri 164, Tri  Radial Waveforms Tri Tri  Ulnar Waveforms Tri Tri  Palmar Arch (Allen's Test) waveform is unchanged with radial compression and reverses with ulnar comrpession. waveform increases greater than 50% with radial compression and obliterates with ulnar compression.   Lower  Extremity Right Left  Dorsalis Pedis 191, Tri 179, Tri  Posterior Tibial 197, Tri 183, Tri  Ankle/Brachial Indices 1.2 1.1   Lita Mains- RDMS, RVT 12:02 PM  11/25/2016

## 2016-11-25 NOTE — Progress Notes (Signed)
[address]       [place] [address]             [phone]     CARDIOTHORACIC SURGERY OFFICE NOTE  Referring Provider is Sueanne Margarita, MD PCP is [name], MD   HPI:  Patient returns to the office today for follow-up of severe symptomatic aortic stenosis and coronary artery disease stented plans to proceed with aortic valve replacement and coronary artery bypass grafting later this week. He was originally seen in consultation on 10/30/2016. Since then he traveled to [place] where his son got married. He returns to our office today and reports no new problems or complaints. He continues to experience exertional shortness of breath without chest discomfort. He states that symptoms have not progressed over the last few weeks. The remainder of his review of systems remains unchanged from previously.   Current Outpatient Prescriptions  Medication Sig Dispense Refill  . benzonatate (TESSALON) 100 MG capsule Take 100 mg by mouth daily as needed for cough.    . budesonide-formoterol (SYMBICORT) 80-4.5 MCG/ACT inhaler Inhale 1 puff into the lungs at bedtime as needed (shortness of breath).    . diphenhydramine-acetaminophen (TYLENOL PM) 25-500 MG TABS tablet Take 1 tablet by mouth at bedtime.    Marland Kitchen levothyroxine (SYNTHROID, LEVOTHROID) 50 MCG tablet Take 50 mcg by mouth daily before breakfast.     . loratadine (CLARITIN) 10 MG tablet Take 10 mg by mouth daily as needed for allergies.    . metoprolol tartrate (LOPRESSOR) 25 MG tablet Take 0.5 tablets (12.5 mg total) by mouth 2 (two) times daily. 60 tablet 2  . Potassium 99 MG TABS Take 99 mg by mouth every evening.     . quinapril (ACCUPRIL) 20 MG tablet Take 20 mg by mouth 2 (two) times daily.    . simvastatin (ZOCOR) 40 MG tablet Take 40 mg by mouth every evening.    . Omega-3 Fatty Acids (FISH OIL PO) Take 3 capsules by mouth at bedtime. 1200/360 MG    . warfarin (COUMADIN) 5 MG tablet Take 5 mg by mouth every  evening.      No current facility-administered medications for this visit.       Physical Exam:   BP (!) 104/58   Pulse 87   Resp 20   Ht 5\' 9"  (1.753 m)   Wt 267 lb (121.1 kg)   SpO2 97% Comment: RA  BMI 39.43 kg/m   General:  Well-appearing  Chest:   Clear to auscultation with symmetrical breath sounds  CV:   Regular rate and rhythm with prominent systolic murmur heard best along the right sternal border  Incisions:  n/a  Abdomen:  Soft nontender  Extremities:  Warm and well-perfused  Diagnostic Tests:  Cardiac TAVR CT  TECHNIQUE: The patient was scanned on a [employer]. A 120 kV retrospective scan was triggered in the descending thoracic aorta at 111 HU's. Gantry rotation speed was 250 msecs and collimation was .6 mm. No beta blockade or nitro were given. The 3D data set was reconstructed in 5% intervals of the R-R cycle. Systolic and diastolic phases were analyzed on a dedicated work station using MPR, MIP and VRT modes. The patient received 80 cc of contrast.  FINDINGS: Aortic Valve: Trileaflet, severely thickened and calcified with severely restricted leaflet opening. Moderate calcifications extending into the LVOT.  Aorta:  Normal size, mild calcifications, no dissection.  Sinotubular Junction:  30 x 28 mm  Ascending Thoracic Aorta:  35 x 34 mm  Aortic Arch:  30 x 26 mm  Descending Thoracic Aorta:  25 x 25 mm  Sinus of Valsalva Measurements:  Non-coronary:  33 mm  Right -coronary:  33 mm  Left -coronary:  34 mm  Coronary Artery Height above Annulus:  Left Main:  14 mm  Right Coronary:  13 mm  Virtual Basal Annulus Measurements:  Maximum/Minimum Diameter:  28 x 21 mm  Perimeter:  78 mm  Area:  460 mm2  IMPRESSION: 1. Trileaflet, severely thickened and calcified aortic valve with severely restricted leaflet opening. Moderate calcifications extending into the LVOT.  2. Normal size of the thoracic aorta  with mild calcifications and no dissection.  3. Moderate circumferential mitral annular calcifications.  4. No thrombus in the left atrial appendage.  5. No ASD/VSD.  6. Mildly dilated pulmonary artery measuring 30 mm.  Michael Delgado   Electronically Signed   By: Michael Delgado   On: 11/19/2016 19:19   Impression:  Patient has stage D severe symptomatic aortic stenosis and single-vessel coronary artery disease. He presents with a gradual regression of symptoms of exertional shortness of breath, chest tightness, and fatigue consistent with chronic diastolic congestive heart failure and angina pectoris, New York Heart Association functional class III. I have personally reviewed the patient's recent transthoracic echocardiogram and diagnostic cardiac catheterization. Echocardiogram confirms the presence of severe aortic stenosis. The aortic valve is trileaflet with severe thickening, restricted leaflet mobility, and moderate calcification involving all 3 leaflets. Peak velocity across the aortic valve measured 4.3 m/s corresponding to mean transvalvular gradient estimated 41 mmHg. Left ventricular systolic function appears normal. Diagnostic cardiac catheterization confirmed the presence of severe aortic stenosis with mean transvalvular gradient measured 44 mmHg at catheterization.  Catheterization also reveals significant single-vessel coronary artery disease with tubular 70% ostial stenosis of the right coronary artery. There is no significant flow limiting disease in the left coronary system.  Pulmonary artery pressures were mildly elevated. I agree the patient would best be treated with aortic valve replacement and coronary artery bypass grafting.  Risks associated with conventional surgery should be reasonably low although they may be somewhat higher than that predicted using the STS risk calculator because of the patient's history of mantle radiation therapy to the chest in the  past for Hodgkin's lymphoma. In addition, the patient is chronically anticoagulated using warfarin for presumed hypercoagulable state secondary to antiphospholipid antibody syndrome.  Plan:  The patient and his wife were again counseled at length regarding treatment alternatives for management of severe aortic stenosis and coronary artery disease including continued medical therapy versus proceeding with aortic valve replacement and coronary artery bypass grafting in the near future.  The natural history of aortic stenosis was reviewed, as was long term prognosis with medical therapy alone.   Expectations for his postoperative recovery have been discussed. Discussion was held comparing the relative risks of mechanical valve replacement with need for lifelong anticoagulation versus use of a bioprosthetic tissue valve and the associated potential for late structural valve deterioration and failure.  This discussion was placed in the context of the patient's particular circumstances, and as a result the patient specifically requests that their valve be replaced using a mechanical valve since he remains chronically anticoagulated using warfarin.   They understand and accept all potential risks of surgery including but not limited to risk of death, stroke or other neurologic complication, myocardial infarction, congestive heart failure, respiratory failure, renal failure, bleeding requiring transfusion and/or  reexploration, arrhythmia, infection or other wound complications, pneumonia, pleural and/or pericardial effusion, pulmonary embolus, aortic dissection or other major vascular complication, or delayed complications related to valve repair or replacement including but not limited to structural valve deterioration and failure, thrombosis, embolization, endocarditis, or paravalvular leak.  All of their questions have been answered.  We plan to proceed with surgery on Thursday, 11/28/2016 as previously  planned.   I spent in excess of 15 minutes during the conduct of this office consultation and >50% of this time involved direct face-to-face encounter with the patient for counseling and/or coordination of their care.    Michael Gu. Roxy Manns, MD 11/25/2016 2:06 PM

## 2016-11-26 LAB — HEMOGLOBIN A1C
HEMOGLOBIN A1C: 6.3 % — AB (ref 4.8–5.6)
MEAN PLASMA GLUCOSE: 134 mg/dL

## 2016-11-26 NOTE — Progress Notes (Addendum)
Anesthesia Chart Review: Patient is a 56 year old male scheduled for AV replacement, CABG on 11/28/16 by Dr. Roxy Manns.  History includes former smoker, severe AS, CAD, CHF, COPD, HTN, HLD, positive lupus anticoagulant, hypothyroidism, GERD, Hodgkin's Lymphoma s/p chemoradiation, stage I renal cell carcinoma s/p right partial nephrectomy 09/11/16. BMI is consistent with obesity.   PCP is Dr. [name] Cardiologist is Dr. Fransico Him.   Meds include Symbicort, levothyroxine, Lopressor, fish oil, potassium, quinapril, Zocor, warfarin (last dose 11/23/16).  BP (!) 147/77   Pulse 80   Temp 36.6 C   Resp 18   Ht 5' 9.5" (1.765 m)   Wt 267 lb 1.6 oz (121.2 kg)   SpO2 96%   BMI 38.88 kg/m   Cardiac cath 10/23/16:  Acute Mrg lesion, 80 %stenosed.  Ost RCA to Prox RCA lesion, 70 %stenosed.  1st RPLB lesion, 100 %stenosed.  The left ventricular systolic function is normal.  LV end diastolic pressure is normal.  There is severe aortic valve stenosis. There is moderate (3+) aortic regurgitation.  There is moderate (3+) aortic regurgitation.  - Mildly elevated right heart pressures with mild pulmonary hypertension. - Severe calcific aortic valve stenosis with reduced excursion and a mean gradient of 44 mmHg; an estimated valve area of 0.98 cm.  Moderate aortic insufficiency. - Mild aortic root dilatation with atherosclerosis and calcification of the proximal aorta. - Normal LV systolic function with an ejection fraction of 55-60% without focal segmental wall motion abnormalities. - Single vessel coronary artery disease with 70% smooth ostial stenosis in a large RCA, 80% stenosis in the marginal branch, and distal total occlusion of the PLA with left-to-right collateralization to the distal PLA segment.  Echo 10/07/16: Study Conclusions - Left ventricle: The cavity size was normal. Wall thickness was   increased in a pattern of mild LVH. Systolic function was normal.   The estimated  ejection fraction was in the range of 60% to 65%. - Aortic valve: AV is thickened, calcified with restricted motion.   Peak and mean gradients through the valve are 73 and 42 mm Hg   respectively consistent with severe AS. There was moderate   regurgitation. - Left atrium: The atrium was mildly dilated. - Pulmonary arteries: PA peak pressure: 36 mm Hg (S).  Carotid U/S 11/25/16: Summary: - The vertebral arteries appear patent with antegrade flow. - Findings consistent with 73 - 48 percent stenosis involving the   left internal carotid artery. - Findings consistent with a 1-39 percent stenosis involving the   right internal carotid artery.  Preoperative EKG, cardiac CT, CXR noted.   PFTs 11/25/16: FVC 2.90 (61%), FEV1 2.31 (63%), DLCO unc 20.39 (65%).  Preoperative labs noted. CBC WNL. Cr 0.86. PT 19.3, INR 1.61. PTT 32. A1c 6.3.   If no acute changes then I anticipate that he can proceed as planned.  George Hugh [facility] Stay [facility]/Anesthesiology Phone [phone] 11/26/2016 10:45 AM

## 2016-11-28 ENCOUNTER — Encounter (HOSPITAL_COMMUNITY): Payer: Self-pay | Admitting: Certified Registered Nurse Anesthetist

## 2016-11-28 MED ORDER — SODIUM CHLORIDE 0.9 % IV SOLN
30.0000 ug/min | INTRAVENOUS | Status: AC
  Administered 2016-11-29: 15 ug/min via INTRAVENOUS
  Filled 2016-11-28: qty 2

## 2016-11-28 MED ORDER — KENNESTONE BLOOD CARDIOPLEGIA (KBC) MANNITOL SYRINGE (20%, 32ML)
32.0000 mL | Freq: Once | INTRAVENOUS | Status: DC
  Filled 2016-11-28: qty 1

## 2016-11-28 MED ORDER — VANCOMYCIN HCL 1000 MG IV SOLR
INTRAVENOUS | Status: AC
  Administered 2016-11-29: 1000 mL
  Filled 2016-11-28: qty 1000

## 2016-11-28 MED ORDER — DOPAMINE-DEXTROSE 3.2-5 MG/ML-% IV SOLN
0.0000 ug/kg/min | INTRAVENOUS | Status: DC
  Filled 2016-11-28: qty 250

## 2016-11-28 MED ORDER — NITROGLYCERIN IN D5W 200-5 MCG/ML-% IV SOLN
2.0000 ug/min | INTRAVENOUS | Status: AC
  Administered 2016-11-29: 10 ug/min via INTRAVENOUS
  Filled 2016-11-28: qty 250

## 2016-11-28 MED ORDER — KENNESTONE BLOOD CARDIOPLEGIA VIAL
13.0000 mL | Freq: Once | Status: DC
  Filled 2016-11-28: qty 1

## 2016-11-28 MED ORDER — TRANEXAMIC ACID 1000 MG/10ML IV SOLN
1.5000 mg/kg/h | INTRAVENOUS | Status: AC
  Administered 2016-11-29: 1.5 mg/kg/h via INTRAVENOUS
  Filled 2016-11-28: qty 25

## 2016-11-28 MED ORDER — POTASSIUM CHLORIDE 2 MEQ/ML IV SOLN
80.0000 meq | INTRAVENOUS | Status: DC
  Filled 2016-11-28: qty 40

## 2016-11-28 MED ORDER — TRANEXAMIC ACID (OHS) BOLUS VIA INFUSION
15.0000 mg/kg | INTRAVENOUS | Status: AC
  Administered 2016-11-29: 1816.5 mg via INTRAVENOUS
  Filled 2016-11-28: qty 1817

## 2016-11-28 MED ORDER — SODIUM CHLORIDE 0.9 % IV SOLN
INTRAVENOUS | Status: DC
  Filled 2016-11-28: qty 30

## 2016-11-28 MED ORDER — TRANEXAMIC ACID (OHS) PUMP PRIME SOLUTION
2.0000 mg/kg | INTRAVENOUS | Status: DC
  Filled 2016-11-28: qty 2.42

## 2016-11-28 MED ORDER — PLASMA-LYTE 148 IV SOLN
INTRAVENOUS | Status: AC
  Administered 2016-11-29: 500 mL
  Filled 2016-11-28: qty 2.5

## 2016-11-28 MED ORDER — MAGNESIUM SULFATE 50 % IJ SOLN
40.0000 meq | INTRAMUSCULAR | Status: DC
  Filled 2016-11-28: qty 10

## 2016-11-28 MED ORDER — VANCOMYCIN HCL 10 G IV SOLR
1500.0000 mg | INTRAVENOUS | Status: AC
  Administered 2016-11-29: 1500 mg via INTRAVENOUS
  Filled 2016-11-28: qty 1500

## 2016-11-28 MED ORDER — SODIUM CHLORIDE 0.9 % IV SOLN
INTRAVENOUS | Status: AC
  Administered 2016-11-29: 1.3 [IU]/h via INTRAVENOUS
  Filled 2016-11-28: qty 1

## 2016-11-28 MED ORDER — DEXTROSE 5 % IV SOLN
1.5000 g | INTRAVENOUS | Status: AC
  Administered 2016-11-29: 1.5 g via INTRAVENOUS
  Administered 2016-11-29: .75 g via INTRAVENOUS
  Filled 2016-11-28: qty 1.5

## 2016-11-28 MED ORDER — DEXMEDETOMIDINE HCL IN NACL 400 MCG/100ML IV SOLN
0.1000 ug/kg/h | INTRAVENOUS | Status: AC
  Administered 2016-11-29: .3 ug/kg/h via INTRAVENOUS
  Filled 2016-11-28: qty 100

## 2016-11-28 MED ORDER — DEXTROSE 5 % IV SOLN
750.0000 mg | INTRAVENOUS | Status: DC
  Filled 2016-11-28: qty 750

## 2016-11-28 MED ORDER — EPINEPHRINE PF 1 MG/ML IJ SOLN
0.0000 ug/min | INTRAVENOUS | Status: DC
  Filled 2016-11-28: qty 4

## 2016-11-28 NOTE — Anesthesia Preprocedure Evaluation (Addendum)
Anesthesia Evaluation  Patient identified by MRN, date of birth, ID band Patient awake    Reviewed: Allergy & Precautions, H&P , NPO status , Patient's Chart, lab work & pertinent test results  Airway Mallampati: III  TM Distance: >3 FB Neck ROM: Full    Dental no notable dental hx. (+) Teeth Intact, Dental Advisory Given   Pulmonary COPD,  COPD inhaler, former smoker,    Pulmonary exam normal breath sounds clear to auscultation       Cardiovascular Exercise Tolerance: Good hypertension, Pt. on medications and Pt. on home beta blockers + angina + CAD  + Valvular Problems/Murmurs AS  Rhythm:Regular Rate:Normal     Neuro/Psych negative neurological ROS  negative psych ROS   GI/Hepatic Neg liver ROS, GERD  Medicated and Controlled,  Endo/Other  Hypothyroidism Morbid obesity  Renal/GU negative Renal ROS  negative genitourinary   Musculoskeletal   Abdominal   Peds  Hematology negative hematology ROS (+)   Anesthesia Other Findings   Reproductive/Obstetrics negative OB ROS                            Anesthesia Physical Anesthesia Plan  ASA: IV  Anesthesia Plan: General   Post-op Pain Management:    Induction: Intravenous  PONV Risk Score and Plan: 2 and Midazolam and Treatment may vary due to age or medical condition  Airway Management Planned: Oral ETT  Additional Equipment: Arterial line, CVP, PA Cath, TEE and Ultrasound Guidance Line Placement  Intra-op Plan:   Post-operative Plan: Post-operative intubation/ventilation  Informed Consent: I have reviewed the patients History and Physical, chart, labs and discussed the procedure including the risks, benefits and alternatives for the proposed anesthesia with the patient or authorized representative who has indicated his/her understanding and acceptance.   Dental advisory given  Plan Discussed with: CRNA  Anesthesia Plan  Comments:        Anesthesia Quick Evaluation

## 2016-11-29 ENCOUNTER — Inpatient Hospital Stay (HOSPITAL_COMMUNITY): Payer: Managed Care, Other (non HMO) | Admitting: Vascular Surgery

## 2016-11-29 ENCOUNTER — Encounter (HOSPITAL_COMMUNITY): Payer: Self-pay | Admitting: Thoracic Surgery (Cardiothoracic Vascular Surgery)

## 2016-11-29 ENCOUNTER — Inpatient Hospital Stay (HOSPITAL_COMMUNITY): Payer: Managed Care, Other (non HMO)

## 2016-11-29 ENCOUNTER — Inpatient Hospital Stay (HOSPITAL_COMMUNITY)
Admission: RE | Admit: 2016-11-29 | Discharge: 2016-12-06 | DRG: 220 | Disposition: A | Discharge status: Home / Self Care | Payer: Managed Care, Other (non HMO) | Source: Ambulatory Visit | Attending: Thoracic Surgery (Cardiothoracic Vascular Surgery) | Admitting: Thoracic Surgery (Cardiothoracic Vascular Surgery)

## 2016-11-29 ENCOUNTER — Inpatient Hospital Stay (HOSPITAL_COMMUNITY): Payer: Managed Care, Other (non HMO) | Admitting: Certified Registered Nurse Anesthetist

## 2016-11-29 ENCOUNTER — Encounter (HOSPITAL_COMMUNITY)
Admission: RE | Disposition: A | Discharge status: Home / Self Care | Payer: Self-pay | Source: Ambulatory Visit | Attending: Thoracic Surgery (Cardiothoracic Vascular Surgery)

## 2016-11-29 ENCOUNTER — Ambulatory Visit (HOSPITAL_COMMUNITY): Payer: Managed Care, Other (non HMO) | Attending: Thoracic Surgery (Cardiothoracic Vascular Surgery)

## 2016-11-29 DIAGNOSIS — E039 Hypothyroidism, unspecified: Secondary | ICD-10-CM | POA: Diagnosis present

## 2016-11-29 DIAGNOSIS — Z9221 Personal history of antineoplastic chemotherapy: Secondary | ICD-10-CM

## 2016-11-29 DIAGNOSIS — I272 Pulmonary hypertension, unspecified: Secondary | ICD-10-CM | POA: Diagnosis present

## 2016-11-29 DIAGNOSIS — I2510 Atherosclerotic heart disease of native coronary artery without angina pectoris: Secondary | ICD-10-CM | POA: Diagnosis not present

## 2016-11-29 DIAGNOSIS — Z951 Presence of aortocoronary bypass graft: Secondary | ICD-10-CM

## 2016-11-29 DIAGNOSIS — I77810 Thoracic aortic ectasia: Secondary | ICD-10-CM | POA: Diagnosis present

## 2016-11-29 DIAGNOSIS — E785 Hyperlipidemia, unspecified: Secondary | ICD-10-CM | POA: Diagnosis present

## 2016-11-29 DIAGNOSIS — I350 Nonrheumatic aortic (valve) stenosis: Secondary | ICD-10-CM | POA: Insufficient documentation

## 2016-11-29 DIAGNOSIS — K219 Gastro-esophageal reflux disease without esophagitis: Secondary | ICD-10-CM | POA: Diagnosis present

## 2016-11-29 DIAGNOSIS — Z87891 Personal history of nicotine dependence: Secondary | ICD-10-CM

## 2016-11-29 DIAGNOSIS — K5900 Constipation, unspecified: Secondary | ICD-10-CM | POA: Diagnosis present

## 2016-11-29 DIAGNOSIS — I480 Paroxysmal atrial fibrillation: Secondary | ICD-10-CM | POA: Diagnosis not present

## 2016-11-29 DIAGNOSIS — J449 Chronic obstructive pulmonary disease, unspecified: Secondary | ICD-10-CM | POA: Diagnosis present

## 2016-11-29 DIAGNOSIS — I352 Nonrheumatic aortic (valve) stenosis with insufficiency: Principal | ICD-10-CM | POA: Diagnosis present

## 2016-11-29 DIAGNOSIS — Z79899 Other long term (current) drug therapy: Secondary | ICD-10-CM

## 2016-11-29 DIAGNOSIS — Z6838 Body mass index (BMI) 38.0-38.9, adult: Secondary | ICD-10-CM | POA: Diagnosis not present

## 2016-11-29 DIAGNOSIS — D696 Thrombocytopenia, unspecified: Secondary | ICD-10-CM | POA: Diagnosis present

## 2016-11-29 DIAGNOSIS — N529 Male erectile dysfunction, unspecified: Secondary | ICD-10-CM | POA: Diagnosis present

## 2016-11-29 DIAGNOSIS — J9811 Atelectasis: Secondary | ICD-10-CM | POA: Diagnosis not present

## 2016-11-29 DIAGNOSIS — I10 Essential (primary) hypertension: Secondary | ICD-10-CM | POA: Diagnosis present

## 2016-11-29 DIAGNOSIS — D62 Acute posthemorrhagic anemia: Secondary | ICD-10-CM | POA: Diagnosis not present

## 2016-11-29 DIAGNOSIS — I4892 Unspecified atrial flutter: Secondary | ICD-10-CM | POA: Diagnosis not present

## 2016-11-29 DIAGNOSIS — D6869 Other thrombophilia: Secondary | ICD-10-CM | POA: Diagnosis present

## 2016-11-29 DIAGNOSIS — Z85528 Personal history of other malignant neoplasm of kidney: Secondary | ICD-10-CM

## 2016-11-29 DIAGNOSIS — I110 Hypertensive heart disease with heart failure: Secondary | ICD-10-CM | POA: Diagnosis present

## 2016-11-29 DIAGNOSIS — J939 Pneumothorax, unspecified: Secondary | ICD-10-CM

## 2016-11-29 DIAGNOSIS — Z9689 Presence of other specified functional implants: Secondary | ICD-10-CM

## 2016-11-29 DIAGNOSIS — E119 Type 2 diabetes mellitus without complications: Secondary | ICD-10-CM | POA: Diagnosis present

## 2016-11-29 DIAGNOSIS — D6862 Lupus anticoagulant syndrome: Secondary | ICD-10-CM | POA: Diagnosis present

## 2016-11-29 DIAGNOSIS — Z09 Encounter for follow-up examination after completed treatment for conditions other than malignant neoplasm: Secondary | ICD-10-CM

## 2016-11-29 DIAGNOSIS — Z905 Acquired absence of kidney: Secondary | ICD-10-CM

## 2016-11-29 DIAGNOSIS — E669 Obesity, unspecified: Secondary | ICD-10-CM | POA: Diagnosis present

## 2016-11-29 DIAGNOSIS — Z954 Presence of other heart-valve replacement: Secondary | ICD-10-CM

## 2016-11-29 DIAGNOSIS — Z7901 Long term (current) use of anticoagulants: Secondary | ICD-10-CM | POA: Diagnosis not present

## 2016-11-29 DIAGNOSIS — I5032 Chronic diastolic (congestive) heart failure: Secondary | ICD-10-CM | POA: Diagnosis present

## 2016-11-29 DIAGNOSIS — Z8571 Personal history of Hodgkin lymphoma: Secondary | ICD-10-CM

## 2016-11-29 HISTORY — PX: CORONARY ARTERY BYPASS GRAFT: SHX141

## 2016-11-29 HISTORY — PX: TEE WITHOUT CARDIOVERSION: SHX5443

## 2016-11-29 HISTORY — DX: Presence of other heart-valve replacement: Z95.4

## 2016-11-29 HISTORY — PX: AORTIC VALVE REPLACEMENT: SHX41

## 2016-11-29 HISTORY — DX: Presence of aortocoronary bypass graft: Z95.1

## 2016-11-29 LAB — POCT I-STAT, CHEM 8
BUN: 22 mg/dL — ABNORMAL HIGH (ref 6–20)
BUN: 22 mg/dL — AB (ref 6–20)
BUN: 22 mg/dL — AB (ref 6–20)
BUN: 22 mg/dL — AB (ref 6–20)
BUN: 23 mg/dL — ABNORMAL HIGH (ref 6–20)
BUN: 22 mg/dL — ABNORMAL HIGH (ref 6–20)
BUN: 21 mg/dL — AB (ref 6–20)
CALCIUM ION: 1.06 mmol/L — AB (ref 1.15–1.40)
CALCIUM ION: 1.2 mmol/L (ref 1.15–1.40)
CALCIUM ION: 1.05 mmol/L — AB (ref 1.15–1.40)
CALCIUM ION: 1.12 mmol/L — AB (ref 1.15–1.40)
CHLORIDE: 102 mmol/L (ref 101–111)
CHLORIDE: 103 mmol/L (ref 101–111)
CHLORIDE: 105 mmol/L (ref 101–111)
CREATININE: 0.8 mg/dL (ref 0.61–1.24)
CREATININE: 0.8 mg/dL (ref 0.61–1.24)
CREATININE: 0.8 mg/dL (ref 0.61–1.24)
Calcium, Ion: 1.16 mmol/L (ref 1.15–1.40)
Calcium, Ion: 1.05 mmol/L — ABNORMAL LOW (ref 1.15–1.40)
Calcium, Ion: 1.04 mmol/L — ABNORMAL LOW (ref 1.15–1.40)
Chloride: 101 mmol/L (ref 101–111)
Chloride: 102 mmol/L (ref 101–111)
Chloride: 103 mmol/L (ref 101–111)
Chloride: 104 mmol/L (ref 101–111)
Creatinine, Ser: 0.8 mg/dL (ref 0.61–1.24)
Creatinine, Ser: 0.8 mg/dL (ref 0.61–1.24)
Creatinine, Ser: 0.7 mg/dL (ref 0.61–1.24)
Creatinine, Ser: 0.8 mg/dL (ref 0.61–1.24)
GLUCOSE: 125 mg/dL — AB (ref 65–99)
GLUCOSE: 122 mg/dL — AB (ref 65–99)
GLUCOSE: 178 mg/dL — AB (ref 65–99)
GLUCOSE: 131 mg/dL — AB (ref 65–99)
Glucose, Bld: 124 mg/dL — ABNORMAL HIGH (ref 65–99)
Glucose, Bld: 175 mg/dL — ABNORMAL HIGH (ref 65–99)
Glucose, Bld: 180 mg/dL — ABNORMAL HIGH (ref 65–99)
HCT: 36 % — ABNORMAL LOW (ref 39.0–52.0)
HCT: 29 % — ABNORMAL LOW (ref 39.0–52.0)
HCT: 34 % — ABNORMAL LOW (ref 39.0–52.0)
HCT: 28 % — ABNORMAL LOW (ref 39.0–52.0)
HCT: 29 % — ABNORMAL LOW (ref 39.0–52.0)
HCT: 31 % — ABNORMAL LOW (ref 39.0–52.0)
HEMATOCRIT: 30 % — AB (ref 39.0–52.0)
HEMOGLOBIN: 12.2 g/dL — AB (ref 13.0–17.0)
HEMOGLOBIN: 10.2 g/dL — AB (ref 13.0–17.0)
HEMOGLOBIN: 9.9 g/dL — AB (ref 13.0–17.0)
Hemoglobin: 11.6 g/dL — ABNORMAL LOW (ref 13.0–17.0)
Hemoglobin: 9.9 g/dL — ABNORMAL LOW (ref 13.0–17.0)
Hemoglobin: 9.5 g/dL — ABNORMAL LOW (ref 13.0–17.0)
Hemoglobin: 10.5 g/dL — ABNORMAL LOW (ref 13.0–17.0)
POTASSIUM: 4.4 mmol/L (ref 3.5–5.1)
POTASSIUM: 5.1 mmol/L (ref 3.5–5.1)
Potassium: 4.9 mmol/L (ref 3.5–5.1)
Potassium: 5 mmol/L (ref 3.5–5.1)
Potassium: 6 mmol/L — ABNORMAL HIGH (ref 3.5–5.1)
Potassium: 6.4 mmol/L (ref 3.5–5.1)
Potassium: 4.9 mmol/L (ref 3.5–5.1)
SODIUM: 135 mmol/L (ref 135–145)
SODIUM: 135 mmol/L (ref 135–145)
Sodium: 139 mmol/L (ref 135–145)
Sodium: 138 mmol/L (ref 135–145)
Sodium: 138 mmol/L (ref 135–145)
Sodium: 137 mmol/L (ref 135–145)
Sodium: 138 mmol/L (ref 135–145)
TCO2: 27 mmol/L (ref 0–100)
TCO2: 27 mmol/L (ref 0–100)
TCO2: 28 mmol/L (ref 0–100)
TCO2: 28 mmol/L (ref 0–100)
TCO2: 25 mmol/L (ref 0–100)
TCO2: 27 mmol/L (ref 0–100)
TCO2: 22 mmol/L (ref 0–100)

## 2016-11-29 LAB — POCT I-STAT 4, (NA,K, GLUC, HGB,HCT)
Glucose, Bld: 119 mg/dL — ABNORMAL HIGH (ref 65–99)
HEMATOCRIT: 30 % — AB (ref 39.0–52.0)
Hemoglobin: 10.2 g/dL — ABNORMAL LOW (ref 13.0–17.0)
Potassium: 4.8 mmol/L (ref 3.5–5.1)
SODIUM: 139 mmol/L (ref 135–145)

## 2016-11-29 LAB — GLUCOSE, CAPILLARY
GLUCOSE-CAPILLARY: 114 mg/dL — AB (ref 65–99)
GLUCOSE-CAPILLARY: 130 mg/dL — AB (ref 65–99)
GLUCOSE-CAPILLARY: 69 mg/dL (ref 65–99)
GLUCOSE-CAPILLARY: 142 mg/dL — AB (ref 65–99)
GLUCOSE-CAPILLARY: 130 mg/dL — AB (ref 65–99)
GLUCOSE-CAPILLARY: 134 mg/dL — AB (ref 65–99)
Glucose-Capillary: 138 mg/dL — ABNORMAL HIGH (ref 65–99)
Glucose-Capillary: 158 mg/dL — ABNORMAL HIGH (ref 65–99)
Glucose-Capillary: 123 mg/dL — ABNORMAL HIGH (ref 65–99)
Glucose-Capillary: 148 mg/dL — ABNORMAL HIGH (ref 65–99)
Glucose-Capillary: 148 mg/dL — ABNORMAL HIGH (ref 65–99)

## 2016-11-29 LAB — CBC
HCT: 33 % — ABNORMAL LOW (ref 39.0–52.0)
HEMATOCRIT: 32.3 % — AB (ref 39.0–52.0)
HEMOGLOBIN: 10.8 g/dL — AB (ref 13.0–17.0)
HEMOGLOBIN: 10.4 g/dL — AB (ref 13.0–17.0)
MCH: 27.6 pg (ref 26.0–34.0)
MCH: 27.3 pg (ref 26.0–34.0)
MCHC: 32.7 g/dL (ref 30.0–36.0)
MCHC: 32.2 g/dL (ref 30.0–36.0)
MCV: 84.2 fL (ref 78.0–100.0)
MCV: 84.8 fL (ref 78.0–100.0)
Platelets: 121 10*3/uL — ABNORMAL LOW (ref 150–400)
Platelets: 112 10*3/uL — ABNORMAL LOW (ref 150–400)
RBC: 3.92 MIL/uL — AB (ref 4.22–5.81)
RBC: 3.81 MIL/uL — ABNORMAL LOW (ref 4.22–5.81)
RDW: 14.4 % (ref 11.5–15.5)
RDW: 14.3 % (ref 11.5–15.5)
WBC: 11.3 10*3/uL — AB (ref 4.0–10.5)
WBC: 9 10*3/uL (ref 4.0–10.5)

## 2016-11-29 LAB — POCT I-STAT 3, ART BLOOD GAS (G3+)
ACID-BASE DEFICIT: 1 mmol/L (ref 0.0–2.0)
ACID-BASE DEFICIT: 3 mmol/L — AB (ref 0.0–2.0)
ACID-BASE DEFICIT: 5 mmol/L — AB (ref 0.0–2.0)
ACID-BASE DEFICIT: 4 mmol/L — AB (ref 0.0–2.0)
ACID-BASE EXCESS: 2 mmol/L (ref 0.0–2.0)
BICARBONATE: 25.9 mmol/L (ref 20.0–28.0)
BICARBONATE: 25.7 mmol/L (ref 20.0–28.0)
BICARBONATE: 23 mmol/L (ref 20.0–28.0)
BICARBONATE: 21.2 mmol/L (ref 20.0–28.0)
BICARBONATE: 22.6 mmol/L (ref 20.0–28.0)
Bicarbonate: 28.3 mmol/L — ABNORMAL HIGH (ref 20.0–28.0)
O2 SAT: 100 %
O2 SAT: 97 %
O2 SAT: 99 %
O2 SAT: 98 %
O2 Saturation: 100 %
O2 Saturation: 96 %
PCO2 ART: 51.9 mmHg — AB (ref 32.0–48.0)
PCO2 ART: 43 mmHg (ref 32.0–48.0)
PCO2 ART: 42 mmHg (ref 32.0–48.0)
PH ART: 7.368 (ref 7.350–7.450)
PH ART: 7.312 — AB (ref 7.350–7.450)
PO2 ART: 100 mmHg (ref 83.0–108.0)
PO2 ART: 143 mmHg — AB (ref 83.0–108.0)
PO2 ART: 120 mmHg — AB (ref 83.0–108.0)
Patient temperature: 36.7
Patient temperature: 36.9
Patient temperature: 37
TCO2: 30 mmol/L (ref 0–100)
TCO2: 27 mmol/L (ref 0–100)
TCO2: 27 mmol/L (ref 0–100)
TCO2: 24 mmol/L (ref 0–100)
TCO2: 23 mmol/L (ref 0–100)
TCO2: 24 mmol/L (ref 0–100)
pCO2 arterial: 53.6 mmHg — ABNORMAL HIGH (ref 32.0–48.0)
pCO2 arterial: 45.1 mmHg (ref 32.0–48.0)
pCO2 arterial: 45 mmHg (ref 32.0–48.0)
pH, Arterial: 7.331 — ABNORMAL LOW (ref 7.350–7.450)
pH, Arterial: 7.302 — ABNORMAL LOW (ref 7.350–7.450)
pH, Arterial: 7.335 — ABNORMAL LOW (ref 7.350–7.450)
pH, Arterial: 7.308 — ABNORMAL LOW (ref 7.350–7.450)
pO2, Arterial: 347 mmHg — ABNORMAL HIGH (ref 83.0–108.0)
pO2, Arterial: 403 mmHg — ABNORMAL HIGH (ref 83.0–108.0)
pO2, Arterial: 88 mmHg (ref 83.0–108.0)

## 2016-11-29 LAB — ECHO INTRAOPERATIVE TEE: WEIGHTICAEL: 4272 [oz_av]

## 2016-11-29 LAB — CREATININE, SERUM
Creatinine, Ser: 0.92 mg/dL (ref 0.61–1.24)
GFR calc Af Amer: >60 mL/min (ref >60)

## 2016-11-29 LAB — HEMOGLOBIN AND HEMATOCRIT, BLOOD
HCT: 30.2 % — ABNORMAL LOW (ref 39.0–52.0)
Hemoglobin: 9.8 g/dL — ABNORMAL LOW (ref 13.0–17.0)

## 2016-11-29 LAB — PROTIME-INR
INR: 1.35
Prothrombin Time: 16.7 seconds — ABNORMAL HIGH (ref 11.4–15.2)

## 2016-11-29 LAB — APTT: aPTT: 28 seconds (ref 24–36)

## 2016-11-29 LAB — MAGNESIUM: Magnesium: 3.2 mg/dL — ABNORMAL HIGH (ref 1.7–2.4)

## 2016-11-29 LAB — PLATELET COUNT: Platelets: 144 10*3/uL — ABNORMAL LOW (ref 150–400)

## 2016-11-29 SURGERY — REPLACEMENT, AORTIC VALVE, OPEN
Anesthesia: General | Site: Chest

## 2016-11-29 MED ORDER — CHLORHEXIDINE GLUCONATE CLOTH 2 % EX PADS
6.0000 | MEDICATED_PAD | Freq: Every day | CUTANEOUS | Status: AC
  Administered 2016-11-29: 6 via TOPICAL

## 2016-11-29 MED ORDER — MORPHINE SULFATE (PF) 4 MG/ML IV SOLN
1.0000 mg | INTRAVENOUS | Status: AC | PRN
  Administered 2016-11-29: 4 mg via INTRAVENOUS
  Filled 2016-11-29: qty 1

## 2016-11-29 MED ORDER — ROCURONIUM BROMIDE 10 MG/ML (PF) SYRINGE
PREFILLED_SYRINGE | INTRAVENOUS | Status: AC
  Filled 2016-11-29: qty 5

## 2016-11-29 MED ORDER — MIDAZOLAM HCL 5 MG/5ML IJ SOLN
INTRAMUSCULAR | Status: DC | PRN
  Administered 2016-11-29 (×3): 2 mg via INTRAVENOUS
  Administered 2016-11-29: 3 mg via INTRAVENOUS
  Administered 2016-11-29: 1 mg via INTRAVENOUS

## 2016-11-29 MED ORDER — TRAMADOL HCL 50 MG PO TABS
50.0000 mg | ORAL_TABLET | ORAL | Status: DC | PRN
  Administered 2016-11-30 (×3): 100 mg via ORAL
  Filled 2016-11-29 (×3): qty 2

## 2016-11-29 MED ORDER — DEXMEDETOMIDINE HCL IN NACL 200 MCG/50ML IV SOLN
INTRAVENOUS | Status: AC
  Filled 2016-11-29: qty 50

## 2016-11-29 MED ORDER — INSULIN REGULAR HUMAN 100 UNIT/ML IJ SOLN
INTRAMUSCULAR | Status: DC
  Administered 2016-11-29: 2.2 [IU]/h via INTRAVENOUS
  Filled 2016-11-29: qty 1

## 2016-11-29 MED ORDER — ASPIRIN 81 MG PO CHEW
324.0000 mg | CHEWABLE_TABLET | Freq: Every day | ORAL | Status: DC
  Administered 2016-12-01: 324 mg

## 2016-11-29 MED ORDER — FAMOTIDINE IN NACL 20-0.9 MG/50ML-% IV SOLN
20.0000 mg | Freq: Two times a day (BID) | INTRAVENOUS | Status: AC
  Administered 2016-11-29: 20 mg via INTRAVENOUS

## 2016-11-29 MED ORDER — HEPARIN SODIUM (PORCINE) 1000 UNIT/ML IJ SOLN
INTRAMUSCULAR | Status: AC
  Filled 2016-11-29: qty 2

## 2016-11-29 MED ORDER — SODIUM CHLORIDE 0.9 % IV SOLN
INTRAVENOUS | Status: DC
  Administered 2016-11-29: 15:00:00 via INTRAVENOUS

## 2016-11-29 MED ORDER — SODIUM CHLORIDE 0.9 % IV SOLN
INTRAVENOUS | Status: AC
  Administered 2016-11-29: 100 mL/h via INTRAVENOUS

## 2016-11-29 MED ORDER — SODIUM CHLORIDE 0.9 % IV SOLN
0.0000 ug/kg/h | INTRAVENOUS | Status: DC
  Administered 2016-11-29: 0.5 ug/kg/h via INTRAVENOUS
  Administered 2016-11-29: 0.1 ug/kg/h via INTRAVENOUS
  Filled 2016-11-29: qty 2

## 2016-11-29 MED ORDER — DOCUSATE SODIUM 100 MG PO CAPS
200.0000 mg | ORAL_CAPSULE | Freq: Every day | ORAL | Status: DC
  Administered 2016-11-30 – 2016-12-05 (×5): 200 mg via ORAL
  Filled 2016-11-29 (×6): qty 2

## 2016-11-29 MED ORDER — FENTANYL CITRATE (PF) 250 MCG/5ML IJ SOLN
INTRAMUSCULAR | Status: AC
  Filled 2016-11-29: qty 25

## 2016-11-29 MED ORDER — ASPIRIN EC 325 MG PO TBEC
325.0000 mg | DELAYED_RELEASE_TABLET | Freq: Every day | ORAL | Status: DC
  Administered 2016-11-30: 325 mg via ORAL
  Filled 2016-11-29 (×2): qty 1

## 2016-11-29 MED ORDER — ORAL CARE MOUTH RINSE
15.0000 mL | Freq: Two times a day (BID) | OROMUCOSAL | Status: DC
  Administered 2016-11-30 – 2016-12-05 (×3): 15 mL via OROMUCOSAL

## 2016-11-29 MED ORDER — SODIUM CHLORIDE 0.45 % IV SOLN
INTRAVENOUS | Status: DC | PRN
  Administered 2016-11-29: 15:00:00 via INTRAVENOUS

## 2016-11-29 MED ORDER — PROPOFOL 10 MG/ML IV BOLUS
INTRAVENOUS | Status: DC | PRN
  Administered 2016-11-29: 20 mg via INTRAVENOUS
  Administered 2016-11-29: 50 mg via INTRAVENOUS

## 2016-11-29 MED ORDER — INSULIN REGULAR BOLUS VIA INFUSION
0.0000 [IU] | Freq: Three times a day (TID) | INTRAVENOUS | Status: DC
  Filled 2016-11-29: qty 10

## 2016-11-29 MED ORDER — SODIUM CHLORIDE 0.9% FLUSH: 3.0000 mL | INTRAVENOUS | Status: DC | PRN

## 2016-11-29 MED ORDER — HEPARIN SODIUM (PORCINE) 1000 UNIT/ML IJ SOLN
INTRAMUSCULAR | Status: DC | PRN
  Administered 2016-11-29: 43000 [IU] via INTRAVENOUS

## 2016-11-29 MED ORDER — LEVOTHYROXINE SODIUM 50 MCG PO TABS
50.0000 ug | ORAL_TABLET | Freq: Every day | ORAL | Status: DC
  Administered 2016-11-30 – 2016-12-02 (×3): 50 ug via ORAL
  Filled 2016-11-29 (×3): qty 1

## 2016-11-29 MED ORDER — LACTATED RINGERS IV SOLN
INTRAVENOUS | Status: DC | PRN
  Administered 2016-11-29 (×2): via INTRAVENOUS

## 2016-11-29 MED ORDER — DIPHENHYDRAMINE HCL 50 MG/ML IJ SOLN
INTRAMUSCULAR | Status: AC
  Filled 2016-11-29: qty 1

## 2016-11-29 MED ORDER — SODIUM CHLORIDE 0.9 % IV SOLN
0.0000 ug/min | INTRAVENOUS | Status: DC
  Filled 2016-11-29: qty 2

## 2016-11-29 MED ORDER — ALBUMIN HUMAN 5 % IV SOLN
INTRAVENOUS | Status: DC | PRN
  Administered 2016-11-29: 12:00:00 via INTRAVENOUS

## 2016-11-29 MED ORDER — BISACODYL 10 MG RE SUPP: 10.0000 mg | Freq: Every day | RECTAL | Status: DC

## 2016-11-29 MED ORDER — LACTATED RINGERS IV SOLN: INTRAVENOUS | Status: DC

## 2016-11-29 MED ORDER — METOPROLOL TARTRATE 12.5 MG HALF TABLET
12.5000 mg | ORAL_TABLET | Freq: Two times a day (BID) | ORAL | Status: DC
  Administered 2016-11-30 – 2016-12-01 (×3): 12.5 mg via ORAL
  Filled 2016-11-29 (×4): qty 1

## 2016-11-29 MED ORDER — MIDAZOLAM HCL 10 MG/2ML IJ SOLN
INTRAMUSCULAR | Status: AC
  Filled 2016-11-29: qty 2

## 2016-11-29 MED ORDER — METOPROLOL TARTRATE 12.5 MG HALF TABLET: 12.5000 mg | ORAL_TABLET | Freq: Once | ORAL | Status: DC

## 2016-11-29 MED ORDER — DEXTROSE 5 % IV SOLN
1.5000 g | Freq: Two times a day (BID) | INTRAVENOUS | Status: AC
  Administered 2016-11-30 – 2016-12-01 (×4): 1.5 g via INTRAVENOUS
  Filled 2016-11-29 (×4): qty 1.5

## 2016-11-29 MED ORDER — MORPHINE SULFATE (PF) 2 MG/ML IV SOLN: 1.0000 mg | INTRAVENOUS | Status: DC | PRN

## 2016-11-29 MED ORDER — SODIUM CHLORIDE 0.9 % IR SOLN
Status: DC | PRN
  Administered 2016-11-29: 5000 mL

## 2016-11-29 MED ORDER — LACTATED RINGERS IV SOLN
INTRAVENOUS | Status: DC | PRN
  Administered 2016-11-29: 07:00:00 via INTRAVENOUS

## 2016-11-29 MED ORDER — ROCURONIUM BROMIDE 10 MG/ML (PF) SYRINGE
PREFILLED_SYRINGE | INTRAVENOUS | Status: DC | PRN
  Administered 2016-11-29: 70 mg via INTRAVENOUS
  Administered 2016-11-29 (×3): 50 mg via INTRAVENOUS
  Administered 2016-11-29: 30 mg via INTRAVENOUS

## 2016-11-29 MED ORDER — SODIUM CHLORIDE 0.9% FLUSH: 10.0000 mL | INTRAVENOUS | Status: DC | PRN

## 2016-11-29 MED ORDER — ARTIFICIAL TEARS OPHTHALMIC OINT
TOPICAL_OINTMENT | OPHTHALMIC | Status: AC
  Filled 2016-11-29: qty 7

## 2016-11-29 MED ORDER — OXYCODONE HCL 5 MG PO TABS
5.0000 mg | ORAL_TABLET | ORAL | Status: DC | PRN
  Administered 2016-11-29: 5 mg via ORAL
  Administered 2016-11-29 – 2016-12-04 (×14): 10 mg via ORAL
  Administered 2016-12-05: 5 mg via ORAL
  Administered 2016-12-05: 10 mg via ORAL
  Administered 2016-12-06: 5 mg via ORAL
  Filled 2016-11-29 (×3): qty 2
  Filled 2016-11-29: qty 1
  Filled 2016-11-29 (×11): qty 2
  Filled 2016-11-29: qty 1
  Filled 2016-11-29 (×2): qty 2
  Filled 2016-11-29: qty 1

## 2016-11-29 MED ORDER — BISACODYL 5 MG PO TBEC
10.0000 mg | DELAYED_RELEASE_TABLET | Freq: Every day | ORAL | Status: DC
  Administered 2016-11-30 – 2016-12-02 (×3): 10 mg via ORAL
  Filled 2016-11-29 (×2): qty 2

## 2016-11-29 MED ORDER — FENTANYL CITRATE (PF) 250 MCG/5ML IJ SOLN
INTRAMUSCULAR | Status: DC | PRN
  Administered 2016-11-29: 100 ug via INTRAVENOUS
  Administered 2016-11-29: 50 ug via INTRAVENOUS
  Administered 2016-11-29: 150 ug via INTRAVENOUS
  Administered 2016-11-29: 50 ug via INTRAVENOUS
  Administered 2016-11-29: 600 ug via INTRAVENOUS
  Administered 2016-11-29: 150 ug via INTRAVENOUS
  Administered 2016-11-29: 100 ug via INTRAVENOUS
  Administered 2016-11-29: 50 ug via INTRAVENOUS

## 2016-11-29 MED ORDER — DEXTROSE 50 % IV SOLN
INTRAVENOUS | Status: AC
  Filled 2016-11-29: qty 50

## 2016-11-29 MED ORDER — ACETAMINOPHEN 500 MG PO TABS
1000.0000 mg | ORAL_TABLET | Freq: Four times a day (QID) | ORAL | Status: AC
  Administered 2016-11-30 – 2016-12-04 (×14): 1000 mg via ORAL
  Filled 2016-11-29 (×15): qty 2

## 2016-11-29 MED ORDER — LACTATED RINGERS IV SOLN
500.0000 mL | Freq: Once | INTRAVENOUS | Status: AC | PRN
  Administered 2016-11-29: 500 mL via INTRAVENOUS

## 2016-11-29 MED ORDER — SODIUM CHLORIDE 0.9% FLUSH
3.0000 mL | Freq: Two times a day (BID) | INTRAVENOUS | Status: DC
  Administered 2016-11-30: 10 mL via INTRAVENOUS
  Administered 2016-12-01 – 2016-12-02 (×4): 3 mL via INTRAVENOUS

## 2016-11-29 MED ORDER — MAGNESIUM SULFATE 4 GM/100ML IV SOLN
4.0000 g | Freq: Once | INTRAVENOUS | Status: AC
  Administered 2016-11-29: 4 g via INTRAVENOUS
  Filled 2016-11-29: qty 100

## 2016-11-29 MED ORDER — CHLORHEXIDINE GLUCONATE 0.12 % MT SOLN
15.0000 mL | OROMUCOSAL | Status: AC
  Administered 2016-11-29: 15 mL via OROMUCOSAL

## 2016-11-29 MED ORDER — MIDAZOLAM HCL 2 MG/2ML IJ SOLN: 2.0000 mg | INTRAMUSCULAR | Status: DC | PRN

## 2016-11-29 MED ORDER — CHLORHEXIDINE GLUCONATE CLOTH 2 % EX PADS
6.0000 | MEDICATED_PAD | Freq: Every day | CUTANEOUS | Status: DC
  Administered 2016-11-30 – 2016-12-02 (×3): 6 via TOPICAL

## 2016-11-29 MED ORDER — LACTATED RINGERS IV SOLN
INTRAVENOUS | Status: DC
  Administered 2016-11-29: 10 mL/h via INTRAVENOUS

## 2016-11-29 MED ORDER — METOPROLOL TARTRATE 5 MG/5ML IV SOLN: 2.5000 mg | INTRAVENOUS | Status: DC | PRN

## 2016-11-29 MED ORDER — CHLORHEXIDINE GLUCONATE 0.12 % MT SOLN
OROMUCOSAL | Status: AC
  Filled 2016-11-29: qty 15

## 2016-11-29 MED ORDER — MORPHINE SULFATE (PF) 4 MG/ML IV SOLN
1.0000 mg | INTRAVENOUS | Status: DC | PRN
  Administered 2016-11-29 – 2016-11-30 (×4): 2 mg via INTRAVENOUS
  Filled 2016-11-29 (×4): qty 1

## 2016-11-29 MED ORDER — PROTAMINE SULFATE 10 MG/ML IV SOLN
INTRAVENOUS | Status: AC
  Filled 2016-11-29: qty 20

## 2016-11-29 MED ORDER — CHLORHEXIDINE GLUCONATE 4 % EX LIQD: 30.0000 mL | CUTANEOUS | Status: DC

## 2016-11-29 MED ORDER — CALCIUM CHLORIDE 10 % IV SOLN
INTRAVENOUS | Status: AC
  Filled 2016-11-29: qty 10

## 2016-11-29 MED ORDER — ACETAMINOPHEN 160 MG/5ML PO SOLN: 650.0000 mg | Freq: Once | ORAL | Status: AC

## 2016-11-29 MED ORDER — ROCURONIUM BROMIDE 10 MG/ML (PF) SYRINGE
PREFILLED_SYRINGE | INTRAVENOUS | Status: AC
  Filled 2016-11-29: qty 20

## 2016-11-29 MED ORDER — ACETAMINOPHEN 650 MG RE SUPP
650.0000 mg | Freq: Once | RECTAL | Status: AC
  Administered 2016-11-29: 650 mg via RECTAL

## 2016-11-29 MED ORDER — SODIUM CHLORIDE 0.9 % IJ SOLN
OROMUCOSAL | Status: DC | PRN
  Administered 2016-11-29 (×3): 4 mL via TOPICAL

## 2016-11-29 MED ORDER — PROTAMINE SULFATE 10 MG/ML IV SOLN
INTRAVENOUS | Status: AC
  Filled 2016-11-29: qty 50

## 2016-11-29 MED ORDER — PROPOFOL 10 MG/ML IV BOLUS
INTRAVENOUS | Status: AC
  Filled 2016-11-29: qty 20

## 2016-11-29 MED ORDER — HEPARIN SODIUM (PORCINE) 1000 UNIT/ML IJ SOLN
INTRAMUSCULAR | Status: AC
  Filled 2016-11-29: qty 3

## 2016-11-29 MED ORDER — SODIUM CHLORIDE 0.9 % IV SOLN: 250.0000 mL | INTRAVENOUS | Status: DC

## 2016-11-29 MED ORDER — ALBUMIN HUMAN 5 % IV SOLN
250.0000 mL | INTRAVENOUS | Status: AC | PRN
  Administered 2016-11-29 (×3): 250 mL via INTRAVENOUS
  Filled 2016-11-29 (×2): qty 250

## 2016-11-29 MED ORDER — NITROGLYCERIN IN D5W 200-5 MCG/ML-% IV SOLN
0.0000 ug/min | INTRAVENOUS | Status: DC
  Administered 2016-11-29: 20 ug/min via INTRAVENOUS

## 2016-11-29 MED ORDER — METOPROLOL TARTRATE 25 MG/10 ML ORAL SUSPENSION: 12.5000 mg | Freq: Two times a day (BID) | ORAL | Status: DC

## 2016-11-29 MED ORDER — ONDANSETRON HCL 4 MG/2ML IJ SOLN: 4.0000 mg | Freq: Four times a day (QID) | INTRAMUSCULAR | Status: DC | PRN

## 2016-11-29 MED ORDER — POTASSIUM CHLORIDE 10 MEQ/50ML IV SOLN: 10.0000 meq | INTRAVENOUS | Status: AC

## 2016-11-29 MED ORDER — PANTOPRAZOLE SODIUM 40 MG PO TBEC
40.0000 mg | DELAYED_RELEASE_TABLET | Freq: Every day | ORAL | Status: DC
  Administered 2016-12-01 – 2016-12-06 (×6): 40 mg via ORAL
  Filled 2016-11-29 (×6): qty 1

## 2016-11-29 MED ORDER — ORAL CARE MOUTH RINSE
15.0000 mL | Freq: Four times a day (QID) | OROMUCOSAL | Status: DC
  Administered 2016-11-29: 15 mL via OROMUCOSAL

## 2016-11-29 MED ORDER — VANCOMYCIN HCL IN DEXTROSE 1-5 GM/200ML-% IV SOLN
1000.0000 mg | Freq: Once | INTRAVENOUS | Status: AC
  Administered 2016-11-29: 1000 mg via INTRAVENOUS
  Filled 2016-11-29: qty 200

## 2016-11-29 MED ORDER — PHENYLEPHRINE 40 MCG/ML (10ML) SYRINGE FOR IV PUSH (FOR BLOOD PRESSURE SUPPORT)
PREFILLED_SYRINGE | INTRAVENOUS | Status: AC
  Filled 2016-11-29: qty 30

## 2016-11-29 MED ORDER — CHLORHEXIDINE GLUCONATE 0.12 % MT SOLN
15.0000 mL | Freq: Two times a day (BID) | OROMUCOSAL | Status: DC
  Administered 2016-11-30 – 2016-12-06 (×8): 15 mL via OROMUCOSAL
  Filled 2016-11-29 (×7): qty 15

## 2016-11-29 MED ORDER — PROTAMINE SULFATE 10 MG/ML IV SOLN
INTRAVENOUS | Status: DC | PRN
  Administered 2016-11-29: 10 mg via INTRAVENOUS
  Administered 2016-11-29: 390 mg via INTRAVENOUS

## 2016-11-29 MED ORDER — DEXAMETHASONE SODIUM PHOSPHATE 10 MG/ML IJ SOLN
INTRAMUSCULAR | Status: AC
  Filled 2016-11-29: qty 1

## 2016-11-29 MED ORDER — EPINEPHRINE PF 1 MG/10ML IJ SOSY
PREFILLED_SYRINGE | INTRAMUSCULAR | Status: AC
  Filled 2016-11-29: qty 10

## 2016-11-29 MED ORDER — CHLORHEXIDINE GLUCONATE 0.12% ORAL RINSE (MEDLINE KIT)
15.0000 mL | Freq: Two times a day (BID) | OROMUCOSAL | Status: DC
  Administered 2016-11-29: 15 mL via OROMUCOSAL

## 2016-11-29 MED ORDER — CHLORHEXIDINE GLUCONATE 0.12 % MT SOLN
15.0000 mL | Freq: Once | OROMUCOSAL | Status: AC
  Administered 2016-11-29: 15 mL via OROMUCOSAL

## 2016-11-29 MED ORDER — SODIUM CHLORIDE 0.9% FLUSH
10.0000 mL | Freq: Two times a day (BID) | INTRAVENOUS | Status: DC
  Administered 2016-11-29 – 2016-12-01 (×2): 40 mL

## 2016-11-29 MED ORDER — SODIUM CHLORIDE 0.9 % IJ SOLN
INTRAMUSCULAR | Status: AC
  Filled 2016-11-29: qty 40

## 2016-11-29 MED ORDER — ACETAMINOPHEN 160 MG/5ML PO SOLN
1000.0000 mg | Freq: Four times a day (QID) | ORAL | Status: DC
Start: 2016-11-30 — End: 2016-12-02
  Filled 2016-11-29: qty 40.6

## 2016-11-29 MED ORDER — MUPIROCIN 2 % EX OINT
1.0000 "application " | TOPICAL_OINTMENT | Freq: Two times a day (BID) | CUTANEOUS | Status: AC
  Administered 2016-11-29 – 2016-12-01 (×4): 1 via NASAL
  Filled 2016-11-29: qty 22

## 2016-11-29 MED ORDER — DEXTROSE 50 % IV SOLN
12.0000 mL | Freq: Once | INTRAVENOUS | Status: AC
  Administered 2016-11-29: 12 mL via INTRAVENOUS

## 2016-11-29 MED FILL — Lidocaine HCl IV Inj 20 MG/ML: INTRAVENOUS | Qty: 25 | Status: AC

## 2016-11-29 MED FILL — Sodium Bicarbonate IV Soln 8.4%: INTRAVENOUS | Qty: 50 | Status: AC

## 2016-11-29 MED FILL — Heparin Sodium (Porcine) Inj 1000 Unit/ML: INTRAMUSCULAR | Qty: 30 | Status: AC

## 2016-11-29 MED FILL — Mannitol IV Soln 20%: INTRAVENOUS | Qty: 1000 | Status: AC

## 2016-11-29 MED FILL — Magnesium Sulfate Inj 50%: INTRAMUSCULAR | Qty: 10 | Status: AC

## 2016-11-29 MED FILL — Potassium Chloride Inj 2 mEq/ML: INTRAVENOUS | Qty: 20 | Status: AC

## 2016-11-29 MED FILL — Sodium Chloride IV Soln 0.9%: INTRAVENOUS | Qty: 2000 | Status: AC

## 2016-11-29 MED FILL — Electrolyte-R (PH 7.4) Solution: INTRAVENOUS | Qty: 3000 | Status: AC

## 2016-11-29 SURGICAL SUPPLY — 142 items
ADAPTER CARDIO PERF ANTE/RETRO (ADAPTER) ×3 IMPLANT
ADH SKN CLS LQ APL DERMABOND (GAUZE/BANDAGES/DRESSINGS) ×2
ADPR PRFSN 84XANTGRD RTRGD (ADAPTER) ×2
BAG DECANTER FOR FLEXI CONT (MISCELLANEOUS) ×6 IMPLANT
BANDAGE ACE 4X5 VEL STRL LF (GAUZE/BANDAGES/DRESSINGS) ×3 IMPLANT
BANDAGE ACE 6X5 VEL STRL LF (GAUZE/BANDAGES/DRESSINGS) ×3 IMPLANT
BASKET HEART (ORDER IN 25'S) (MISCELLANEOUS) ×1
BASKET HEART (ORDER IN 25S) (MISCELLANEOUS) ×2 IMPLANT
BLADE CLIPPER SURG (BLADE) IMPLANT
BLADE STERNUM SYSTEM 6 (BLADE) ×3 IMPLANT
BLADE SURG 11 STRL SS (BLADE) ×3 IMPLANT
BNDG GAUZE ELAST 4 BULKY (GAUZE/BANDAGES/DRESSINGS) ×3 IMPLANT
CANISTER SUCT 3000ML PPV (MISCELLANEOUS) ×3 IMPLANT
CANNULA EZ GLIDE AORTIC 21FR (CANNULA) ×6 IMPLANT
CANNULA GUNDRY RCSP 15FR (MISCELLANEOUS) ×3 IMPLANT
CANNULA SOFTFLOW AORTIC 7M21FR (CANNULA) ×3 IMPLANT
CATH CPB KIT OWEN (MISCELLANEOUS) ×3 IMPLANT
CATH HEART VENT LEFT (CATHETERS) ×2 IMPLANT
CATH THORACIC 36FR (CATHETERS) ×3 IMPLANT
CATH THORACIC 36FR RT ANG (CATHETERS) ×3 IMPLANT
CLIP VESOCCLUDE MED 24/CT (CLIP) IMPLANT
CLIP VESOCCLUDE SM WIDE 24/CT (CLIP) IMPLANT
COVER SURGICAL LIGHT HANDLE (MISCELLANEOUS) ×3 IMPLANT
CRADLE DONUT ADULT HEAD (MISCELLANEOUS) ×3 IMPLANT
DERMABOND ADHESIVE PROPEN (GAUZE/BANDAGES/DRESSINGS) ×1
DERMABOND ADVANCED .7 DNX6 (GAUZE/BANDAGES/DRESSINGS) IMPLANT
DEVICE SUT CK QUICK LOAD INDV (Prosthesis & Implant Heart) ×1 IMPLANT
DEVICE SUT CK QUICK LOAD MINI (Prosthesis & Implant Heart) ×1 IMPLANT
DRAIN CHANNEL 32F RND 10.7 FF (WOUND CARE) ×6 IMPLANT
DRAPE BILATERAL SPLIT (DRAPES) IMPLANT
DRAPE CARDIOVASCULAR INCISE (DRAPES) ×3
DRAPE CV SPLIT W-CLR ANES SCRN (DRAPES) IMPLANT
DRAPE INCISE IOBAN 66X45 STRL (DRAPES) ×6 IMPLANT
DRAPE SLUSH/WARMER DISC (DRAPES) ×3 IMPLANT
DRAPE SRG 135X102X78XABS (DRAPES) ×2 IMPLANT
DRSG AQUACEL AG ADV 3.5X14 (GAUZE/BANDAGES/DRESSINGS) ×2 IMPLANT
DRSG COVADERM 4X14 (GAUZE/BANDAGES/DRESSINGS) ×3 IMPLANT
ELECT BLADE 4.0 EZ CLEAN MEGAD (MISCELLANEOUS) ×3
ELECT REM PT RETURN 9FT ADLT (ELECTROSURGICAL) ×6
ELECTRODE BLDE 4.0 EZ CLN MEGD (MISCELLANEOUS) ×2 IMPLANT
ELECTRODE REM PT RTRN 9FT ADLT (ELECTROSURGICAL) ×4 IMPLANT
FELT TEFLON 1X6 (MISCELLANEOUS) ×6 IMPLANT
GAUZE SPONGE 4X4 12PLY STRL (GAUZE/BANDAGES/DRESSINGS) ×4 IMPLANT
GAUZE SPONGE 4X4 12PLY STRL LF (GAUZE/BANDAGES/DRESSINGS) ×1 IMPLANT
GLOVE BIO SURGEON STRL SZ 6 (GLOVE) IMPLANT
GLOVE BIO SURGEON STRL SZ 6.5 (GLOVE) ×4 IMPLANT
GLOVE BIO SURGEON STRL SZ7 (GLOVE) IMPLANT
GLOVE BIO SURGEON STRL SZ7.5 (GLOVE) ×2 IMPLANT
GLOVE BIOGEL PI IND STRL 6 (GLOVE) ×4 IMPLANT
GLOVE BIOGEL PI INDICATOR 6 (GLOVE) ×4
GLOVE ORTHO TXT STRL SZ7.5 (GLOVE) ×8 IMPLANT
GOWN STRL REUS W/ TWL LRG LVL3 (GOWN DISPOSABLE) ×8 IMPLANT
GOWN STRL REUS W/TWL LRG LVL3 (GOWN DISPOSABLE) ×21
HEMOSTAT POWDER SURGIFOAM 1G (HEMOSTASIS) ×9 IMPLANT
INSERT FOGARTY XLG (MISCELLANEOUS) ×3 IMPLANT
IV NS IRRIG 3000ML ARTHROMATIC (IV SOLUTION) ×1 IMPLANT
KIT BASIN OR (CUSTOM PROCEDURE TRAY) ×3 IMPLANT
KIT CATH SUCT 8FR (CATHETERS) ×1 IMPLANT
KIT ROOM TURNOVER OR (KITS) ×3 IMPLANT
KIT SUCTION CATH 14FR (SUCTIONS) ×10 IMPLANT
KIT SUT CK MINI COMBO 4X17 (Prosthesis & Implant Heart) ×2 IMPLANT
KIT VASOVIEW HEMOPRO VH 3000 (KITS) ×3 IMPLANT
LEAD PACING MYOCARDI (MISCELLANEOUS) ×3 IMPLANT
LINE VENT (MISCELLANEOUS) ×2 IMPLANT
MARKER GRAFT CORONARY BYPASS (MISCELLANEOUS) ×9 IMPLANT
NDL SUT 1 .5 CRC FRENCH EYE (NEEDLE) IMPLANT
NEEDLE FRENCH EYE (NEEDLE) ×3
NS IRRIG 1000ML POUR BTL (IV SOLUTION) ×15 IMPLANT
PACK OPEN HEART (CUSTOM PROCEDURE TRAY) ×3 IMPLANT
PAD ARMBOARD 7.5X6 YLW CONV (MISCELLANEOUS) ×6 IMPLANT
PAD ELECT DEFIB RADIOL ZOLL (MISCELLANEOUS) ×3 IMPLANT
PENCIL BUTTON HOLSTER BLD 10FT (ELECTRODE) ×3 IMPLANT
PUNCH AORTIC ROTATE 4.0MM (MISCELLANEOUS) IMPLANT
PUNCH AORTIC ROTATE 4.5MM 8IN (MISCELLANEOUS) IMPLANT
PUNCH AORTIC ROTATE 5MM 8IN (MISCELLANEOUS) ×1 IMPLANT
SET CARDIOPLEGIA MPS 5001102 (MISCELLANEOUS) ×1 IMPLANT
SET IRRIG TUBING LAPAROSCOPIC (IRRIGATION / IRRIGATOR) ×3 IMPLANT
SOLUTION ANTI FOG 6CC (MISCELLANEOUS) ×1 IMPLANT
SPONGE LAP 18X18 X RAY DECT (DISPOSABLE) IMPLANT
SPONGE LAP 4X18 X RAY DECT (DISPOSABLE) IMPLANT
SUT BONE WAX W31G (SUTURE) ×3 IMPLANT
SUT ETHIBON 2 0 V 52N 30 (SUTURE) ×8 IMPLANT
SUT ETHIBON EXCEL 2-0 V-5 (SUTURE) IMPLANT
SUT ETHIBOND 2 0 SH (SUTURE) ×15
SUT ETHIBOND 2 0 SH 36X2 (SUTURE) ×5 IMPLANT
SUT ETHIBOND 2 0 V4 (SUTURE) IMPLANT
SUT ETHIBOND 2 0V4 GREEN (SUTURE) IMPLANT
SUT ETHIBOND 4 0 RB 1 (SUTURE) IMPLANT
SUT ETHIBOND V-5 VALVE (SUTURE) IMPLANT
SUT ETHIBOND X763 2 0 SH 1 (SUTURE) ×9 IMPLANT
SUT MNCRL AB 3-0 PS2 18 (SUTURE) ×6 IMPLANT
SUT MNCRL AB 4-0 PS2 18 (SUTURE) IMPLANT
SUT PDS AB 1 CTX 36 (SUTURE) ×6 IMPLANT
SUT PROLENE 2 0 SH DA (SUTURE) IMPLANT
SUT PROLENE 3 0 SH DA (SUTURE) ×5 IMPLANT
SUT PROLENE 3 0 SH1 36 (SUTURE) IMPLANT
SUT PROLENE 4 0 RB 1 (SUTURE) ×21
SUT PROLENE 4 0 SH DA (SUTURE) ×3 IMPLANT
SUT PROLENE 4-0 RB1 .5 CRCL 36 (SUTURE) ×4 IMPLANT
SUT PROLENE 5 0 C 1 36 (SUTURE) ×2 IMPLANT
SUT PROLENE 6 0 C 1 30 (SUTURE) ×7 IMPLANT
SUT PROLENE 7.0 RB 3 (SUTURE) ×12 IMPLANT
SUT PROLENE 8 0 BV175 6 (SUTURE) ×4 IMPLANT
SUT PROLENE BLUE 7 0 (SUTURE) ×3 IMPLANT
SUT PROLENE POLY MONO (SUTURE) IMPLANT
SUT SILK  1 MH (SUTURE) ×5
SUT SILK 1 MH (SUTURE) ×2 IMPLANT
SUT SILK 1 TIES 10X30 (SUTURE) ×1 IMPLANT
SUT SILK 2 0 SH CR/8 (SUTURE) ×2 IMPLANT
SUT SILK 2 0 TIES 10X30 (SUTURE) ×1 IMPLANT
SUT SILK 2 0 TIES 17X18 (SUTURE) ×3
SUT SILK 2-0 18XBRD TIE BLK (SUTURE) IMPLANT
SUT SILK 3 0 SH CR/8 (SUTURE) ×2 IMPLANT
SUT SILK 4 0 TIE 10X30 (SUTURE) ×4 IMPLANT
SUT STEEL 6MS V (SUTURE) ×2 IMPLANT
SUT STEEL STERNAL CCS#1 18IN (SUTURE) IMPLANT
SUT STEEL SZ 6 DBL 3X14 BALL (SUTURE) ×2 IMPLANT
SUT TEM PAC WIRE 2 0 SH (SUTURE) ×4 IMPLANT
SUT VIC AB 1 CTX 36 (SUTURE)
SUT VIC AB 1 CTX36XBRD ANBCTR (SUTURE) IMPLANT
SUT VIC AB 2-0 CT1 27 (SUTURE) ×3
SUT VIC AB 2-0 CT1 TAPERPNT 27 (SUTURE) IMPLANT
SUT VIC AB 2-0 CTX 27 (SUTURE) ×2 IMPLANT
SUT VIC AB 3-0 SH 27 (SUTURE)
SUT VIC AB 3-0 SH 27X BRD (SUTURE) IMPLANT
SUT VIC AB 3-0 X1 27 (SUTURE) ×2 IMPLANT
SUT VICRYL 4-0 PS2 18IN ABS (SUTURE) IMPLANT
SUTURE E-PAK OPEN HEART (SUTURE) ×3 IMPLANT
SYSTEM SAHARA CHEST DRAIN ATS (WOUND CARE) ×3 IMPLANT
TAPE CLOTH SURG 4X10 WHT LF (GAUZE/BANDAGES/DRESSINGS) ×1 IMPLANT
TAPE PAPER 2X10 WHT MICROPORE (GAUZE/BANDAGES/DRESSINGS) ×1 IMPLANT
TOWEL GREEN STERILE (TOWEL DISPOSABLE) ×12 IMPLANT
TOWEL GREEN STERILE FF (TOWEL DISPOSABLE) ×6 IMPLANT
TOWEL OR 17X24 6PK STRL BLUE (TOWEL DISPOSABLE) ×6 IMPLANT
TOWEL OR 17X26 10 PK STRL BLUE (TOWEL DISPOSABLE) ×6 IMPLANT
TRAY FOLEY SILVER 16FR TEMP (SET/KITS/TRAYS/PACK) ×3 IMPLANT
TUBING INSUFFLATION (TUBING) ×3 IMPLANT
UNDERPAD 30X30 (UNDERPADS AND DIAPERS) ×3 IMPLANT
VALVE AORTIC TOP HAT (Prosthesis & Implant Heart) ×3 IMPLANT
VALVE AORTIC TOP HAT 23 (Prosthesis & Implant Heart) IMPLANT
VENT LEFT HEART 12002 (CATHETERS) ×3
WATER STERILE IRR 1000ML POUR (IV SOLUTION) ×6 IMPLANT

## 2016-11-29 NOTE — Anesthesia Procedure Notes (Signed)
Procedure Name: Intubation Date/Time: 11/29/2016 8:09 AM Performed by: Clearnce Sorrel Pre-anesthesia Checklist: Patient identified, Emergency Drugs available, Suction available, Patient being monitored and Timeout performed Patient Re-evaluated:Patient Re-evaluated prior to induction Oxygen Delivery Method: Circle system utilized Preoxygenation: Pre-oxygenation with 100% oxygen Induction Type: IV induction Ventilation: Mask ventilation without difficulty and Oral airway inserted - appropriate to patient size Laryngoscope Size: Mac and 3 Grade View: Grade I Tube size: 8.0 mm Number of attempts: 1 Airway Equipment and Method: Stylet Placement Confirmation: ETT inserted through vocal cords under direct vision,  positive ETCO2 and breath sounds checked- equal and bilateral Secured at: 22 cm Tube secured with: Tape Dental Injury: Teeth and Oropharynx as per pre-operative assessment

## 2016-11-29 NOTE — Interval H&P Note (Signed)
History and Physical Interval Note:  11/29/2016 7:15 AM  Michael Delgado  has presented today for surgery, with the diagnosis of aortic stenosis, coronary artery disease  The various methods of treatment have been discussed with the patient and family. After consideration of risks, benefits and other options for treatment, the patient has consented to  Procedure(s): AORTIC VALVE REPLACEMENT (AVR) (N/A) CORONARY ARTERY BYPASS GRAFTING (CABG) (N/A) TRANSESOPHAGEAL ECHOCARDIOGRAM (TEE) (N/A) as a surgical intervention .  The patient's history has been reviewed, patient examined, no change in status, stable for surgery.  I have reviewed the patient's chart and labs.  Questions were answered to the patient's satisfaction.     Rexene Alberts

## 2016-11-29 NOTE — Plan of Care (Signed)
Problem: Cardiac: Goal: Ability to maintain an adequate cardiac output will improve Outcome: Progressing Improved with interventions   Problem: Respiratory: Goal: Ability to tolerate decreased levels of ventilator support will improve Outcome: Completed/Met Date Met: 11/29/16 Pt extubated 5 hours postop

## 2016-11-29 NOTE — OR Nursing (Signed)
12:35 - 45 minute call to SICU charge nurse 13:10 - 20 minute call to SICU charge nurse 

## 2016-11-29 NOTE — Progress Notes (Signed)
  Echocardiogram Echocardiogram Transesophageal has been performed.  Johny Chess 11/29/2016, 2:50 PM

## 2016-11-29 NOTE — Anesthesia Procedure Notes (Signed)
Central Venous Catheter Insertion Performed by: Roderic Palau, anesthesiologist Start/End8/17/2018 6:50 AM, 11/29/2016 7:05 AM Patient location: Pre-op. Preanesthetic checklist: patient identified, IV checked, site marked, risks and benefits discussed, surgical consent, monitors and equipment checked, pre-op evaluation, timeout performed and anesthesia consent Position: Trendelenburg Lidocaine 1% used for infiltration and patient sedated Hand hygiene performed , maximum sterile barriers used  and Seldinger technique used Catheter size: 9 Fr Total catheter length 10. Central line and PA cath was placed.MAC introducer Swan type:thermodilution PA Cath depth:50 Procedure performed using ultrasound guided technique. Ultrasound Notes:anatomy identified, needle tip was noted to be adjacent to the nerve/plexus identified, no ultrasound evidence of intravascular and/or intraneural injection and image(s) printed for medical record Attempts: 1 Following insertion, line sutured and dressing applied. Post procedure assessment: blood return through all ports, free fluid flow and no air  Patient tolerated the procedure well with no immediate complications.

## 2016-11-29 NOTE — Anesthesia Procedure Notes (Signed)
Arterial Line Insertion Start/End8/17/2018 7:00 AM, 11/29/2016 7:00 AM Performed by: CRNA  Patient location: Pre-op. Preanesthetic checklist: patient identified, IV checked, site marked, risks and benefits discussed, surgical consent, monitors and equipment checked, pre-op evaluation, timeout performed and anesthesia consent Lidocaine 1% used for infiltration radial was placed Catheter size: 20 Fr Hand hygiene performed  and maximum sterile barriers used   Attempts: 1 Procedure performed using ultrasound guided technique. Following insertion, dressing applied. Post procedure assessment: normal and unchanged

## 2016-11-29 NOTE — Progress Notes (Addendum)
CRITICAL VALUE ALERT  Critical Value:  BS 70  Date & Time Notied:  n/a  Provider Notified: n/a  Orders Received/Actions taken: orders per Glucostbilizer, 12 ml D50 given  Repeat BS 158

## 2016-11-29 NOTE — Op Note (Signed)
CARDIOTHORACIC SURGERY OPERATIVE NOTE  Date of Procedure:  11/29/2016  Preoperative Diagnosis:   Severe Aortic Stenosis  Severe Single-vessel Coronary Artery Disease  Postoperative Diagnosis: Same  Procedure:    Aortic Valve Replacement  Sorin Carbomedics top hat bileaflet mechanical valve (size 36mm, cat # [id number], serial # [id number])   Coronary Artery Bypass Grafting x 1  Reversed Greater Saphenous Vein Graft to Distal Right Coronary Artery  Endoscopic Vein Harvest from Right Thigh   Surgeon: Valentina Gu. Roxy Manns, MD  Assistant: John Giovanni, PA-C  Anesthesia: Arabella Merles, MD  Operative Findings:  Severe aortic stenosis  Normal left ventricular systolic function  Fair quality saphenous vein conduit  Good quality target vessels for grafting  Severe calcification of aortic root, aortomitral curtain and distal ascending thoracic aorta      BRIEF CLINICAL NOTE AND INDICATIONS FOR SURGERY  Patient is a 56 year old male with history of aortic stenosis, hypertension, hyperlipidemia, lupus anticoagulant positive on long-term warfarin anticoagulation, Hodgkin's lymphoma status post chemotherapy and mantle radiation therapy to the chest, hypothyroidism, GE reflux disease, and remote history of tobacco abuse who has been referred for surgical consultation to discuss treatment options for management of severe symptomatic aortic stenosis and coronary artery disease. The patient states that he was noted to have a heart murmur on physical exam many years ago by his primary care physician. He was referred for cardiology consultation and evaluated by Dr. Radford Pax in the past. Echocardiogram performed in 2008 revealed mild aortic stenosis. Despite encouragement from the patient's primary care physician, patient did not return to see Dr. Radford Pax for follow up until recently. Over the past year the patient has developed progressive symptoms of exertional shortness of breath and chest  discomfort. In May the patient underwent uncomplicated partial left nephrectomy for early-stage renal cell carcinoma. Since then the patient has experienced further increase in symptoms of exertional shortness of breath. He was seen in follow-up by Dr. Radford Pax and echocardiogram performed 10/07/2016 revealed severe aortic stenosis with normal left ventricular systolic function. Peak velocity across the aortic valve measured 4.3 m/s corresponding to mean transvalvular gradient estimated 41 mmHg. There was moderate aortic insufficiency. Ejection fraction was estimated 60-65%. Diagnostic cardiac catheterization was performed 10/23/2016 and notable for the presence of severe aortic stenosis and single-vessel coronary artery disease. Mean transvalvular gradient across the aortic valve was measured 44 mmHg corresponding to aortic valve area calculated 0.98 cm. Pulmonary artery pressures were mildly elevated. Coronary angiography revealed tubular 70% ostial stenosis of the right coronary artery, 80% proximal stenosis of an acute marginal branch, and 100% occlusion of the terminal torsion the right coronary artery beyond the posterior descending coronary artery and prior to a small posterolateral branch. Therewas no significant obstructive disease in the left coronary circulation. The patient was referred for elective surgical consultation.  The patient has been seen in consultation and counseled at length regarding the indications, risks and potential benefits of surgery.  All questions have been answered, and the patient provides full informed consent for the operation as described.          DETAILS OF THE OPERATIVE PROCEDURE  Preparation:  The patient is brought to the operating room on the above mentioned date and central monitoring was established by the anesthesia team including placement of Swan-Ganz catheter and radial arterial line. The patient is placed in the supine position on the operating  table.  Intravenous antibiotics are administered. General endotracheal anesthesia is induced uneventfully. A Foley catheter is placed.  Baseline  transesophageal echocardiogram was performed.  Findings were notable for severe aortic stenosis. Aortic valve was trileaflet, heavily calcified, and severely stenotic. Mean gradient across the aortic valve was estimated greater than 60 mmHg. There was severe calcification in the aortic root. Left ventricular systolic function appeared normal. There was mild to moderate left ventricular hypertrophy.  The patient's chest, abdomen, both groins, and both lower extremities are prepared and draped in a sterile manner. A time out procedure is performed.   Surgical Approach and Conduit Harvest:  The greater saphenous vein is obtained from the patient's right thigh using endoscopic vein harvest technique. The saphenous vein is notably very large diameter and fair quality conduit.  After removal of the saphenous vein, the small surgical incisions in the lower extremity are closed with absorbable suture. A median sternotomy incision was performed    Extracorporeal Cardiopulmonary Bypass and Myocardial Protection:  The pericardium is opened. The ascending aorta is normal sized.  There is palpable calcification in the distal ascending aorta and transverse aortic arch. The ascending aorta and the right atrium are cannulated for cardioplegia bypass.  Adequate heparinization is verified.    A retrograde cardioplegia cannula is placed through the right atrium into the coronary sinus.  The operative field was continuously flooded with carbon dioxide gas.  The entire pre-bypass portion of the operation was notable for stable hemodynamics.  Cardiopulmonary bypass was begun and a left ventricular vent placed through the right superior pulmonary vein.  The surface of the heart inspected. Distal target vessels are selected for coronary artery bypass grafting. A cardioplegia  cannula is placed in the ascending aorta.  A temperature probe was placed in the interventricular septum.  The patient is cooled to 28C systemic temperature.  The aortic cross clamp is applied and cardioplegia is delivered initially in an antegrade fashion through the aortic root using modified [place] cold blood cardioplegia ([place] blood cardioplegia protocol).   The initial cardioplegic arrest is rapid with early diastolic arrest.  Repeat doses of cardioplegia are administered at 90 minutes and every 30 minutes thereafter through the coronary sinus catheter in order to maintain completely flat electrocardiogram.  Myocardial protection was felt to be excellent.   Coronary Artery Bypass Grafting:  The  distal right coronary artery was grafted using a reversed saphenous vein graft in an end-to-side fashion.  At the site of distal anastomosis the target vessel was good quality and measured approximately 2.2 mm in diameter.   Aortic Valve Replacement:  An oblique transverse aortotomy incision was performed.  The aortic valve was inspected and notable for severe aortic stenosis.  The aortic valve leaflets were excised sharply and the aortic annulus decalcified.  Decalcification was notably tedious due to extensive annular calcification with a large sheet of calcification extending beneath the left sinus of [place] into the aortomitral curtain and the anterior leaflet of the aortic valve.  The aortic annulus was sized to accept a 23 mm prosthesis.  The aortic root and left ventricle were irrigated with copious cold saline solution.  Aortic valve replacement was performed using interrupted horizontal mattress 2-0 Ethibond pledgeted sutures with pledgets in the subannular position.  A Sorin Carbomedics top hat bileaflet mechanical prosthetic valve (size 23 mm, cat # [id number], serial # [id number]) was implanted uneventfully. The valve seated appropriately with adequate space beneath the left main and  right coronary artery.  The aortotomy was closed using a 2-layer closure of running 4-0 Prolene suture.   Procedure Completion:  The proximal vein graft  anastomosis was placed directly to the ascending aorta prior to removal of the aortic cross clamp. One final dose of warm retrograde "reanimation dose" cardioplegia was administered through the coronary sinus catheter while all air was evacuated through the aortic root.  The aortic cross clamp was removed after a total cross clamp time of 111 minutes.  All proximal and distal coronary anastomoses were inspected for hemostasis and appropriate graft orientation. Epicardial pacing wires are fixed to the right ventricular outflow tract and to the right atrial appendage. The patient is rewarmed to 37C temperature. The aortic and left ventricular vents were removed.  The patient is weaned and disconnected from cardiopulmonary bypass.  The patient's rhythm at separation from bypass was AV paced.  The patient was weaned from cardiopulmonary bypass without any inotropic support. Total cardiopulmonary bypass time for the operation was 140 minutes.  Followup transesophageal echocardiogram performed after separation from bypass revealed a well-seated bileaflet mechanical valve in the aortic position that was functioning normally.  There was no perivalvular leak.  There were otherwise no changes from the preoperative exam.  The aortic and venous cannula were removed uneventfully. Protamine was administered to reverse the anticoagulation. The mediastinum was inspected for hemostasis and irrigated with saline solution. The mediastinum was drained using 2 chest tubes placed through separate stab incisions inferiorly.  The soft tissues anterior to the aorta were reapproximated loosely. The sternum is closed with double strength sternal wire. The soft tissues anterior to the sternum were closed in multiple layers and the skin is closed with a running subcuticular skin  closure.  The post-bypass portion of the operation was notable for stable rhythm and hemodynamics.  No blood products were administered during the operation.   Disposition:  The patient tolerated the procedure well and is transported to the surgical intensive care in stable condition. There are no intraoperative complications. All sponge instrument and needle counts are verified correct at completion of the operation.   Valentina Gu. Roxy Manns MD 11/29/2016 1:19 PM

## 2016-11-29 NOTE — Progress Notes (Signed)
Patient ID: Michael Delgado, male   DOB: Jul 11, 1960, 56 y.o.   MRN: [id number] EVENING ROUNDS NOTE :     [address]       [place] [address]             [phone]                 Day of Surgery Procedure(s) (LRB): AORTIC VALVE REPLACEMENT (AVR) using 23MM Carbonmedics Valve (N/A) CORONARY ARTERY BYPASS GRAFTING (CABG) x one, using right leg greater saphenous vein harvested endoscopically (N/A) TRANSESOPHAGEAL ECHOCARDIOGRAM (TEE) (N/A)  Total Length of Stay:  LOS: 0 days  BP 114/74   Pulse 90   Temp 98.4 F (36.9 C)   Resp 20   Ht 5' 9.5" (1.765 m)   Wt 267 lb (121.1 kg)   SpO2 96%   BMI 38.86 kg/m   .Intake/Output      08/17 0701 - 08/18 0700   I.V. (mL/kg) 4249.3 (35.1)   Blood 441   NG/GT 30   IV Piggyback 1620   Total Intake(mL/kg) 6340.3 (52.4)   Urine (mL/kg/hr) 1635 (1.1)   Blood 1700   Chest Tube 150   Total Output 3485   Net +2855.3         . sodium chloride 20 mL/hr at 11/29/16 1900  . sodium chloride 100 mL/hr at 11/29/16 1900  . [START ON 11/30/2016] sodium chloride    . sodium chloride 1 mL/hr at 11/29/16 1900  . albumin human    . [START ON 11/30/2016] cefUROXime (ZINACEF)  IV    . dexmedetomidine (PRECEDEX) IV infusion 0.1 mcg/kg/hr (11/29/16 1900)  . famotidine (PEPCID) IV Stopped (11/29/16 1430)  . insulin (NOVOLIN-R) infusion 2.8 Units/hr (11/29/16 1900)  . lactated ringers 10 mL/hr at 11/29/16 1900  . lactated ringers    . nitroGLYCERIN Stopped (11/29/16 1500)  . phenylephrine (NEO-SYNEPHRINE) Adult infusion Stopped (11/29/16 1810)  . vancomycin       Lab Results  Component Value Date   WBC 11.3 (H) 11/29/2016   HGB 10.2 (L) 11/29/2016   HCT 30.0 (L) 11/29/2016   PLT 121 (L) 11/29/2016   GLUCOSE 119 (H) 11/29/2016   ALT 36 11/25/2016   AST 26 11/25/2016   NA 139 11/29/2016   K 4.8 11/29/2016   CL 104 11/29/2016   CREATININE 0.70 11/29/2016   BUN 22 (H) 11/29/2016   CO2 24 11/25/2016   INR 1.35 11/29/2016   HGBA1C 6.3 (H) 11/25/2016   Extubated , neuro inatct, not bleeding , ci now 2.0 with volume   [name] MD  Beeper [phone] Office [phone] 11/29/2016 7:09 PM

## 2016-11-29 NOTE — Procedures (Signed)
Extubation Procedure Note  Patient Details:   Name: [name] DOB: [birth year] MRN: [id number]   Airway Documentation:     Evaluation  O2 sats: stable throughout Complications: No apparent complications Patient did tolerate procedure well. Bilateral Breath Sounds: Clear, Diminished   Yes   Pt extubated to 4L N/C.  No stridor noted.  NIF -32, VC 1184 mL.  RN at bedside.  Donnetta Hail 11/29/2016, 6:44 PM

## 2016-11-29 NOTE — H&P (Addendum)
[address]       [place] [address]             [phone]          CARDIOTHORACIC SURGERY HISTORY AND PHYSICAL EXAM  Referring Provider is Sueanne Margarita, MD PCP is [name], MD      Chief Complaint  Patient presents with  . Aortic Stenosis    Surgical eval, Cardiac Cath 10/23/2016, ECHO 10/07/16,   . Coronary Artery Disease    HPI:  Patient is a 56 year old male with history of aortic stenosis, hypertension, hyperlipidemia, lupus anticoagulant positive on long-term warfarin anticoagulation, Hodgkin's lymphoma status post chemotherapy and mantle radiation therapy to the chest, hypothyroidism, GE reflux disease, and remote history of tobacco abuse who has been referred for surgical consultation to discuss treatment options for management of severe symptomatic aortic stenosis and coronary artery disease. The patient states that he was noted to have a heart murmur on physical exam many years ago by his primary care physician. He was referred for cardiology consultation and evaluated by Dr. Radford Pax in the past. Echocardiogram performed in 2008 revealed mild aortic stenosis. Despite encouragement from the patient's primary care physician, patient did not return to see Dr. Radford Pax for follow up until recently. Over the past year the patient has developed progressive symptoms of exertional shortness of breath and chest discomfort. In May the patient underwent uncomplicated partial left nephrectomy for early-stage renal cell carcinoma. Since then the patient has experienced further increase in symptoms of exertional shortness of breath. He was seen in follow-up by Dr. Radford Pax and echocardiogram performed 10/07/2016 revealed severe aortic stenosis with normal left ventricular systolic function. Peak velocity across the aortic valve measured 4.3 m/s corresponding to mean transvalvular gradient estimated 41 mmHg.  There was moderate aortic insufficiency. Ejection  fraction was estimated 60-65%.  Diagnostic cardiac catheterization was performed 10/23/2016 and notable for the presence of severe aortic stenosis and single-vessel coronary artery disease. Mean transvalvular gradient across the aortic valve was measured 44 mmHg corresponding to aortic valve area calculated 0.98 cm. Pulmonary artery pressures were mildly elevated. Coronary angiography revealed tubular 70% ostial stenosis of the right coronary artery, 80% proximal stenosis of an acute marginal branch, and 100% occlusion of the terminal torsion the right coronary artery beyond the posterior descending coronary artery and prior to a small posterolateral branch.  There was no significant obstructive disease in the left coronary circulation.  The patient was referred for elective surgical consultation.  The patient is married and lives with his wife in [place].  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 His job requires long hours but does not require strenuous physical exertion.  The patient has remained physically active and functionally independent all of his life. He admits that over the past year he has slowed down considerably with progressive symptoms of exertional shortness of breath and fatigue. He tires easily. He now gets short of breath with moderate activity, such as walking up a flight of stairs. This is limiting his physical activities considerably. He occasionally gets substernal chest tightness in association with shortness of breath. In the last few weeks he has had some difficulty lying flat in bed and occasional episodes of PND. He has a dry nonproductive cough. He has not had any prolonged episodes of chest pain or shortness of breath. He has not had any dizzy spells or syncope.  The patient has been chronically  anticoagulated using warfarin for more than 20 years. He originally presented with ischemic small bowel secondary to embolization requiring  laparotomy for bowel resection. He was diagnosed with lupus anticoagulant hypercoagulable state. He has not had any problems or complications with long-term warfarin anticoagulation.  Patient returns to the office today for follow-up of severe symptomatic aortic stenosis and coronary artery disease stented plans to proceed with aortic valve replacement and coronary artery bypass grafting later this week. He was originally seen in consultation on 10/30/2016. Since then he traveled to [place] where his son got married. He returns to our office today and reports no new problems or complaints. He continues to experience exertional shortness of breath without chest discomfort. He states that symptoms have not progressed over the last few weeks. The remainder of his review of systems remains unchanged from previously.   Past Medical History:  Diagnosis Date  . Anginal pain ([place])    occ none recent  . Blood dyscrasia    lupus anti coagulant  . Cancer ([facility]) 08/2016   renal   . CHF (congestive heart failure) ([place])   . Chronic anticoagulation    INR goal 2.5-3.5  . COPD (chronic obstructive pulmonary disease) ([place])   . Coronary artery disease involving native coronary artery of native heart without angina pectoris   . Erectile dysfunction   . GERD (gastroesophageal reflux disease)   . Heart murmur   . History of bowel infarction   . Hx of Hodgkin's lymphoma   . Hx of lupus anticoagulant disorder    history of blood work showing lupus  . Hyperlipidemia   . Hypertension   . Hypothyroidism   . Pneumonia    hx  . Severe aortic stenosis   . Thyroid disease   . Tobacco dependency     Past Surgical History:  Procedure Laterality Date  . ABDOMINAL SURGERY  1999  . BOWEL INFARCTION  1997   HAD SURGERY AND FOUND TO HAVE LUPUS ANTICOAGULANT  . HODGKIN  LYMPHOMA WITH RADIATION AND CHEMOTHERAPY  1984  . RIGHT/LEFT HEART CATH AND CORONARY ANGIOGRAPHY N/A 10/23/2016   Procedure: Right/Left Heart  Cath and Coronary Angiography;  Surgeon: Troy Sine, MD;  Location: [facility];  Service: Cardiovascular;  Laterality: N/A;  . ROBOTIC ASSITED PARTIAL NEPHRECTOMY Right 09/11/2016   Procedure: XI ROBOTIC ASSISTED RETROPERITONEAL PARTIAL NEPHRECTOMY;  Surgeon: Alexis Frock, MD;  Location: [facility];  Service: Urology;  Laterality: Right;  . THORACIC AORTOGRAM N/A 10/23/2016   Procedure: Thoracic Aortogram;  Surgeon: Troy Sine, MD;  Location: [facility];  Service: Cardiovascular;  Laterality: N/A;    Family History  Problem Relation Age of Onset  . Arthritis Mother   . Hypertension Mother   . High Cholesterol Mother   . Cancer - Ovarian Mother   . Diabetes Father   . Hypertension Father   . Arthritis Father   . High Cholesterol Father   . Arthritis Sister   . Heart disease Brother   . Hypertension Brother   . Healthy Brother   . Asthma Sister   . Heart disease Sister     Social History Social History  Substance Use Topics  . Smoking status: Former Smoker    Quit date: 06/12/2014  . Smokeless tobacco: Never Used  . Alcohol use No    Prior to Admission medications   Medication Sig Start Date End Date Taking? Authorizing Provider  benzonatate (TESSALON) 100 MG capsule Take 100 mg by mouth daily  as needed for cough.   Yes [provider]  budesonide-formoterol (SYMBICORT) 80-4.5 MCG/ACT inhaler Inhale 1 puff into the lungs at bedtime as needed (shortness of breath).   Yes [provider]  diphenhydramine-acetaminophen (TYLENOL PM) 25-500 MG TABS tablet Take 1 tablet by mouth at bedtime.   Yes [provider]  levothyroxine (SYNTHROID, LEVOTHROID) 50 MCG tablet Take 50 mcg by mouth daily before breakfast.  08/04/14  Yes [provider]  loratadine (CLARITIN) 10 MG tablet Take 10 mg by mouth daily as needed for allergies.   Yes [provider]  metoprolol tartrate (LOPRESSOR) 25 MG tablet Take 0.5 tablets (12.5 mg  total) by mouth 2 (two) times daily. 10/23/16  Yes Cheryln Manly, NP  Omega-3 Fatty Acids (FISH OIL PO) Take 3 capsules by mouth at bedtime. 1200/360 MG   Yes [provider]  Potassium 99 MG TABS Take 99 mg by mouth every evening.    Yes [provider]  quinapril (ACCUPRIL) 20 MG tablet Take 20 mg by mouth 2 (two) times daily.   Yes [provider]  simvastatin (ZOCOR) 40 MG tablet Take 40 mg by mouth every evening.   Yes [provider]  warfarin (COUMADIN) 5 MG tablet Take 5 mg by mouth every evening.    Yes [provider]    No Known Allergies  Review of Systems:              General:                      normal appetite, decreased energy, no weight gain, no weight loss, no fever             Cardiac:                       + chest pain with exertion, no chest pain at rest, + SOB with exertion, no resting SOB, + PND, + orthopnea, no palpitations, no arrhythmia, no atrial fibrillation, + slight LE edema, no dizzy spells, no syncope             Respiratory:                 + shortness of breath, no home oxygen, no productive cough, + dry cough, no bronchitis, no wheezing, no hemoptysis, no asthma, no pain with inspiration or cough, no sleep apnea, no CPAP at night             GI:                               no difficulty swallowing, + reflux, + frequent heartburn, no hiatal hernia, no abdominal pain, no constipation, no diarrhea, no hematochezia, no hematemesis, no melena             GU:                              no dysuria,  no frequency, no urinary tract infection, no hematuria, no enlarged prostate, no kidney stones, + kidney disease - recent partial nephrectomy             Vascular:                     no pain suggestive of claudication, no pain in feet, + leg cramps, + varicose  veins, no DVT, no non-healing foot ulcer             Neuro:                         no stroke, no TIA's, no seizures, no headaches, no temporary blindness one  eye,  no slurred speech, no peripheral neuropathy, no chronic pain, no instability of gait, no memory/cognitive dysfunction             Musculoskeletal:         no arthritis, no joint swelling, no myalgias, no difficulty walking, normal mobility              Skin:                            no rash, no itching, no skin infections, no pressure sores or ulcerations             Psych:                         no anxiety, no depression, no nervousness, no unusual recent stress             Eyes:                           no blurry vision, no floaters, no recent vision changes, + wears glasses for reading only             ENT:                            no hearing loss, edentulous w/ full dentures             Hematologic:               no easy bruising, + abnormal bleeding, + clotting disorder, no frequent epistaxis             Endocrine:                   no diabetes, does not check CBG's at home                           Physical Exam:              BP 139/79 (BP Location: Left Arm, Patient Position: Sitting, Cuff Size: Large)   Temp (!) 89 F (31.7 C)   Resp 16   Ht 5\' 9"  (1.753 m)   Wt 263 lb (119.3 kg)   SpO2 95% Comment: RA  BMI 38.84 kg/m              General:                      Moderately obese,  well-appearing             HEENT:                       Unremarkable              Neck:                           no JVD, no bruits, no adenopathy  Chest:                          clear to auscultation, symmetrical breath sounds, no wheezes, no rhonchi              CV:                              RRR, grade IV/VI harsh crescendo/decrescendo systolic murmur              Abdomen:                    soft, non-tender, no masses              Extremities:                 warm, well-perfused, pulses diminished, no LE edema             Rectal/GU                   Deferred             Neuro:                         Grossly non-focal and symmetrical throughout             Skin:                             Clean and dry, no rashes, no breakdown   Diagnostic Tests:  Transthoracic Echocardiography  Patient: Michael Delgado, Winegar MR #: [id number] Study Date: 10/07/2016 Gender: M Age: 40 Height: 175.3 cm Weight: 119.8 kg BSA: 2.47 m^2 Pt. Status: Room:  ATTENDING Fransico Him, MD ORDERING Fransico Him, MD REFERRING Fransico Him, MD SONOGRAPHER Oletta Lamas, Will PERFORMING [facility] Outpatient  cc:  ------------------------------------------------------------------- LV EF: 60% - 65%  ------------------------------------------------------------------- Indications: (I35.0).  ------------------------------------------------------------------- History: PMH: Acquired from the patient and from the patient&'s chart. Aortic stenosis. Aortic valve disease. Risk factors: Current tobacco use. Hypertension. Obese. Dyslipidemia.  ------------------------------------------------------------------- Study Conclusions  - Left ventricle: The cavity size was normal. Wall thickness was increased in a pattern of mild LVH. Systolic function was normal. The estimated ejection fraction was in the range of 60% to 65%. - Aortic valve: AV is thickened, calcified with restricted motion. Peak and mean gradients through the valve are 73 and 42 mm Hg respectively consistent with severe AS. There was moderate regurgitation. - Left atrium: The atrium was mildly dilated. - Pulmonary arteries: PA peak pressure: 36 mm Hg (S).  ------------------------------------------------------------------- Study data: No prior study was available for comparison. Study status: Routine. Procedure: The patient reported no pain pre or post test. Transthoracic echocardiography for left ventricular function evaluation and for assessment of valvular function. Image quality was adequate. Study completion: There were  no complications. Transthoracic echocardiography. M-mode, complete 2D, spectral Doppler, and color Doppler. Birthdate: Patient birthdate: 1961/01/03. Age: Patient is 56 yr old. Sex: Gender: male. BMI: 39 kg/m^2. Blood pressure: 126/82 Patient status: Outpatient. Study date: Study date: 10/07/2016. Study time: 07:45 AM. Location: [facility]  -------------------------------------------------------------------  ------------------------------------------------------------------- Left ventricle: The cavity size was normal. Wall thickness was increased in a pattern of mild LVH. Systolic function was normal. The estimated ejection fraction was in the range of 60% to 65%.  ------------------------------------------------------------------- Aortic valve: AV is thickened,  calcified with restricted motion. Peak and mean gradients through the valve are 73 and 42 mm Hg respectively consistent with severe AS. Doppler: There was moderate regurgitation. VTI ratio of LVOT to aortic valve: 0.23. Valve area (VTI): 0.73 cm^2. Indexed valve area (VTI): 0.3 cm^2/m^2. Peak velocity ratio of LVOT to aortic valve: 0.23. Valve area (Vmax): 0.74 cm^2. Indexed valve area (Vmax): 0.3 cm^2/m^2. Mean velocity ratio of LVOT to aortic valve: 0.21. Valve area (Vmean): 0.65 cm^2. Indexed valve area (Vmean): 0.26 cm^2/m^2. Mean gradient (S): 41 mm Hg. Peak gradient (S): 73 mm Hg.  ------------------------------------------------------------------- Mitral valve: Moderately calcified annulus. Mildly thickened leaflets . Doppler: There was trivial regurgitation. Valve area by pressure half-time: 4.07 cm^2. Indexed valve area by pressure half-time: 1.65 cm^2/m^2. Valve area by continuity equation (using LVOT flow): 1.48 cm^2. Indexed valve area by continuity equation (using LVOT flow): 0.6 cm^2/m^2. Mean gradient (D): 8 mm Hg. Peak gradient (D): 13 mm  Hg.  ------------------------------------------------------------------- Left atrium: The atrium was mildly dilated.  ------------------------------------------------------------------- Right ventricle: The cavity size was normal. Wall thickness was normal. Systolic function was normal.  ------------------------------------------------------------------- Pulmonic valve: Structurally normal valve. Cusp separation was normal. Doppler: Transvalvular velocity was within the normal range. There was no regurgitation.  ------------------------------------------------------------------- Tricuspid valve: Structurally normal valve. Leaflet separation was normal. Doppler: Transvalvular velocity was within the normal range. There was mild regurgitation.  ------------------------------------------------------------------- Right atrium: The atrium was normal in size.  ------------------------------------------------------------------- Pericardium: There was no pericardial effusion.  ------------------------------------------------------------------- Measurements  Left ventricle Value Reference LV ID, ED, PLAX chordal 47.9 mm 43 - 52 LV ID, ES, PLAX chordal 34.2 mm 23 - 38 LV fx shortening, PLAX chordal 29 % >=29 LV PW thickness, ED 12.3 mm --------- IVS/LV PW ratio, ED 1.04 <=1.3 Stroke volume, 2D 71 ml --------- Stroke volume/bsa, 2D 29 ml/m^2 --------- LV e&', lateral 8.87 cm/s --------- LV E/e&', lateral 20.07 --------- LV e&', medial 6.04 cm/s --------- LV E/e&', medial 29.47 --------- LV e&',  average 7.46 cm/s --------- LV E/e&', average 23.88 ---------  Ventricular septum Value Reference IVS thickness, ED 12.8 mm ---------  LVOT Value Reference LVOT ID, S 20 mm --------- LVOT area 3.14 cm^2 --------- LVOT ID 20 mm --------- LVOT peak velocity, S 100 cm/s --------- LVOT mean velocity, S 61.5 cm/s --------- LVOT VTI, S 22.5 cm --------- LVOT peak gradient, S 4 mm Hg --------- Stroke volume (SV), LVOT DP 70.7 ml --------- Stroke index (SV/bsa), LVOT DP 28.7 ml/m^2 ---------  Aortic valve Value Reference Aortic valve peak velocity, S 427 cm/s --------- Aortic valve mean velocity, S 298 cm/s --------- Aortic valve VTI, S 97.1 cm --------- Aortic mean gradient, S 41 mm Hg --------- Aortic peak gradient, S 73 mm Hg --------- VTI ratio, LVOT/AV 0.23 --------- Aortic valve area, VTI 0.73 cm^2 --------- Aortic valve area/bsa, VTI 0.3 cm^2/m^2 --------- Velocity ratio, peak, LVOT/AV 0.23 --------- Aortic valve area, peak velocity 0.74 cm^2 --------- Aortic valve area/bsa, peak 0.3 cm^2/m^2 --------- velocity Velocity ratio, mean, LVOT/AV 0.21 --------- Aortic valve area, mean velocity 0.65 cm^2  --------- Aortic valve area/bsa, mean 0.26 cm^2/m^2 --------- velocity Aortic regurg pressure half-time 400 ms ---------  Aorta Value Reference Aortic root ID, ED 33 mm --------- Ascending aorta ID, A-P, S 32 mm ---------  Left atrium Value Reference LA ID, A-P, ES 45 mm --------- LA ID/bsa, A-P 1.82 cm/m^2 <=2.2 LA volume, S 77 ml --------- LA volume/bsa, S 31.2 ml/m^2 --------- LA volume, ES, 1-p A4C 83 ml --------- LA volume/bsa, ES, 1-p A4C  33.6 ml/m^2 --------- LA volume, ES, 1-p A2C 71 ml --------- LA volume/bsa, ES, 1-p A2C 28.8 ml/m^2 ---------  Mitral valve Value Reference Mitral E-wave peak velocity 178 cm/s --------- Mitral A-wave peak velocity 132 cm/s --------- Mitral mean velocity, D 132 cm/s --------- Mitral deceleration time (H) 296 ms 150 - 230 Mitral pressure half-time 54 ms --------- Mitral mean gradient, D 8 mm Hg --------- Mitral peak gradient, D 13 mm Hg --------- Mitral E/A ratio, peak 1.3 --------- Mitral valve area, PHT, DP 4.07 cm^2 --------- Mitral valve area/bsa, PHT, DP 1.65 cm^2/m^2 --------- Mitral valve area, LVOT 1.48 cm^2 --------- continuity Mitral valve area/bsa, LVOT 0.6 cm^2/m^2 --------- continuity Mitral annulus VTI, D  47.6 cm ---------  Pulmonary arteries Value Reference PA pressure, S, DP (H) 36 mm Hg <=30  Tricuspid valve Value Reference Tricuspid regurg peak velocity 287 cm/s --------- Tricuspid peak RV-RA gradient 33 mm Hg ---------  Systemic veins Value Reference Estimated CVP 3 mm Hg ---------  Right ventricle Value Reference RV pressure, S, DP (H) 36 mm Hg <=30 RV s&', lateral, S 16.2 cm/s ---------  Legend: (L) and (H) mark values outside specified reference range.  ------------------------------------------------------------------- Prepared and Electronically Authenticated by  Dorris Carnes, M.D. 2018-06-25T16:41:32   Right/Left Heart Cath and Coronary Angiography  Thoracic Aortogram  Conclusion     Acute Mrg lesion, 80 %stenosed.  Ost RCA to Prox RCA lesion, 70 %stenosed.  1st RPLB lesion, 100 %stenosed.  The left ventricular systolic function is normal.  LV end diastolic pressure is normal.  There is severe aortic valve stenosis. There is moderate (3+) aortic regurgitation.  There is moderate (3+) aortic regurgitation.  Mildly elevated right heart pressures with mild pulmonary hypertension.  Severe calcific aortic valve stenosis with reduced excursion and a mean gradient of 44 mmHg; an estimated valve area of 0.98 cm. Moderate aortic insufficiency.  Mild aortic root dilatation with atherosclerosis and calcification of the proximal aorta.  Normal LV systolic function with an ejection fraction of 55-60% without focal segmental wall motion abnormalities.  Single vessel coronary artery disease with 70% smooth ostial stenosis in a  large RCA, 80% stenosis in the marginal branch, and distal total occlusion of the PLA with left-to-right collateralization to the distal PLA segment.  RECOMMENDATION: Dr. Radford Pax was contacted regarding the results of the catheterization. The patient will be discharged home later today with plans for outpatient surgical consultation.   Indications   Severe calcific aortic valve stenosis [I35.0 (ICD-10-CM)]  Procedural Details/Technique   Technical Details Mr. Elise Gladden is a 56 year old male who had recently undergone partial right nephrectomy for renal carcinoma, has a remote history of abdominal embolism with bowel infarction and was found to have a lupus anticoagulant for which he has been treated with chronic warfarin therapy. He had not been seen in many years and recently an echo Doppler study has shown severe aortic stenosis with a peak instantaneous gradient of 74 mm and mean gradient of 42 mm with normal LV function. He is now referred for right and left heart cardiac catheterization.  The patient presented to the catheterization laboratory as an outpatient in the fasting state. He was premedicated with Versed 2 mg and fentanyl 50 g. His right femoral artery and right femoral veins were punctured anteriorly and a 5 French arterial sheath and 7 French venous sheath were inserted without difficulty. Right heart catheterization was done and pressure recordings were obtained in the RA, RV, PA, and PCW position. Cardiac outputs were obtained by both  the thermodilution method as well as the Fick method. A 6 French pigtail catheter was then inserted. Simultaneous aortic and pulmonary pressures were recorded. Oxygen saturations were obtained. Initial attempts at crossing the aortic valve with the pigtail catheter unsuccessful. Supravalvular aortography was then performed to document the aortic root size, valve excursion, and aortic insufficiency. The pigtail catheter was removed. Diagnostic  coronary angiography was done with 5 French Judkins 4 left and right coronary catheters. The right catheter was then used with straight wire and ultimately the aortic valve was crossed. Simultaneous LV and PC pressures were recorded. Left ventriculography was performed. A careful LV to Ao pullback was done. The patient tolerated the procedure well. Dr. Radford Pax was notified of the results. He left the catheterization laboratory with stable hemodynamics with plans for discharge home later today with outpatient surgical consultation.   Estimated blood loss <50 mL.  During this procedure the patient was administered the following to achieve and maintain moderate conscious sedation: Versed 2 mg, Fentanyl 50 mcg, while the patient's heart rate, blood pressure, and oxygen saturation were continuously monitored. The period of conscious sedation was 58 minutes, of which I was present face-to-face 100% of this time.    Coronary Findings   Dominance: Co-dominant  Left Main  Vessel was injected. Vessel is normal in caliber. Vessel is angiographically normal.  Left Anterior Descending  Vessel was injected. Vessel is normal in caliber. Vessel is angiographically normal.  Ramus Intermedius  Vessel was injected. Vessel is small. Vessel is angiographically normal.  Left Circumflex  Vessel was injected. Vessel is normal in caliber. Vessel is angiographically normal.  Right Coronary Artery  Ost RCA to Prox RCA lesion, 70% stenosed.  Acute Marginal Branch  Acute Mrg lesion, 80% stenosed.  First Right Posterolateral  Vessel is small in size. 1st RPLB filled by collaterals from Dist LAD.  1st RPLB lesion, 100% stenosed.  Vascular Findings   Abdominal Aorta Aortic Root: Supravalvular aortography revealed mild aortic root dilatation with atherosclerosis of the ascending aorta.    Right Heart   Right Heart Pressures RA: 12 RV: 40/10 PA: 39/21 PW: A wave 18, V wave 25, mean 15.  AO: 145/80 PA:  34/12  LV: 189/29 PW 20/23 Mean 19  Pullback: LV 178/30 AO: 133/80  Oxygen saturation in the aorta. 97% and in the PA 66%.  Cardiac output by the thermodilution method 7.1, and by the Fick method 5.7 liters per minute. Cardiac index by the thermodilution method 3.1 by the Fick method 2.5 L/m/m.  Aortic valve peak to peak (not peak instantaneous) gradient: 45 mm Hg Aortic valve mean gradient 44 mm Hg.  Aortic valve area 0.98 cm    Wall Motion              Left Heart   Left Ventricle The left ventricular size is normal. The left ventricular systolic function is normal. LV end diastolic pressure is normal. No regional wall motion abnormalities.    Aortic Valve There is severe aortic valve stenosis. There is moderate (3+) aortic regurgitation. The aortic valve is calcified. There is restricted aortic valve motion.    Aorta Aortic Root: The aortic root displays dilation. There is moderate (3+) aortic regurgitation.    Coronary Diagrams   Diagnostic Diagram       Implants        No implant documentation for this case.  PACS Images   Show images for Cardiac catheterization   Link to Procedure Log   Procedure Log  Hemo Data    Most Recent Value  Fick Cardiac Output 5.74 L/min  Fick Cardiac Output Index 2.48 (L/min)/BSA  Thermal Cardiac Output 7.12 L/min  Thermal Cardiac Output Index 3.08 (L/min)/BSA  Aortic Mean Gradient 43.5 mmHg  Aortic Peak Gradient 45 mmHg  Aortic Valve Area 0.98  Aortic Value Area Index 0.42 cm2/BSA  RA A Wave 12 mmHg  RA V Wave 12 mmHg  RA Mean 10 mmHg  RV Systolic Pressure 40 mmHg  RV Diastolic Pressure 2 mmHg  RV EDP 10 mmHg  PA Systolic Pressure 34 mmHg  PA Diastolic Pressure 12 mmHg  PA Mean 27 mmHg  PW A Wave 24 mmHg  PW V Wave 27 mmHg  PW Mean 23 mmHg  AO Systolic Pressure 341 mmHg  AO Diastolic Pressure 66 mmHg  AO Mean 87 mmHg  LV Systolic Pressure 962 mmHg  LV Diastolic Pressure 9 mmHg  LV EDP 32  mmHg  Arterial Occlusion Pressure Extended Systolic Pressure 229 mmHg  Arterial Occlusion Pressure Extended Diastolic Pressure 80 mmHg  Arterial Occlusion Pressure Extended Mean Pressure 102 mmHg  Left Ventricular Apex Extended Systolic Pressure 798 mmHg  Left Ventricular Apex Extended Diastolic Pressure 11 mmHg  Left Ventricular Apex Extended EDP Pressure 30 mmHg  TPVR Index 10.06 HRUI  TSVR Index 35.04 HRUI  PVR SVR Ratio 0.16  TPVR/TSVR Ratio 0.29     STS Risk Calculator  Procedure                                          AVR + CABG  Risk of Mortality                                1.3% Morbidity or Mortality                       12.5% Prolonged LOS                                   5.0% Short LOS                                           49.3% Permanent Stroke                             0.8% Prolonged Vent Support                     7.4% DSW Infection                                     0.5% Renal Failure                                       2.6% Reoperation  5.5%   Cardiac TAVR CT  TECHNIQUE: The patient was scanned on a [employer]. A 120 kV retrospective scan was triggered in the descending thoracic aorta at 111 HU's. Gantry rotation speed was 250 msecs and collimation was .6 mm. No beta blockade or nitro were given. The 3D data set was reconstructed in 5% intervals of the R-R cycle. Systolic and diastolic phases were analyzed on a dedicated work station using MPR, MIP and VRT modes. The patient received 80 cc of contrast.  FINDINGS: Aortic Valve: Trileaflet, severely thickened and calcified with severely restricted leaflet opening. Moderate calcifications extending into the LVOT.  Aorta: Normal size, mild calcifications, no dissection.  Sinotubular Junction: 30 x 28 mm  Ascending Thoracic Aorta: 35 x 34 mm  Aortic Arch: 30 x 26 mm  Descending Thoracic Aorta: 25 x 25 mm  Sinus of  Valsalva Measurements:  Non-coronary: 33 mm  Right -coronary: 33 mm  Left -coronary: 34 mm  Coronary Artery Height above Annulus:  Left Main: 14 mm  Right Coronary: 13 mm  Virtual Basal Annulus Measurements:  Maximum/Minimum Diameter: 28 x 21 mm  Perimeter: 78 mm  Area: 460 mm2  IMPRESSION: 1. Trileaflet, severely thickened and calcified aortic valve with severely restricted leaflet opening. Moderate calcifications extending into the LVOT.  2. Normal size of the thoracic aorta with mild calcifications and no dissection.  3. Moderate circumferential mitral annular calcifications.  4. No thrombus in the left atrial appendage.  5. No ASD/VSD.  6. Mildly dilated pulmonary artery measuring 30 mm.  Ena Dawley   Electronically Signed By: Ena Dawley On: 11/19/2016 19:19   Impression:  Patient has stage D severe symptomatic aortic stenosis and single-vessel coronary artery disease. He presents with a gradual progression of symptoms of exertional shortness of breath, chest tightness, and fatigue consistent with chronic diastolic congestive heart failure and angina pectoris, New York Heart Association functional class III. I have personally reviewed the patient's recent transthoracic echocardiogram and diagnostic cardiac catheterization. Echocardiogram confirms the presence of severe aortic stenosis. The aortic valve is trileaflet with severe thickening, restricted leaflet mobility, and moderate calcification involving all 3 leaflets. Peak velocity across the aortic valve measured 4.3 m/s corresponding to mean transvalvular gradient estimated 41 mmHg. Left ventricular systolic function appears normal. Diagnostic cardiac catheterization confirmed the presence of severe aortic stenosis with mean transvalvular gradient measured 44 mmHg at catheterization. Catheterization also reveals significant single-vessel coronary artery disease with  tubular 70% ostial stenosis of the right coronary artery. There is no significant flow limiting disease in the left coronary system. Pulmonary artery pressures were mildly elevated. I agree the patient would best be treated with aortic valve replacement and coronary artery bypass grafting. Risks associated with conventional surgery should be reasonably low although they may be somewhat higher than that predicted using the STS risk calculator because of the patient's history of mantle radiation therapy to the chest in the past for Hodgkin's lymphoma. In addition, the patient is chronically anticoagulated using warfarin for presumed hypercoagulable state secondary to antiphospholipid antibody syndrome.  Plan:  The patient and his wife wereagain counseled at length regarding treatment alternatives for management of severe aortic stenosis and coronary artery disease including continued medical therapy versus proceeding with aortic valve replacement and coronary artery bypass graftingin the near future. The natural history of aortic stenosis was reviewed, as was long term prognosis with medical therapy alone.  Expectations for his postoperative recovery have been discussed. Discussion was held comparing the relative risks of  mechanical valve replacement with need for lifelong anticoagulation versus use of a bioprosthetic tissue valve and the associated potential for late structural valve deterioration and failure. This discussion was placed in the context of the patient's particular circumstances, and as a result the patient specifically requests that their valve be replaced using a mechanical valve since he remains chronically anticoagulated using warfarin.   They understand and accept all potential risks of surgery including but not limited to risk of death, stroke or other neurologic complication, myocardial infarction, congestive heart failure, respiratory failure, renal failure, bleeding requiring  transfusion and/or reexploration, arrhythmia, infection or other wound complications, pneumonia, pleural and/or pericardial effusion, pulmonary embolus, aortic dissection or other major vascular complication, or delayed complications related to valve repair or replacement including but not limited to structural valve deterioration and failure, thrombosis, embolization, endocarditis, or paravalvular leak.  All of their questions have been answered.     I spent in excess of 15 minutes during the conduct of this office consultation and >50% of this time involved direct face-to-face encounter with the patient for counseling and/or coordination of their care.    Valentina Gu. Roxy Manns, MD 11/25/2016 2:06 PM

## 2016-11-29 NOTE — Transfer of Care (Signed)
Immediate Anesthesia Transfer of Care Note  Patient: Michael Delgado  Procedure(s) Performed: Procedure(s): AORTIC VALVE REPLACEMENT (AVR) using 23MM [employer]medics Valve (N/A) CORONARY ARTERY BYPASS GRAFTING (CABG) x one, using right leg greater saphenous vein harvested endoscopically (N/A) TRANSESOPHAGEAL ECHOCARDIOGRAM (TEE) (N/A)  Patient Location: SICU  Anesthesia Type:General  Level of Consciousness: Patient remains intubated per anesthesia plan  Airway & Oxygen Therapy: Patient remains intubated per anesthesia plan  Post-op Assessment: Report given to RN and Post -op Vital signs reviewed and stable  Post vital signs: Reviewed and stable  Last Vitals:  Vitals:   11/29/16 0551 11/29/16 1358  BP: 125/74 105/61  Pulse: 79   Resp: 18   Temp: 36.8 C   SpO2: 96%     Last Pain:  Vitals:   11/29/16 0551  TempSrc: Oral         Complications: No apparent anesthesia complications

## 2016-11-29 NOTE — Brief Op Note (Signed)
11/29/2016  11:54 AM  PATIENT:  Michael Delgado  56 y.o. male  PRE-OPERATIVE DIAGNOSIS:  aortic stenosis, coronary artery disease  POST-OPERATIVE DIAGNOSIS:  aortic stenosis, coronary artery disease  PROCEDURE:  Procedure(s): AORTIC VALVE REPLACEMENT (AVR) using 23MM [employer]med[employer] Valve (N/A) CORONARY ARTERY BYPASS GRAFTING (CABG) x one, using right leg greater saphenous vein harvested endoscopically (N/A) TRANSESOPHAGEAL ECHOCARDIOGRAM (TEE) (N/A) SVG-RCA  SURGEON:  Surgeon(s) and Role:    Rexene Alberts, MD - Primary  PHYSICIAN ASSISTANT: WAYNE GOLD PA-C  ANESTHESIA:   general  EBL:  Total I/O In: -  Out: 850 [Urine:850]  BLOOD ADMINISTERED:none  DRAINS:   LOCAL MEDICATIONS USED:  NONE  SPECIMEN:  Source of Specimen:  AORTIC VALVE LEAFLETS  DISPOSITION OF SPECIMEN:  PATHOLOGY  COUNTS:  YES  TOURNIQUET:  * No tourniquets in log *  DICTATION: .Dragon Dictation  PLAN OF CARE: Admit to inpatient   PATIENT DISPOSITION:  ICU - intubated and hemodynamically stable.   Delay start of Pharmacological VTE agent (>24hrs) due to surgical blood loss or risk of bleeding: yes  COMPLICATIONS: NO KNOWN

## 2016-11-29 NOTE — Anesthesia Postprocedure Evaluation (Signed)
Anesthesia Post Note  Patient: Michael Delgado  Procedure(s) Performed: Procedure(s) (LRB): AORTIC VALVE REPLACEMENT (AVR) using 23MM [employer]med[employer] Valve (N/A) CORONARY ARTERY BYPASS GRAFTING (CABG) x one, using right leg greater saphenous vein harvested endoscopically (N/A) TRANSESOPHAGEAL ECHOCARDIOGRAM (TEE) (N/A)     Patient location during evaluation: SICU Anesthesia Type: General Level of consciousness: sedated Pain management: pain level controlled Vital Signs Assessment: post-procedure vital signs reviewed and stable Respiratory status: patient remains intubated per anesthesia plan Cardiovascular status: stable Anesthetic complications: no    Last Vitals:  Vitals:   11/29/16 1415 11/29/16 1430  BP:    Pulse:  67  Resp: 19 16  Temp: 36.6 C 36.7 C  SpO2:  98%    Last Pain:  Vitals:   11/29/16 0551  TempSrc: Oral                 Michael Delgado,W. EDMOND

## 2016-11-30 ENCOUNTER — Inpatient Hospital Stay (HOSPITAL_COMMUNITY): Payer: Managed Care, Other (non HMO)

## 2016-11-30 LAB — GLUCOSE, CAPILLARY
GLUCOSE-CAPILLARY: 109 mg/dL — AB (ref 65–99)
GLUCOSE-CAPILLARY: 109 mg/dL — AB (ref 65–99)
GLUCOSE-CAPILLARY: 110 mg/dL — AB (ref 65–99)
GLUCOSE-CAPILLARY: 112 mg/dL — AB (ref 65–99)
GLUCOSE-CAPILLARY: 146 mg/dL — AB (ref 65–99)
GLUCOSE-CAPILLARY: 199 mg/dL — AB (ref 65–99)
GLUCOSE-CAPILLARY: 98 mg/dL (ref 65–99)
Glucose-Capillary: 122 mg/dL — ABNORMAL HIGH (ref 65–99)
Glucose-Capillary: 131 mg/dL — ABNORMAL HIGH (ref 65–99)
Glucose-Capillary: 113 mg/dL — ABNORMAL HIGH (ref 65–99)
Glucose-Capillary: 110 mg/dL — ABNORMAL HIGH (ref 65–99)
Glucose-Capillary: 117 mg/dL — ABNORMAL HIGH (ref 65–99)
Glucose-Capillary: 103 mg/dL — ABNORMAL HIGH (ref 65–99)
Glucose-Capillary: 104 mg/dL — ABNORMAL HIGH (ref 65–99)
Glucose-Capillary: 114 mg/dL — ABNORMAL HIGH (ref 65–99)

## 2016-11-30 LAB — CBC
HCT: 32.3 % — ABNORMAL LOW (ref 39.0–52.0)
HEMATOCRIT: 31.5 % — AB (ref 39.0–52.0)
HEMATOCRIT: 33.2 % — AB (ref 39.0–52.0)
HEMOGLOBIN: 10.1 g/dL — AB (ref 13.0–17.0)
HEMOGLOBIN: 10.8 g/dL — AB (ref 13.0–17.0)
Hemoglobin: 10.6 g/dL — ABNORMAL LOW (ref 13.0–17.0)
MCH: 26.9 pg (ref 26.0–34.0)
MCH: 27.3 pg (ref 26.0–34.0)
MCH: 27.6 pg (ref 26.0–34.0)
MCHC: 32.1 g/dL (ref 30.0–36.0)
MCHC: 32.8 g/dL (ref 30.0–36.0)
MCHC: 32.5 g/dL (ref 30.0–36.0)
MCV: 83.8 fL (ref 78.0–100.0)
MCV: 83.2 fL (ref 78.0–100.0)
MCV: 84.9 fL (ref 78.0–100.0)
Platelets: 113 10*3/uL — ABNORMAL LOW (ref 150–400)
Platelets: 128 10*3/uL — ABNORMAL LOW (ref 150–400)
Platelets: 122 10*3/uL — ABNORMAL LOW (ref 150–400)
RBC: 3.76 MIL/uL — ABNORMAL LOW (ref 4.22–5.81)
RBC: 3.88 MIL/uL — ABNORMAL LOW (ref 4.22–5.81)
RBC: 3.91 MIL/uL — AB (ref 4.22–5.81)
RDW: 14 % (ref 11.5–15.5)
RDW: 14 % (ref 11.5–15.5)
RDW: 14.4 % (ref 11.5–15.5)
WBC: 10.6 10*3/uL — ABNORMAL HIGH (ref 4.0–10.5)
WBC: 12.8 10*3/uL — ABNORMAL HIGH (ref 4.0–10.5)
WBC: 13 10*3/uL — AB (ref 4.0–10.5)

## 2016-11-30 LAB — POCT I-STAT, CHEM 8
BUN: 17 mg/dL (ref 6–20)
CALCIUM ION: 1.09 mmol/L — AB (ref 1.15–1.40)
CHLORIDE: 96 mmol/L — AB (ref 101–111)
Creatinine, Ser: 0.8 mg/dL (ref 0.61–1.24)
Glucose, Bld: 132 mg/dL — ABNORMAL HIGH (ref 65–99)
HEMATOCRIT: 33 % — AB (ref 39.0–52.0)
Hemoglobin: 11.2 g/dL — ABNORMAL LOW (ref 13.0–17.0)
POTASSIUM: 4.2 mmol/L (ref 3.5–5.1)
SODIUM: 135 mmol/L (ref 135–145)
TCO2: 25 mmol/L (ref 0–100)

## 2016-11-30 LAB — MAGNESIUM
MAGNESIUM: 2 mg/dL (ref 1.7–2.4)
Magnesium: 2.4 mg/dL (ref 1.7–2.4)

## 2016-11-30 LAB — POCT I-STAT 3, ART BLOOD GAS (G3+)
Acid-base deficit: 2 mmol/L (ref 0.0–2.0)
BICARBONATE: 23.9 mmol/L (ref 20.0–28.0)
O2 Saturation: 96 %
PCO2 ART: 46 mmHg (ref 32.0–48.0)
PO2 ART: 90 mmHg (ref 83.0–108.0)
Patient temperature: 37.2
TCO2: 25 mmol/L (ref 0–100)
pH, Arterial: 7.325 — ABNORMAL LOW (ref 7.350–7.450)

## 2016-11-30 LAB — BASIC METABOLIC PANEL
Anion gap: 5 (ref 5–15)
BUN: 17 mg/dL (ref 6–20)
CALCIUM: 7.4 mg/dL — AB (ref 8.9–10.3)
CHLORIDE: 107 mmol/L (ref 101–111)
CO2: 23 mmol/L (ref 22–32)
CREATININE: 0.85 mg/dL (ref 0.61–1.24)
GFR calc non Af Amer: >60 mL/min (ref >60)
GLUCOSE: 110 mg/dL — AB (ref 65–99)
Potassium: 4.8 mmol/L (ref 3.5–5.1)
Sodium: 135 mmol/L (ref 135–145)

## 2016-11-30 LAB — CREATININE, SERUM
Creatinine, Ser: 0.95 mg/dL (ref 0.61–1.24)
Creatinine, Ser: 0.88 mg/dL (ref 0.61–1.24)
GFR calc Af Amer: >60 mL/min (ref >60)
GFR calc non Af Amer: >60 mL/min (ref >60)
GFR calc non Af Amer: >60 mL/min (ref >60)

## 2016-11-30 MED ORDER — INSULIN ASPART 100 UNIT/ML ~~LOC~~ SOLN
0.0000 [IU] | SUBCUTANEOUS | Status: DC
  Administered 2016-11-30: 4 [IU] via SUBCUTANEOUS
  Administered 2016-11-30 – 2016-12-02 (×5): 2 [IU] via SUBCUTANEOUS

## 2016-11-30 MED ORDER — AMIODARONE LOAD VIA INFUSION
150.0000 mg | Freq: Once | INTRAVENOUS | Status: AC
Start: 2016-11-30 — End: 2016-11-30
  Administered 2016-11-30: 150 mg via INTRAVENOUS
  Filled 2016-11-30: qty 83.34

## 2016-11-30 MED ORDER — ENOXAPARIN SODIUM 30 MG/0.3ML ~~LOC~~ SOLN
30.0000 mg | Freq: Every day | SUBCUTANEOUS | Status: DC
  Administered 2016-11-30 – 2016-12-05 (×6): 30 mg via SUBCUTANEOUS
  Filled 2016-11-30 (×6): qty 0.3

## 2016-11-30 MED ORDER — WARFARIN SODIUM 2.5 MG PO TABS
2.5000 mg | ORAL_TABLET | Freq: Every day | ORAL | Status: DC
  Administered 2016-11-30 – 2016-12-01 (×2): 2.5 mg via ORAL
  Filled 2016-11-30 (×2): qty 1

## 2016-11-30 MED ORDER — FUROSEMIDE 10 MG/ML IJ SOLN
40.0000 mg | Freq: Once | INTRAMUSCULAR | Status: AC
  Administered 2016-11-30: 40 mg via INTRAVENOUS
  Filled 2016-11-30: qty 4

## 2016-11-30 MED ORDER — WARFARIN - PHYSICIAN DOSING INPATIENT: Freq: Every day | Status: DC

## 2016-11-30 MED ORDER — AMIODARONE HCL IN DEXTROSE 360-4.14 MG/200ML-% IV SOLN
30.0000 mg/h | INTRAVENOUS | Status: DC
  Administered 2016-12-01: 30 mg/h via INTRAVENOUS
  Filled 2016-11-30 (×2): qty 200

## 2016-11-30 MED ORDER — INSULIN DETEMIR 100 UNIT/ML ~~LOC~~ SOLN
20.0000 [IU] | Freq: Every day | SUBCUTANEOUS | Status: DC
  Administered 2016-12-01 – 2016-12-02 (×2): 20 [IU] via SUBCUTANEOUS
  Filled 2016-11-30 (×2): qty 0.2

## 2016-11-30 MED ORDER — INSULIN DETEMIR 100 UNIT/ML ~~LOC~~ SOLN
20.0000 [IU] | Freq: Once | SUBCUTANEOUS | Status: AC
  Administered 2016-11-30: 20 [IU] via SUBCUTANEOUS
  Filled 2016-11-30: qty 0.2

## 2016-11-30 MED ORDER — AMIODARONE HCL IN DEXTROSE 360-4.14 MG/200ML-% IV SOLN
60.0000 mg/h | INTRAVENOUS | Status: DC
  Administered 2016-11-30 (×2): 60 mg/h via INTRAVENOUS
  Filled 2016-11-30 (×2): qty 200

## 2016-11-30 NOTE — Progress Notes (Signed)
Patient ID: Taha Dimond, male   DOB: 1961/02/07, 56 y.o.   MRN: [id number] [facility] DAILY ICU PROGRESS NOTE                   [address]            [place] [address]          [phone]   1 Day Post-Op Procedure(s) (LRB): AORTIC VALVE REPLACEMENT (AVR) using 23MM [employer]medics Valve (N/A) CORONARY ARTERY BYPASS GRAFTING (CABG) x one, using right leg greater saphenous vein harvested endoscopically (N/A) TRANSESOPHAGEAL ECHOCARDIOGRAM (TEE) (N/A)  Total Length of Stay:  LOS: 1 day   Subjective: Awake and alert , extubated last pm, sinus underlying a pacing   Objective: Vital signs in last 24 hours: Temp:  [97.9 F (36.6 C)-99.5 F (37.5 C)] 99.1 F (37.3 C) (08/18 0700) Pulse Rate:  [53-90] 75 (08/18 0700) Cardiac Rhythm: Atrial paced (08/17 2000) Resp:  [11-24] 15 (08/18 0700) BP: (91-121)/(52-84) 101/71 (08/18 0700) SpO2:  [94 %-99 %] 96 % (08/18 0700) Arterial Line BP: (93-145)/(51-81) 109/51 (08/18 0700) FiO2 (%):  [40 %-50 %] 40 % (08/17 1738) Weight:  [281 lb 4.9 oz (127.6 kg)] 281 lb 4.9 oz (127.6 kg) (08/18 0520)  Filed Weights   11/29/16 0551 11/30/16 0520  Weight: 267 lb (121.1 kg) 281 lb 4.9 oz (127.6 kg)    Weight change: 14 lb 4.9 oz (6.49 kg)   Hemodynamic parameters for last 24 hours: PAP: (29-49)/(16-33) 31/17 CO:  [2.9 L/min-8.4 L/min] 5.8 L/min CI:  [1.2 L/min/m2-3.6 L/min/m2] 2.5 L/min/m2  Intake/Output from previous day: 08/17 0701 - 08/18 0700 In: 8128.9 [P.O.:720; I.V.:5267.9; Blood:441; NG/GT:30; IV DJMEQASTM:1962] Out: 2297 [Urine:2230; Blood:1700; Chest Tube:380]  Intake/Output this shift: No intake/output data recorded.  Current Meds: Scheduled Meds: . acetaminophen  1,000 mg Oral Q6H   Or  . acetaminophen (TYLENOL) oral liquid 160 mg/5 mL  1,000 mg Per Tube Q6H  . aspirin EC  325 mg Oral Daily   Or  . aspirin  324 mg Per Tube Daily  . bisacodyl  10 mg Oral Daily   Or  . bisacodyl  10 mg Rectal Daily  .  chlorhexidine  15 mL Mouth Rinse BID  . Chlorhexidine Gluconate Cloth  6 each Topical Daily  . Chlorhexidine Gluconate Cloth  6 each Topical Daily  . docusate sodium  200 mg Oral Daily  . insulin regular  0-10 Units Intravenous TID WC  . levothyroxine  50 mcg Oral QAC breakfast  . mouth rinse  15 mL Mouth Rinse q12n4p  . metoprolol tartrate  12.5 mg Oral BID   Or  . metoprolol tartrate  12.5 mg Per Tube BID  . mupirocin ointment  1 application Nasal BID  . [START ON 12/01/2016] pantoprazole  40 mg Oral Daily  . sodium chloride flush  10-40 mL Intracatheter Q12H  . sodium chloride flush  3 mL Intravenous Q12H   Continuous Infusions: . sodium chloride 20 mL/hr at 11/30/16 0500  . sodium chloride    . sodium chloride 1 mL/hr at 11/30/16 0500  . albumin human    . cefUROXime (ZINACEF)  IV Stopped (11/30/16 0030)  . dexmedetomidine (PRECEDEX) IV infusion Stopped (11/30/16 0530)  . famotidine (PEPCID) IV Stopped (11/29/16 1430)  . insulin (NOVOLIN-R) infusion 3 Units/hr (11/30/16 0700)  . lactated ringers 10 mL/hr at 11/30/16 0500  . lactated ringers    . nitroGLYCERIN Stopped (11/29/16 1500)  . phenylephrine (NEO-SYNEPHRINE) Adult infusion Stopped (  11/29/16 1810)   PRN Meds:.sodium chloride, albumin human, metoprolol tartrate, midazolam, morphine injection, ondansetron (ZOFRAN) IV, oxyCODONE, sodium chloride flush, sodium chloride flush, traMADol  General appearance: alert and cooperative Neurologic: intact Heart: regular rate and rhythm, S1, S2 normal, no murmur, click, rub or gallop Lungs: diminished breath sounds bibasilar Abdomen: soft, non-tender; bowel sounds normal; no masses,  no organomegaly Extremities: extremities normal, atraumatic, no cyanosis or edema and Homans sign is negative, no sign of DVT Wound: sternum stable   Lab Results: CBC: Recent Labs  11/29/16 1939 11/29/16 2002 11/30/16 0430  WBC 9.0  --  10.6*  HGB 10.4* 10.5* 10.1*  HCT 32.3* 31.0* 31.5*  PLT  112*  --  113*   BMET:  Recent Labs  11/29/16 2002 11/30/16 0430  NA 138 135  K 4.9 4.8  CL 105 107  CO2  --  23  GLUCOSE 131* 110*  BUN 21* 17  CREATININE 0.80 0.85  CALCIUM  --  7.4*    CMET: Lab Results  Component Value Date   WBC 10.6 (H) 11/30/2016   HGB 10.1 (L) 11/30/2016   HCT 31.5 (L) 11/30/2016   PLT 113 (L) 11/30/2016   GLUCOSE 110 (H) 11/30/2016   ALT 36 11/25/2016   AST 26 11/25/2016   NA 135 11/30/2016   K 4.8 11/30/2016   CL 107 11/30/2016   CREATININE 0.85 11/30/2016   BUN 17 11/30/2016   CO2 23 11/30/2016   INR 1.35 11/29/2016   HGBA1C 6.3 (H) 11/25/2016      PT/INR:  Recent Labs  11/29/16 1400  LABPROT 16.7*  INR 1.35   Radiology: Dg Chest Port 1 View  Result Date: 11/30/2016 CLINICAL DATA:  Atelectasis. EXAM: PORTABLE CHEST 1 VIEW COMPARISON:  11/29/2016 and CT chest 8 6 18. FINDINGS: Interval extubation. Nasogastric tube has been removed. Right IJ Swan-Ganz catheter tip projects over the right pulmonary artery. Mediastinal drains remain in place. Trachea is midline. Cardiomediastinal silhouette is enlarged, as before. Low lung volumes with mild pulmonary vascular prominence. Thickening along the minor fissure. No pneumothorax. IMPRESSION: 1. Low lung volumes with central pulmonary can vascular congestion. Difficult to exclude mild edema. 2. Atelectasis or fluid along the minor fissure. Electronically Signed   By: Lorin Picket M.D.   On: 11/30/2016 08:00   Dg Chest Port 1 View  Result Date: 11/29/2016 CLINICAL DATA:  Followup CABG EXAM: PORTABLE CHEST 1 VIEW COMPARISON:  11/25/2016 FINDINGS: Median sternotomy and CABG. Endotracheal tube tip 4 cm above the carina. Nasogastric tube enters the stomach with its tip in the antrum. Right internal jugular Swan-Ganz catheter tip in the right main pulmonary artery. Mediastinal drain in place. Moderate atelectasis in the right middle lobe. Lungs otherwise clear. No frank edema. No effusion or  pneumothorax. IMPRESSION: Lines and tubes well positioned. Partial atelectasis right middle lobe. Electronically Signed   By: Nelson Chimes M.D.   On: 11/29/2016 14:13     Assessment/Plan: S/P Procedure(s) (LRB): AORTIC VALVE REPLACEMENT (AVR) using 23MM [employer]med[employer] Valve (N/A) CORONARY ARTERY BYPASS GRAFTING (CABG) x one, using right leg greater saphenous vein harvested endoscopically (N/A) TRANSESOPHAGEAL ECHOCARDIOGRAM (TEE) (N/A) Mobilize Diuresis Diabetes control d/c tubes/lines Continue foley due to strict I&O, patient in ICU and urinary output monitoring See progression orders Expected Acute  Blood - loss Anemia Started on coumadin for mechanical valve    [name] 11/30/2016 8:51 AM

## 2016-11-30 NOTE — Plan of Care (Signed)
Problem: Activity: Goal: Risk for activity intolerance will decrease Outcome: Progressing Pt out of bed and ambulated today  Problem: Cardiac: Goal: Hemodynamic stability will improve Outcome: Progressing Pt went into afib today, converted back to SR this evening  Problem: Nutritional: Goal: Risk for body nutrition deficit will decrease Outcome: Not Progressing Pt with poor appetite, needs lots of encouragement to take in diet  Problem: Pain Management: Goal: Pain level will decrease Outcome: Not Progressing Pt requiring frequent pain meds  Problem: Tissue Perfusion: Goal: Risk of venous thrombosis will decrease Outcome: Progressing SCDs on, Lovenox started today

## 2016-11-30 NOTE — Progress Notes (Signed)
Patient ID: Michael Delgado, male   DOB: March 02, 1961, 56 y.o.   MRN: [id number] EVENING ROUNDS NOTE :     [address]       [place] [address]             [phone]                 1 Day Post-Op Procedure(s) (LRB): AORTIC VALVE REPLACEMENT (AVR) using 23MM [employer]med[employer] Valve (N/A) CORONARY ARTERY BYPASS GRAFTING (CABG) x one, using right leg greater saphenous vein harvested endoscopically (N/A) TRANSESOPHAGEAL ECHOCARDIOGRAM (TEE) (N/A)  Total Length of Stay:  LOS: 1 day  BP 110/68   Pulse 91   Temp 98.7 F (37.1 C) (Oral)   Resp 14   Ht 5' 9.5" (1.765 m)   Wt 281 lb 4.9 oz (127.6 kg)   SpO2 96%   BMI 40.95 kg/m   .Intake/Output      08/18 0701 - 08/19 0700   P.O. 360   I.V. (mL/kg) 314.8 (2.5)   IV Piggyback 50   Total Intake(mL/kg) 724.8 (5.7)   Urine (mL/kg/hr) 2475 (1.6)   Chest Tube 25   Total Output 2500   Net -1775.2         . sodium chloride 20 mL/hr at 11/30/16 0500  . sodium chloride    . sodium chloride 1 mL/hr at 11/30/16 0500  . amiodarone 60 mg/hr (11/30/16 1728)   Followed by  . amiodarone    . cefUROXime (ZINACEF)  IV Stopped (11/30/16 1303)  . dexmedetomidine (PRECEDEX) IV infusion Stopped (11/30/16 0530)  . insulin (NOVOLIN-R) infusion 3 Units/hr (11/30/16 0700)  . lactated ringers 10 mL/hr at 11/30/16 0500  . lactated ringers    . nitroGLYCERIN Stopped (11/29/16 1500)  . phenylephrine (NEO-SYNEPHRINE) Adult infusion Stopped (11/29/16 1810)     Lab Results  Component Value Date   WBC 13.0 (H) 11/30/2016   HGB 11.2 (L) 11/30/2016   HCT 33.0 (L) 11/30/2016   PLT 122 (L) 11/30/2016   GLUCOSE 132 (H) 11/30/2016   ALT 36 11/25/2016   AST 26 11/25/2016   NA 135 11/30/2016   K 4.2 11/30/2016   CL 96 (L) 11/30/2016   CREATININE 0.80 11/30/2016   BUN 17 11/30/2016   CO2 23 11/30/2016   INR 1.35 11/29/2016   HGBA1C 6.3 (H) 11/25/2016   Alert Walked in hall and went into afib Started on iv Cordarone  Coumadin stared    [name] MD  Beeper [phone] Office [phone] 11/30/2016 7:16 PM

## 2016-11-30 NOTE — Progress Notes (Signed)
Pt. noted to be in Afib s/p ambulation. Dr Servando Snare notified, orders received.

## 2016-12-01 ENCOUNTER — Inpatient Hospital Stay (HOSPITAL_COMMUNITY): Payer: Managed Care, Other (non HMO)

## 2016-12-01 LAB — BASIC METABOLIC PANEL
Anion gap: 7 (ref 5–15)
BUN: 14 mg/dL (ref 6–20)
CO2: 28 mmol/L (ref 22–32)
Calcium: 7.7 mg/dL — ABNORMAL LOW (ref 8.9–10.3)
Chloride: 97 mmol/L — ABNORMAL LOW (ref 101–111)
Creatinine, Ser: 0.75 mg/dL (ref 0.61–1.24)
GFR calc Af Amer: >60 mL/min (ref >60)
GFR calc non Af Amer: >60 mL/min (ref >60)
Glucose, Bld: 146 mg/dL — ABNORMAL HIGH (ref 65–99)
Potassium: 4.5 mmol/L (ref 3.5–5.1)
Sodium: 132 mmol/L — ABNORMAL LOW (ref 135–145)

## 2016-12-01 LAB — CBC
HCT: 30.6 % — ABNORMAL LOW (ref 39.0–52.0)
Hemoglobin: 9.9 g/dL — ABNORMAL LOW (ref 13.0–17.0)
MCH: 27.1 pg (ref 26.0–34.0)
MCHC: 32.4 g/dL (ref 30.0–36.0)
MCV: 83.8 fL (ref 78.0–100.0)
Platelets: 127 10*3/uL — ABNORMAL LOW (ref 150–400)
RBC: 3.65 MIL/uL — ABNORMAL LOW (ref 4.22–5.81)
RDW: 14.1 % (ref 11.5–15.5)
WBC: 13.1 10*3/uL — ABNORMAL HIGH (ref 4.0–10.5)

## 2016-12-01 LAB — GLUCOSE, CAPILLARY
GLUCOSE-CAPILLARY: 129 mg/dL — AB (ref 65–99)
GLUCOSE-CAPILLARY: 129 mg/dL — AB (ref 65–99)
Glucose-Capillary: 102 mg/dL — ABNORMAL HIGH (ref 65–99)
Glucose-Capillary: 129 mg/dL — ABNORMAL HIGH (ref 65–99)
Glucose-Capillary: 134 mg/dL — ABNORMAL HIGH (ref 65–99)

## 2016-12-01 LAB — PROTIME-INR
INR: 1.26
Prothrombin Time: 15.9 seconds — ABNORMAL HIGH (ref 11.4–15.2)

## 2016-12-01 MED ORDER — AMIODARONE HCL 200 MG PO TABS
400.0000 mg | ORAL_TABLET | Freq: Two times a day (BID) | ORAL | Status: DC
  Administered 2016-12-01 – 2016-12-03 (×4): 400 mg via ORAL
  Filled 2016-12-01 (×5): qty 2

## 2016-12-01 MED ORDER — AMIODARONE HCL 200 MG PO TABS: 400.0000 mg | ORAL_TABLET | Freq: Every day | ORAL | Status: DC

## 2016-12-01 NOTE — Progress Notes (Signed)
Patient ID: Michael Delgado, male   DOB: Jan 09, 1961, 56 y.o.   MRN: [id number] TCTS DAILY ICU PROGRESS NOTE                   [address]            [place] [address]          [phone]   2 Days Post-Op Procedure(s) (LRB): AORTIC VALVE REPLACEMENT (AVR) using 23MM [employer] Valve (N/A) CORONARY ARTERY BYPASS GRAFTING (CABG) x one, using right leg greater saphenous vein harvested endoscopically (N/A) TRANSESOPHAGEAL ECHOCARDIOGRAM (TEE) (N/A)  Total Length of Stay:  LOS: 2 days   Subjective: Awake and alert, walked in unit this am, converted to sinus midnight   Objective: Vital signs in last 24 hours: Temp:  [98.4 F (36.9 C)-99.1 F (37.3 C)] 98.5 F (36.9 C) (08/19 0400) Pulse Rate:  [71-105] 74 (08/19 0700) Cardiac Rhythm: Normal sinus rhythm (08/18 2200) Resp:  [12-26] 13 (08/19 0700) BP: (98-139)/(59-79) 105/73 (08/19 0600) SpO2:  [91 %-96 %] 91 % (08/19 0700) Arterial Line BP: (108-146)/(48-66) 146/66 (08/18 1400) Weight:  [276 lb 3.8 oz (125.3 kg)] 276 lb 3.8 oz (125.3 kg) (08/19 0615)  Filed Weights   11/29/16 0551 11/30/16 0520 12/01/16 0615  Weight: 267 lb (121.1 kg) 281 lb 4.9 oz (127.6 kg) 276 lb 3.8 oz (125.3 kg)    Weight change: -5 lb 1.1 oz (-2.3 kg)   Hemodynamic parameters for last 24 hours: PAP: (27-34)/(16-21) 34/21  Intake/Output from previous day: 08/18 0701 - 08/19 0700 In: 1279.4 [P.O.:360; I.V.:819.4; IV Piggyback:100] Out: 0630 [Urine:3050; Chest Tube:25]  Intake/Output this shift: No intake/output data recorded.  Current Meds: Scheduled Meds: . acetaminophen  1,000 mg Oral Q6H   Or  . acetaminophen (TYLENOL) oral liquid 160 mg/5 mL  1,000 mg Per Tube Q6H  . aspirin EC  325 mg Oral Daily   Or  . aspirin  324 mg Per Tube Daily  . bisacodyl  10 mg Oral Daily   Or  . bisacodyl  10 mg Rectal Daily  . chlorhexidine  15 mL Mouth Rinse BID  . Chlorhexidine Gluconate Cloth  6 each Topical Daily  . docusate sodium   200 mg Oral Daily  . enoxaparin (LOVENOX) injection  30 mg Subcutaneous QHS  . insulin aspart  0-24 Units Subcutaneous Q4H  . insulin detemir  20 Units Subcutaneous Daily  . insulin regular  0-10 Units Intravenous TID WC  . levothyroxine  50 mcg Oral QAC breakfast  . mouth rinse  15 mL Mouth Rinse q12n4p  . metoprolol tartrate  12.5 mg Oral BID   Or  . metoprolol tartrate  12.5 mg Per Tube BID  . mupirocin ointment  1 application Nasal BID  . pantoprazole  40 mg Oral Daily  . sodium chloride flush  10-40 mL Intracatheter Q12H  . sodium chloride flush  3 mL Intravenous Q12H  . warfarin  2.5 mg Oral q1800  . Warfarin - Physician Dosing Inpatient   Does not apply q1800   Continuous Infusions: . sodium chloride Stopped (11/30/16 0800)  . sodium chloride    . sodium chloride 1 mL/hr at 12/01/16 0000  . amiodarone 30 mg/hr (12/01/16 0505)  . cefUROXime (ZINACEF)  IV Stopped (12/01/16 0100)  . dexmedetomidine (PRECEDEX) IV infusion Stopped (11/30/16 0530)  . insulin (NOVOLIN-R) infusion 3 Units/hr (11/30/16 0700)  . lactated ringers 10 mL/hr at 12/01/16 0000  . lactated ringers    . nitroGLYCERIN  Stopped (11/29/16 1500)  . phenylephrine (NEO-SYNEPHRINE) Adult infusion Stopped (11/29/16 1810)   PRN Meds:.sodium chloride, metoprolol tartrate, midazolam, morphine injection, ondansetron (ZOFRAN) IV, oxyCODONE, sodium chloride flush, sodium chloride flush, traMADol  General appearance: alert and cooperative Neurologic: intact Heart: regular rate and rhythm, S1, S2 normal, no murmur, click, rub or gallop Lungs: diminished breath sounds bibasilar Abdomen: soft, non-tender; bowel sounds normal; no masses,  no organomegaly Extremities: extremities normal, atraumatic, no cyanosis or edema and Homans sign is negative, no sign of DVT Wound: intact  Lab Results: CBC: Recent Labs  11/30/16 1700 11/30/16 1715 12/01/16 0400  WBC 13.0*  --  13.1*  HGB 10.8* 11.2* 9.9*  HCT 33.2* 33.0* 30.6*    PLT 122*  --  127*   BMET:  Recent Labs  11/30/16 0430  11/30/16 1715 12/01/16 0400  NA 135  --  135 132*  K 4.8  --  4.2 4.5  CL 107  --  96* 97*  CO2 23  --   --  28  GLUCOSE 110*  --  132* 146*  BUN 17  --  17 14  CREATININE 0.85  < > 0.80 0.75  CALCIUM 7.4*  --   --  7.7*  < > = values in this interval not displayed.  CMET: Lab Results  Component Value Date   WBC 13.1 (H) 12/01/2016   HGB 9.9 (L) 12/01/2016   HCT 30.6 (L) 12/01/2016   PLT 127 (L) 12/01/2016   GLUCOSE 146 (H) 12/01/2016   ALT 36 11/25/2016   AST 26 11/25/2016   NA 132 (L) 12/01/2016   K 4.5 12/01/2016   CL 97 (L) 12/01/2016   CREATININE 0.75 12/01/2016   BUN 14 12/01/2016   CO2 28 12/01/2016   INR 1.26 12/01/2016   HGBA1C 6.3 (H) 11/25/2016      PT/INR:  Recent Labs  12/01/16 0400  LABPROT 15.9*  INR 1.26   Radiology: Dg Chest Port 1 View  Result Date: 12/01/2016 CLINICAL DATA:  Status post cardiac surgery. EXAM: PORTABLE CHEST 1 VIEW COMPARISON:  11/30/2016 FINDINGS: Since the prior exam, all lines and tubes have been removed with the exception of the right internal jugular introducer sheath. The cardiac silhouette is mildly enlarged.  No mediastinal widening. Lungs are clear. No convincing pleural effusion.  No pneumothorax. IMPRESSION: 1. No acute findings or evidence of an operative complication. Electronically Signed   By: Lajean Manes M.D.   On: 12/01/2016 07:25     Assessment/Plan: S/P Procedure(s) (LRB): AORTIC VALVE REPLACEMENT (AVR) using 23MM [employer] Valve (N/A) CORONARY ARTERY BYPASS GRAFTING (CABG) x one, using right leg greater saphenous vein harvested endoscopically (N/A) TRANSESOPHAGEAL ECHOCARDIOGRAM (TEE) (N/A) Mobilize Diuresis Convert to po amino today Started on coumadin last night , continue  D/c central line and foley    [name] 12/01/2016 8:39 AM

## 2016-12-01 NOTE — Progress Notes (Signed)
Patient ID: Michael Delgado, male   DOB: 1961-04-15, 56 y.o.   MRN: [id number] EVENING ROUNDS NOTE :     [address]       [place] [address]             [phone]                 2 Days Post-Op Procedure(s) (LRB): AORTIC VALVE REPLACEMENT (AVR) using 23MM [employer] Valve (N/A) CORONARY ARTERY BYPASS GRAFTING (CABG) x one, using right leg greater saphenous vein harvested endoscopically (N/A) TRANSESOPHAGEAL ECHOCARDIOGRAM (TEE) (N/A)  Total Length of Stay:  LOS: 2 days  BP 112/69   Pulse 77   Temp 99.6 F (37.6 C) (Oral)   Resp (!) 21   Ht 5' 9.5" (1.765 m)   Wt 276 lb 3.8 oz (125.3 kg)   SpO2 90%   BMI 40.21 kg/m   .Intake/Output      08/18 0701 - 08/19 0700 08/19 0701 - 08/20 0700   P.O. 360 720   I.V. (mL/kg) 819.4 (6.5)    IV Piggyback 100    Total Intake(mL/kg) 1279.4 (10.2) 720 (5.7)   Urine (mL/kg/hr) 3050 (1)    Chest Tube 25    Total Output 3075     Net -1795.7 +720          . sodium chloride Stopped (11/30/16 0800)  . sodium chloride    . sodium chloride 1 mL/hr at 12/01/16 0000  . dexmedetomidine (PRECEDEX) IV infusion Stopped (11/30/16 0530)  . insulin (NOVOLIN-R) infusion 3 Units/hr (11/30/16 0700)  . lactated ringers 10 mL/hr at 12/01/16 0000  . lactated ringers    . nitroGLYCERIN Stopped (11/29/16 1500)  . phenylephrine (NEO-SYNEPHRINE) Adult infusion Stopped (11/29/16 1810)     Lab Results  Component Value Date   WBC 13.1 (H) 12/01/2016   HGB 9.9 (L) 12/01/2016   HCT 30.6 (L) 12/01/2016   PLT 127 (L) 12/01/2016   GLUCOSE 146 (H) 12/01/2016   ALT 36 11/25/2016   AST 26 11/25/2016   NA 132 (L) 12/01/2016   K 4.5 12/01/2016   CL 97 (L) 12/01/2016   CREATININE 0.75 12/01/2016   BUN 14 12/01/2016   CO2 28 12/01/2016   INR 1.26 12/01/2016   HGBA1C 6.3 (H) 11/25/2016   Stable day Holding sinus  Coumadin started , dose yesterday and today   [name] MD  Beeper [phone] Office [phone] 12/01/2016 5:49  PM

## 2016-12-01 NOTE — Progress Notes (Signed)
  Amiodarone Drug - Drug Interaction Consult Note  Recommendations: Monitor HR; no action needed  Amiodarone is metabolized by the cytochrome P450 system and therefore has the potential to cause many drug interactions. Amiodarone has an average plasma half-life of 50 days (range 20 to 100 days).   There is potential for drug interactions to occur several weeks or months after stopping treatment and the onset of drug interactions may be slow after initiating amiodarone.   []  Statins: Increased risk of myopathy. Simvastatin- restrict dose to 20mg  daily. Other statins: counsel patients to report any muscle pain or weakness immediately.  []  Anticoagulants: Amiodarone can increase anticoagulant effect. Consider warfarin dose reduction. Patients should be monitored closely and the dose of anticoagulant altered accordingly, remembering that amiodarone levels take several weeks to stabilize.  []  Antiepileptics: Amiodarone can increase plasma concentration of phenytoin, the dose should be reduced. Note that small changes in phenytoin dose can result in large changes in levels. Monitor patient and counsel on signs of toxicity.  [x]  Beta blockers: increased risk of bradycardia, AV block and myocardial depression. Sotalol - avoid concomitant use.  []   Calcium channel blockers (diltiazem and verapamil): increased risk of bradycardia, AV block and myocardial depression.  []   Cyclosporine: Amiodarone increases levels of cyclosporine. Reduced dose of cyclosporine is recommended.  []  Digoxin dose should be halved when amiodarone is started.  []  Diuretics: increased risk of cardiotoxicity if hypokalemia occurs.  []  Oral hypoglycemic agents (glyburide, glipizide, glimepiride): increased risk of hypoglycemia. Patient's glucose levels should be monitored closely when initiating amiodarone therapy.   []  Drugs that prolong the QT interval:  Torsades de pointes risk may be increased with concurrent use - avoid  if possible.  Monitor QTc, also keep magnesium/potassium WNL if concurrent therapy can't be avoided. Marland Kitchen Antibiotics: e.g. fluoroquinolones, erythromycin. . Antiarrhythmics: e.g. quinidine, procainamide, disopyramide, sotalol. . Antipsychotics: e.g. phenothiazines, haloperidol.  . Lithium, tricyclic antidepressants, and methadone.  Thank You,  Pat Patrick  12/01/2016 2:34 PM

## 2016-12-02 ENCOUNTER — Encounter (HOSPITAL_COMMUNITY): Payer: Self-pay | Admitting: Thoracic Surgery (Cardiothoracic Vascular Surgery)

## 2016-12-02 ENCOUNTER — Inpatient Hospital Stay (HOSPITAL_COMMUNITY): Payer: Managed Care, Other (non HMO)

## 2016-12-02 LAB — GLUCOSE, CAPILLARY
GLUCOSE-CAPILLARY: 128 mg/dL — AB (ref 65–99)
GLUCOSE-CAPILLARY: 107 mg/dL — AB (ref 65–99)
Glucose-Capillary: 131 mg/dL — ABNORMAL HIGH (ref 65–99)
Glucose-Capillary: 107 mg/dL — ABNORMAL HIGH (ref 65–99)
Glucose-Capillary: 101 mg/dL — ABNORMAL HIGH (ref 65–99)
Glucose-Capillary: 96 mg/dL (ref 65–99)

## 2016-12-02 LAB — CBC
HCT: 29.8 % — ABNORMAL LOW (ref 39.0–52.0)
Hemoglobin: 9.6 g/dL — ABNORMAL LOW (ref 13.0–17.0)
MCH: 27.2 pg (ref 26.0–34.0)
MCHC: 32.2 g/dL (ref 30.0–36.0)
MCV: 84.4 fL (ref 78.0–100.0)
Platelets: 138 10*3/uL — ABNORMAL LOW (ref 150–400)
RBC: 3.53 MIL/uL — ABNORMAL LOW (ref 4.22–5.81)
RDW: 14.2 % (ref 11.5–15.5)
WBC: 11.2 10*3/uL — ABNORMAL HIGH (ref 4.0–10.5)

## 2016-12-02 LAB — BASIC METABOLIC PANEL
Anion gap: 6 (ref 5–15)
BUN: 16 mg/dL (ref 6–20)
CO2: 30 mmol/L (ref 22–32)
Calcium: 7.9 mg/dL — ABNORMAL LOW (ref 8.9–10.3)
Chloride: 99 mmol/L — ABNORMAL LOW (ref 101–111)
Creatinine, Ser: 0.88 mg/dL (ref 0.61–1.24)
GFR calc Af Amer: >60 mL/min (ref >60)
GFR calc non Af Amer: >60 mL/min (ref >60)
Glucose, Bld: 123 mg/dL — ABNORMAL HIGH (ref 65–99)
Potassium: 4.1 mmol/L (ref 3.5–5.1)
Sodium: 135 mmol/L (ref 135–145)

## 2016-12-02 LAB — HEPATIC FUNCTION PANEL
ALT: 31 U/L (ref 17–63)
AST: 25 U/L (ref 15–41)
Albumin: 3 g/dL — ABNORMAL LOW (ref 3.5–5.0)
Alkaline Phosphatase: 47 U/L (ref 38–126)
Bilirubin, Direct: 0.2 mg/dL (ref 0.1–0.5)
Indirect Bilirubin: 0.6 mg/dL (ref 0.3–0.9)
Total Bilirubin: 0.8 mg/dL (ref 0.3–1.2)
Total Protein: 5.7 g/dL — ABNORMAL LOW (ref 6.5–8.1)

## 2016-12-02 LAB — MAGNESIUM: Magnesium: 1.9 mg/dL (ref 1.7–2.4)

## 2016-12-02 LAB — TSH: TSH: 11.678 u[IU]/mL — ABNORMAL HIGH (ref 0.350–4.500)

## 2016-12-02 LAB — PROTIME-INR
INR: 1.22
Prothrombin Time: 15.5 seconds — ABNORMAL HIGH (ref 11.4–15.2)

## 2016-12-02 MED ORDER — SODIUM CHLORIDE 0.9% FLUSH: 3.0000 mL | Freq: Two times a day (BID) | INTRAVENOUS | Status: DC

## 2016-12-02 MED ORDER — INSULIN ASPART 100 UNIT/ML ~~LOC~~ SOLN: 0.0000 [IU] | Freq: Three times a day (TID) | SUBCUTANEOUS | Status: DC

## 2016-12-02 MED ORDER — METOPROLOL TARTRATE 25 MG/10 ML ORAL SUSPENSION: 25.0000 mg | Freq: Two times a day (BID) | ORAL | Status: DC

## 2016-12-02 MED ORDER — FUROSEMIDE 10 MG/ML IJ SOLN
40.0000 mg | Freq: Once | INTRAMUSCULAR | Status: AC
  Administered 2016-12-02: 40 mg via INTRAVENOUS
  Filled 2016-12-02: qty 4

## 2016-12-02 MED ORDER — SODIUM CHLORIDE 0.9% FLUSH: 3.0000 mL | INTRAVENOUS | Status: DC | PRN

## 2016-12-02 MED ORDER — POTASSIUM CHLORIDE CRYS ER 20 MEQ PO TBCR
20.0000 meq | EXTENDED_RELEASE_TABLET | Freq: Every day | ORAL | Status: DC
  Administered 2016-12-03 – 2016-12-06 (×4): 20 meq via ORAL
  Filled 2016-12-02 (×4): qty 1

## 2016-12-02 MED ORDER — SIMVASTATIN 20 MG PO TABS
20.0000 mg | ORAL_TABLET | Freq: Every day | ORAL | Status: DC
  Administered 2016-12-02 – 2016-12-05 (×4): 20 mg via ORAL
  Filled 2016-12-02 (×4): qty 1

## 2016-12-02 MED ORDER — FUROSEMIDE 40 MG PO TABS: 40.0000 mg | ORAL_TABLET | Freq: Every day | ORAL | Status: DC

## 2016-12-02 MED ORDER — FUROSEMIDE 40 MG PO TABS
40.0000 mg | ORAL_TABLET | Freq: Every day | ORAL | Status: DC
  Administered 2016-12-03 – 2016-12-06 (×4): 40 mg via ORAL
  Filled 2016-12-02 (×4): qty 1

## 2016-12-02 MED ORDER — ASPIRIN 81 MG PO CHEW: 81.0000 mg | CHEWABLE_TABLET | Freq: Every day | ORAL | Status: DC

## 2016-12-02 MED ORDER — SODIUM CHLORIDE 0.9% FLUSH
3.0000 mL | Freq: Two times a day (BID) | INTRAVENOUS | Status: DC
  Administered 2016-12-02 (×2): 3 mL via INTRAVENOUS

## 2016-12-02 MED ORDER — POTASSIUM CHLORIDE CRYS ER 20 MEQ PO TBCR: 20.0000 meq | EXTENDED_RELEASE_TABLET | Freq: Every day | ORAL | Status: DC

## 2016-12-02 MED ORDER — MOVING RIGHT ALONG BOOK
Freq: Once | Status: AC
  Administered 2016-12-02: 09:00:00
  Filled 2016-12-02: qty 1

## 2016-12-02 MED ORDER — LACTULOSE 10 GM/15ML PO SOLN
20.0000 g | Freq: Once | ORAL | Status: AC
  Administered 2016-12-02: 20 g via ORAL
  Filled 2016-12-02: qty 30

## 2016-12-02 MED ORDER — ASPIRIN EC 81 MG PO TBEC
81.0000 mg | DELAYED_RELEASE_TABLET | Freq: Every day | ORAL | Status: DC
  Administered 2016-12-02 – 2016-12-06 (×5): 81 mg via ORAL
  Filled 2016-12-02 (×5): qty 1

## 2016-12-02 MED ORDER — WARFARIN SODIUM 5 MG PO TABS
5.0000 mg | ORAL_TABLET | Freq: Every day | ORAL | Status: DC
  Administered 2016-12-02 – 2016-12-03 (×2): 5 mg via ORAL
  Filled 2016-12-02 (×2): qty 1

## 2016-12-02 MED ORDER — SODIUM CHLORIDE 0.9 % IV SOLN
250.0000 mL | INTRAVENOUS | Status: DC | PRN
Start: 2016-12-02 — End: 2016-12-03

## 2016-12-02 MED ORDER — POTASSIUM CHLORIDE CRYS ER 20 MEQ PO TBCR
20.0000 meq | EXTENDED_RELEASE_TABLET | Freq: Once | ORAL | Status: AC
  Administered 2016-12-02: 20 meq via ORAL
  Filled 2016-12-02: qty 1

## 2016-12-02 MED ORDER — ASPIRIN 81 MG PO CHEW: 324.0000 mg | CHEWABLE_TABLET | Freq: Every day | ORAL | Status: DC

## 2016-12-02 MED ORDER — METOCLOPRAMIDE HCL 10 MG PO TABS
10.0000 mg | ORAL_TABLET | Freq: Four times a day (QID) | ORAL | Status: AC
  Administered 2016-12-02 – 2016-12-03 (×4): 10 mg via ORAL
  Filled 2016-12-02 (×4): qty 1

## 2016-12-02 MED ORDER — MOVING RIGHT ALONG BOOK
Freq: Once | Status: AC
  Administered 2016-12-02: 17:00:00
  Filled 2016-12-02: qty 1

## 2016-12-02 MED ORDER — LEVOTHYROXINE SODIUM 100 MCG PO TABS
100.0000 ug | ORAL_TABLET | Freq: Every day | ORAL | Status: DC
  Administered 2016-12-03 – 2016-12-06 (×4): 100 ug via ORAL
  Filled 2016-12-02 (×4): qty 1

## 2016-12-02 MED ORDER — ASPIRIN EC 81 MG PO TBEC: 81.0000 mg | DELAYED_RELEASE_TABLET | Freq: Every day | ORAL | Status: DC

## 2016-12-02 MED ORDER — SODIUM CHLORIDE 0.9 % IV SOLN: 250.0000 mL | INTRAVENOUS | Status: DC | PRN

## 2016-12-02 MED ORDER — METOPROLOL TARTRATE 25 MG PO TABS
25.0000 mg | ORAL_TABLET | Freq: Two times a day (BID) | ORAL | Status: DC
  Administered 2016-12-02 – 2016-12-04 (×5): 25 mg via ORAL
  Filled 2016-12-02 (×5): qty 1

## 2016-12-02 NOTE — Care Management Note (Signed)
Case Management Note Marvetta Gibbons RN, BSN Unit 4E-Case Manager-- [place] coverage [phone]  Patient Details  Name: Michael Delgado MRN: [id number] Date of Birth: 05-01-1960  Subjective/Objective:      Pt admitted s/p AVR and CABGx1 on 8/17              Action/Plan: PTA pt lived at home with wife- anticipate return home- CM to follow for d/c needs  Expected Discharge Date:                  Expected Discharge Plan:  Home/Self Care  In-House Referral:     Discharge planning Services  CM Consult  Post Acute Care Choice:    Choice offered to:     DME Arranged:    DME Agency:     HH Arranged:    HH Agency:     Status of Service:  In process, will continue to follow  If discussed at Long Length of Stay Meetings, dates discussed:    Discharge Disposition:   Additional Comments:  Dawayne Patricia, RN 12/02/2016, 10:53 AM

## 2016-12-02 NOTE — Progress Notes (Addendum)
TCTS DAILY ICU PROGRESS NOTE                   [address]            [place] [address]          [phone]   3 Days Post-Op Procedure(s) (LRB): AORTIC VALVE REPLACEMENT (AVR) using 23MM [employer] Valve (N/A) CORONARY ARTERY BYPASS GRAFTING (CABG) x one, using right leg greater saphenous vein harvested endoscopically (N/A) TRANSESOPHAGEAL ECHOCARDIOGRAM (TEE) (N/A)  Total Length of Stay:  LOS: 3 days   Subjective: Patient feels full, constipated. He denies abdominal pain, nausea, or vomiting.  Objective: Vital signs in last 24 hours: Temp:  [98 F (36.7 C)-100.1 F (37.8 C)] 98 F (36.7 C) (08/20 0300) Pulse Rate:  [66-79] 72 (08/20 0700) Cardiac Rhythm: Normal sinus rhythm (08/20 0200) Resp:  [11-21] 14 (08/20 0700) BP: (91-138)/(44-84) 136/76 (08/20 0700) SpO2:  [90 %-96 %] 91 % (08/20 0700) Weight:  [124.4 kg (274 lb 4 oz)] 124.4 kg (274 lb 4 oz) (08/20 0300)  Filed Weights   11/30/16 0520 12/01/16 0615 12/02/16 0300  Weight: 127.6 kg (281 lb 4.9 oz) 125.3 kg (276 lb 3.8 oz) 124.4 kg (274 lb 4 oz)    Weight change: -0.9 kg (-1 lb 15.7 oz)      Intake/Output from previous day: 08/19 0701 - 08/20 0700 In: 1200 [P.O.:1200] Out: -   Intake/Output this shift: No intake/output data recorded.  Current Meds: Scheduled Meds: . acetaminophen  1,000 mg Oral Q6H   Or  . acetaminophen (TYLENOL) oral liquid 160 mg/5 mL  1,000 mg Per Tube Q6H  . amiodarone  400 mg Oral Q12H   Followed by  . [START ON 12/09/2016] amiodarone  400 mg Oral Daily  . aspirin EC  325 mg Oral Daily   Or  . aspirin  324 mg Per Tube Daily  . bisacodyl  10 mg Oral Daily   Or  . bisacodyl  10 mg Rectal Daily  . chlorhexidine  15 mL Mouth Rinse BID  . Chlorhexidine Gluconate Cloth  6 each Topical Daily  . docusate sodium  200 mg Oral Daily  . enoxaparin (LOVENOX) injection  30 mg Subcutaneous QHS  . insulin aspart  0-24 Units Subcutaneous Q4H  . insulin detemir  20  Units Subcutaneous Daily  . insulin regular  0-10 Units Intravenous TID WC  . levothyroxine  50 mcg Oral QAC breakfast  . mouth rinse  15 mL Mouth Rinse q12n4p  . metoprolol tartrate  12.5 mg Oral BID   Or  . metoprolol tartrate  12.5 mg Per Tube BID  . pantoprazole  40 mg Oral Daily  . sodium chloride flush  10-40 mL Intracatheter Q12H  . sodium chloride flush  3 mL Intravenous Q12H  . warfarin  2.5 mg Oral q1800  . Warfarin - Physician Dosing Inpatient   Does not apply q1800   Continuous Infusions: . sodium chloride Stopped (11/30/16 0800)  . sodium chloride    . sodium chloride 1 mL/hr at 12/01/16 0000  . dexmedetomidine (PRECEDEX) IV infusion Stopped (11/30/16 0530)  . insulin (NOVOLIN-R) infusion 3 Units/hr (11/30/16 0700)  . lactated ringers 10 mL/hr at 12/01/16 0000  . lactated ringers    . nitroGLYCERIN Stopped (11/29/16 1500)  . phenylephrine (NEO-SYNEPHRINE) Adult infusion Stopped (11/29/16 1810)   PRN Meds:.sodium chloride, metoprolol tartrate, midazolam, morphine injection, ondansetron (ZOFRAN) IV, oxyCODONE, sodium chloride flush, sodium chloride flush, traMADol  General appearance:  alert, cooperative and no distress Neurologic: intact Heart: RRR, sharp valve click Lungs: Slightly diminished at bases Abdomen: Soft, obese, non tender, sporadic bowel sounds Extremities: Mild LE edema Wound: Aquaecl removed and sternal wound is clean and dry  Lab Results: CBC: Recent Labs  12/01/16 0400 12/02/16 0436  WBC 13.1* 11.2*  HGB 9.9* 9.6*  HCT 30.6* 29.8*  PLT 127* 138*   BMET:  Recent Labs  12/01/16 0400 12/02/16 0436  NA 132* 135  K 4.5 4.1  CL 97* 99*  CO2 28 30  GLUCOSE 146* 123*  BUN 14 16  CREATININE 0.75 0.88  CALCIUM 7.7* 7.9*    CMET: Lab Results  Component Value Date   WBC 11.2 (H) 12/02/2016   HGB 9.6 (L) 12/02/2016   HCT 29.8 (L) 12/02/2016   PLT 138 (L) 12/02/2016   GLUCOSE 123 (H) 12/02/2016   ALT 31 12/02/2016   AST 25 12/02/2016     NA 135 12/02/2016   K 4.1 12/02/2016   CL 99 (L) 12/02/2016   CREATININE 0.88 12/02/2016   BUN 16 12/02/2016   CO2 30 12/02/2016   TSH 11.678 (H) 12/02/2016   INR 1.22 12/02/2016   HGBA1C 6.3 (H) 11/25/2016     PT/INR:  Recent Labs  12/02/16 0436  LABPROT 15.5*  INR 1.22   Radiology: No results found.   Assessment/Plan: S/P Procedure(s) (LRB): AORTIC VALVE REPLACEMENT (AVR) using 23MM [employer] Valve (N/A) CORONARY ARTERY BYPASS GRAFTING (CABG) x one, using right leg greater saphenous vein harvested endoscopically (N/A) TRANSESOPHAGEAL ECHOCARDIOGRAM (TEE) (N/A)  1. CV-Previous a fib. Maintaining SR in the 80's this am. On Amiodarone 400 mg bid, Lopressor 12.5 mg bid, and Couamdin. Has mechanical AVR. INR 1.22. Will increase Coumadin to 5 mg and monitor. Increase Lopressor to 25 mg bid and decrease ecasa to 81 mg. 2.Pulmonary-on 2 liters of oxygen via [place]. Will wean to room air as tolerates. CXR shows no pneumothorax, low lung volumes.Encourage incentive spirometer 3.Volume overload-Lasix 40 mg IV today 4.ABL anemia- H and H stable at 9.6 and 29.8 5. CBGs 129/102/131. Pre op HGA1C 6.3. He is likely pre diabetic. Will stop accu checks and SS upon transfer. He will need further medical follow up after discharge. 6. Thrombocytopenia-platelets up from 138,000 7. GI-LOC constipation, Reglan 8. Transfer to 4E  Arnoldo Lenis 12/02/2016 7:39 AM   I have seen and examined the patient and agree with the assessment and plan as outlined.  Mobilize.  Diuresis.  Change CBGs and SSI to ac/hs.  Increase metoprolol.  Increase Synthroid since TSH elevated.  Continue amiodarone for now.  Transfer step down.  Rexene Alberts, MD 12/02/2016 8:14 AM

## 2016-12-03 ENCOUNTER — Inpatient Hospital Stay (HOSPITAL_COMMUNITY): Payer: Managed Care, Other (non HMO)

## 2016-12-03 LAB — BASIC METABOLIC PANEL
ANION GAP: 6 (ref 5–15)
BUN: 18 mg/dL (ref 6–20)
CHLORIDE: 101 mmol/L (ref 101–111)
CO2: 28 mmol/L (ref 22–32)
Calcium: 8.2 mg/dL — ABNORMAL LOW (ref 8.9–10.3)
Creatinine, Ser: 0.91 mg/dL (ref 0.61–1.24)
GFR calc Af Amer: >60 mL/min (ref >60)
GLUCOSE: 118 mg/dL — AB (ref 65–99)
POTASSIUM: 4.1 mmol/L (ref 3.5–5.1)
Sodium: 135 mmol/L (ref 135–145)

## 2016-12-03 LAB — CBC
HEMATOCRIT: 28.6 % — AB (ref 39.0–52.0)
HEMOGLOBIN: 9.2 g/dL — AB (ref 13.0–17.0)
MCH: 26.9 pg (ref 26.0–34.0)
MCHC: 32.2 g/dL (ref 30.0–36.0)
MCV: 83.6 fL (ref 78.0–100.0)
Platelets: 207 10*3/uL (ref 150–400)
RBC: 3.42 MIL/uL — AB (ref 4.22–5.81)
RDW: 14.2 % (ref 11.5–15.5)
WBC: 11.2 10*3/uL — AB (ref 4.0–10.5)

## 2016-12-03 LAB — GLUCOSE, CAPILLARY: Glucose-Capillary: 108 mg/dL — ABNORMAL HIGH (ref 65–99)

## 2016-12-03 LAB — PROTIME-INR
INR: 1.27
Prothrombin Time: 16 seconds — ABNORMAL HIGH (ref 11.4–15.2)

## 2016-12-03 MED ORDER — SODIUM CHLORIDE 0.9% FLUSH
3.0000 mL | Freq: Two times a day (BID) | INTRAVENOUS | Status: DC
  Administered 2016-12-03 – 2016-12-05 (×4): 3 mL via INTRAVENOUS

## 2016-12-03 MED ORDER — SODIUM CHLORIDE 0.9% FLUSH: 3.0000 mL | INTRAVENOUS | Status: DC | PRN

## 2016-12-03 MED ORDER — WARFARIN VIDEO: Freq: Once | Status: DC

## 2016-12-03 MED ORDER — COUMADIN BOOK
Freq: Once | Status: AC
  Administered 2016-12-03: 12:00:00
  Filled 2016-12-03: qty 1

## 2016-12-03 NOTE — Discharge Summary (Signed)
Physician Discharge Summary       [address]       [place] [address]             [phone]    Patient ID: Michael Delgado MRN: [id number] DOB/AGE: July 13, 1960 56 y.o.  Admit date: 11/29/2016 Discharge date: 12/06/2016  Admission Diagnoses: 1. Severe aortic stenosis 2. Single vessel CAD  Active Diagnoses:  1. Essential hypertension 2. Obesity (BMI 30-39.9) 3. Hyperlipidemia 4. Blood dyscrasia-lupus anti coagulant 5. CHF (congestive heart failure) ([place]) 6. COPD (chronic obstructive pulmonary disease) ([place]) 7. GERD (gastroesophageal reflux disease) 8. Erectile dysfunction 9. Hx of Hodgkin's lymphoma 10. Cancer (HCC)-renal 11.Hypothyroidism 12. Tobacco abuse 13. History of bowel infarction 14. ABL anemia 15. PAF  Procedure (s):   Aortic Valve Replacement             [employer] top hat bileaflet mechanical valve (size 53mm, cat # [id number], serial # [id number])   Coronary Artery Bypass Grafting x 1             Reversed Greater Saphenous Vein Graft to Distal Right Coronary Artery Endoscopic Vein Harvest from Right Thigh by Dr. Roxy Manns on 11/29/2016  History of Presenting Illness: Patient is a 56 year old male with history of aortic stenosis, hypertension, hyperlipidemia, lupus anticoagulant positive on long-term warfarin anticoagulation, Hodgkin's lymphoma status post chemotherapy and mantle radiation therapy to the chest, hypothyroidism, GE reflux disease, and remote history of tobacco abuse who has been referred for surgical consultation to discuss treatment options for management of severe symptomatic aortic stenosis and coronary artery disease. The patient states that he was noted to have a heart murmur on physical exam many years ago by his primary care physician. He was referred for cardiology consultation and evaluated by Dr. Radford Pax in the past. Echocardiogram performed in 2008 revealed mild aortic stenosis. Despite encouragement from the patient's  primary care physician, patient did not return to see Dr. Radford Pax for follow up until recently. Over the past year the patient has developed progressive symptoms of exertional shortness of breath and chest discomfort. In May the patient underwent uncomplicated partial left nephrectomy for early-stage renal cell carcinoma. Since then the patient has experienced further increase in symptoms of exertional shortness of breath. He was seen in follow-up by Dr. Radford Pax and echocardiogram performed 10/07/2016 revealed severe aortic stenosis with normal left ventricular systolic function. Peak velocity across the aortic valve measured 4.3 m/s corresponding to mean transvalvular gradient estimated 41 mmHg. There was moderate aortic insufficiency. Ejection fraction was estimated 60-65%. Diagnostic cardiac catheterization was performed 10/23/2016 and notable for the presence of severe aortic stenosis and single-vessel coronary artery disease. Mean transvalvular gradient across the aortic valve was measured 44 mmHg corresponding to aortic valve area calculated 0.98 cm. Pulmonary artery pressures were mildly elevated. Coronary angiography revealed tubular 70% ostial stenosis of the right coronary artery, 80% proximal stenosis of an acute marginal branch, and 100% occlusion of the terminal torsion the right coronary artery beyond the posterior descending coronary artery and prior to a small posterolateral branch. Therewas no significant obstructive disease in the left coronary circulation. The patient was referred for elective surgical consultation.  The patient is married and lives with his wife in [place]. 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 His job requires long hours but does not require strenuous physical exertion. The patient has remained physically active and functionally independent all of his life. He admits that over the past year he  has slowed down considerably  with progressive symptoms of exertional shortness of breath and fatigue. He tires easily. He now gets short of breath with moderate activity, such as walking up a flight of stairs. This is limiting his physical activities considerably. He occasionally gets substernal chest tightness in association with shortness of breath. In the last few weeks he has had some difficulty lying flat in bed and occasional episodes of PND. He has a dry nonproductive cough. He has not had any prolonged episodes of chest pain or shortness of breath. He has not had any dizzy spells or syncope. The patient has been chronically anticoagulated using warfarin for more than 20 years. He originally presented with ischemic small bowel secondary to embolization requiring laparotomy for bowel resection. He was diagnosed with lupus anticoagulant hypercoagulable state. He has not had any problems or complications with long-term warfarin anticoagulation.  Patient returns to the office today for follow-up of severe symptomatic aortic stenosis and coronary artery disease stented plans to proceed with aortic valve replacement and coronary artery bypass grafting later this week. He was originally seen in consultation on 10/30/2016. Since then he traveled to [place] where his son got married. He returns to our office today and reports no new problems or complaints. He continues to experience exertional shortness of breath without chest discomfort. He states that symptoms have not progressed over the last few weeks. The remainder of his review of systems remains unchanged from previously.  Patient has stage D severe symptomatic aortic stenosis and single-vessel coronary artery disease. He presents with a gradual progression of symptoms of exertional shortness of breath, chest tightness, and fatigue consistent with chronic diastolic congestive heart failure and angina pectoris, New York Heart Association functional class III. I have personally  reviewed the patient's recent transthoracic echocardiogram and diagnostic cardiac catheterization. Echocardiogram confirms the presence of severe aortic stenosis. The aortic valve is trileaflet with severe thickening, restricted leaflet mobility, and moderate calcification involving all 3 leaflets. Peak velocity across the aortic valve measured 4.3 m/s corresponding to mean transvalvular gradient estimated 41 mmHg. Left ventricular systolic function appears normal. Diagnostic cardiac catheterization confirmed the presence of severe aortic stenosis with mean transvalvular gradient measured 44 mmHg at catheterization. Catheterization also reveals significant single-vessel coronary artery disease with tubular 70% ostial stenosis of the right coronary artery. There is no significant flow limiting disease in the left coronary system. Pulmonary artery pressures were mildly elevated. Dr. Roxy Manns agreed the patient would best be treated with aortic valve replacement and coronary artery bypass grafting. Potential risks, benefits, and complications of the surgery were discussed with the patient and he agreed to proceed with surgery. Pre operative carotid duplex US showed no significant right internal carotid artery stenosis and a 40-59% left internal carotid artery stenosis. Patient underwent a CABG x 1 and AVR on 10/30/2016.  Brief Hospital Course:  The patient was extubated the evening of surgery without difficulty. He remained afebrile and hemodynamically stable. He was initially A paced. Gordy Councilman, a line, chest tubes, and foley were removed early in the post operative course. Lopressor was started and titrated accordingly. He did go into a fib and was put on Amiodarone. He initially converted to sinus rhythm but then was mostly PAF. He was started on Coumadin for both a fib and a mechanical aortic valve. PT and INR were monitored daily. He will be discharged on 5 mg of Coumadin daily. His latest INR is up to 1.88. An  appointment has been made at [facility] for Monday 12/09/2016 to have his PT and INR checked. Patient states Dr. Rex Kras has been monitoring INR in the past, but patient agreeable to being monitored by cardiologist. He was volume over loaded and diuresed. He had ABL anemia. He did not require a post op transfusion. Last H and H was 9.2 and 28.6. He had thrombocytopenia but this did resolve as his last platelet count was up to 207,000. He was weaned off the insulin drip.  The patient's HGA1C pre op was 6.3. He is likely pre diabetic and will need further surveillance of HGA1C by his medical doctor. In addition, his TSH was 11.678 so Levothyroxine was increased to 100 mcg daily but again, he will need further follow up with his medical doctor. The patient was felt surgically stable for transfer from the ICU to PCTU for further convalescence on 12/02/2016. He continues to progress with cardiac rehab. He was ambulating on room air. He has been tolerating a diet and has had a bowel movement. Epicardial pacing wires were removed on 12/03/2016. Chest tube sutures will be removed the day of discharge. The patient is felt surgically stable for discharge today.    Latest Vital Signs: Blood pressure 113/69, pulse 74, temperature 98.5 F (36.9 C), temperature source Oral, resp. rate 19, height 5' 9.5" (1.765 m), weight 257 lb (116.6 kg), SpO2 95 %.  Physical Exam: Cardiovascular: IRRR, no murmur Pulmonary: Clear to auscultation bilaterally Abdomen: Soft, non tender, bowel sounds present. Extremities: Trace  bilateral lower extremity edema. Wounds: Clean and dry.  No erythema or signs of infection.  Discharge Condition:Stable and discharge to home.  Recent laboratory studies:  Lab Results  Component Value Date   WBC 11.2 (H) 12/03/2016   HGB 9.2 (L) 12/03/2016   HCT 28.6 (L) 12/03/2016   MCV 83.6 12/03/2016   PLT 207 12/03/2016   Lab Results  Component Value Date   NA 136 12/05/2016   K  4.7 12/05/2016   CL 100 (L) 12/05/2016   CO2 28 12/05/2016   CREATININE 0.91 12/05/2016   GLUCOSE 110 (H) 12/05/2016    Diagnostic Studies: Dg Chest 2 View  Result Date: 12/03/2016 CLINICAL DATA:  Prior CABG. EXAM: CHEST  2 VIEW COMPARISON:  12/02/2016. FINDINGS: Prior CABG and cardiac valve replacement. Cardiomegaly. Pulmonary vascularity is normal. Low lung volumes with mild bibasilar atelectasis. Small bilateral pleural effusions. No pneumothorax. IMPRESSION: 1. Prior CABG and cardiac valve replacement. Cardiomegaly. No pulmonary venous congestion. 2. Low lung volumes with mild basilar atelectasis. Small bilateral pleural effusions . Electronically Signed   By: Marcello Moores  Register   On: 12/03/2016 07:46   Ct Coronary Morph W/cta Cor Nancy Fetter W/ca W/cm &/or Wo/cm  Addendum Date: 11/19/2016   ADDENDUM REPORT: 11/19/2016 19:19 CLINICAL DATA:  56 year old male with severe aortic stenosis. EXAM: Cardiac TAVR CT TECHNIQUE: The patient was scanned on a [employer]. A 120 kV retrospective scan was triggered in the descending thoracic aorta at 111 HU's. Gantry rotation speed was 250 msecs and collimation was .6 mm. No beta blockade or nitro were given. The 3D data set was reconstructed in 5% intervals of the R-R cycle. Systolic and diastolic phases were analyzed on a dedicated work station using MPR, MIP and VRT modes. The patient received 80 cc of contrast. FINDINGS: Aortic Valve: Trileaflet, severely thickened and calcified with severely restricted leaflet opening. Moderate calcifications extending into the LVOT. Aorta:  Normal size, mild calcifications, no dissection. Sinotubular Junction:  30 x 28 mm Ascending Thoracic  Aorta:  35 x 34 mm Aortic Arch:  30 x 26 mm Descending Thoracic Aorta:  25 x 25 mm Sinus of Valsalva Measurements: Non-coronary:  33 mm Right -coronary:  33 mm Left -coronary:  34 mm Coronary Artery Height above Annulus: Left Main:  14 mm Right Coronary:  13 mm Virtual Basal Annulus  Measurements: Maximum/Minimum Diameter:  28 x 21 mm Perimeter:  78 mm Area:  460 mm2 IMPRESSION: 1. Trileaflet, severely thickened and calcified aortic valve with severely restricted leaflet opening. Moderate calcifications extending into the LVOT. 2. Normal size of the thoracic aorta with mild calcifications and no dissection. 3. Moderate circumferential mitral annular calcifications. 4. No thrombus in the left atrial appendage. 5. No ASD/VSD. 6. Mildly dilated pulmonary artery measuring 30 mm. Ena Dawley Electronically Signed   By: Ena Dawley   On: 11/19/2016 19:19   Result Date: 11/19/2016 EXAM: OVER-READ INTERPRETATION  CT CHEST The following report is an over-read performed by radiologist Dr. Forest Gleason [facility] on 11/18/2016. This over-read does not include interpretation of cardiac or coronary anatomy or pathology. The coronary CTA interpretation by the cardiologist is attached. COMPARISON:  Chest radiograph 12/14/2015.  Chest CT 08/09/2014. FINDINGS: vascular: Aortic and branch vessel atherosclerosis. No central pulmonary embolism, on this non-dedicated study. Pulmonary artery enlargement, including a 3.2 cm right pulmonary artery. No central pulmonary embolism, on this non-dedicated study. Mediastinum/Nodes: No mediastinal or hilar adenopathy. Lungs/Pleura: No pleural fluid. Minimal subpleural nodularity along the right minor fissure on image 44/series 10 is similar back to 2016 and can be presumed benign. Subsegmental atelectasis or scarring involves the right lung base. Upper Abdomen: Hepatic steatosis. Normal imaged portions of the spleen, stomach. Musculoskeletal: No acute osseous abnormality. Suspect a hemangioma within the midthoracic spine, similar. IMPRESSION: 1. No acute extracardiac findings within the imaged chest. 2. Pulmonary artery enlargement suggests pulmonary arterial hypertension. 3. Hepatic steatosis. Electronically Signed: By: Abigail Miyamoto M.D. On: 11/18/2016  10:58  Discharge Instructions    Amb Referral to Cardiac Rehabilitation    Complete by:  As directed    Referring to [place] CRP 2   Diagnosis:   CABG Valve Replacement     Valve:  Aortic   CABG X ___:  1     Discharge Medications: Allergies as of 12/06/2016   No Known Allergies     Medication List    STOP taking these medications   benzonatate 100 MG capsule Commonly known as:  TESSALON   Potassium 99 MG Tabs   quinapril 20 MG tablet Commonly known as:  ACCUPRIL     TAKE these medications   amiodarone 200 MG tablet Commonly known as:  PACERONE Take 1 tablet (200 mg total) by mouth 2 (two) times daily. take for 2 weeks then take 200 mg by mouth daily thereafter   aspirin 81 MG EC tablet Take 1 tablet (81 mg total) by mouth daily.   budesonide-formoterol 80-4.5 MCG/ACT inhaler Commonly known as:  SYMBICORT Inhale 1 puff into the lungs at bedtime as needed (shortness of breath).   diphenhydramine-acetaminophen 25-500 MG Tabs tablet Commonly known as:  TYLENOL PM Take 1 tablet by mouth at bedtime.   FISH OIL PO Take 3 capsules by mouth at bedtime. 1200/360 MG   levothyroxine 100 MCG tablet Commonly known as:  SYNTHROID, LEVOTHROID Take 1 tablet (100 mcg total) by mouth daily before breakfast. What changed:  medication strength  how much to take   loratadine 10 MG tablet Commonly known as:  CLARITIN Take 10 mg by mouth daily as needed for allergies.   metoprolol tartrate 50 MG tablet Commonly known as:  LOPRESSOR Take 1 tablet (50 mg total) by mouth 2 (two) times daily. What changed:  medication strength  how much to take   oxyCODONE 5 MG immediate release tablet Commonly known as:  Oxy IR/ROXICODONE Take 5 mg by mouth every 4-6 hours PRN severe pain.   simvastatin 40 MG tablet Commonly known as:  ZOCOR Take 40 mg by mouth every evening.   warfarin 5 MG tablet Commonly known as:  COUMADIN Take 1 tablet (5 mg total) by mouth daily at 6 PM.  Or as directed What changed:  when to take this  additional instructions            Discharge Care Instructions        Start     Ordered   12/06/16 0000  amiodarone (PACERONE) 200 MG tablet  2 times daily    Question:  Supervising Provider  Answer:  Rexene Alberts   12/06/16 0751   12/06/16 0000  metoprolol tartrate (LOPRESSOR) 50 MG tablet  2 times daily    Question:  Supervising Provider  Answer:  Rexene Alberts   12/06/16 0751   12/06/16 0000  warfarin (COUMADIN) 5 MG tablet  Daily-1800    Question:  Supervising Provider  Answer:  Rexene Alberts   12/06/16 0754   12/06/16 0000  levothyroxine (SYNTHROID, LEVOTHROID) 100 MCG tablet  Daily before breakfast    Question:  Supervising Provider  Answer:  Rexene Alberts   12/06/16 0756   12/05/16 0000  Amb Referral to Cardiac Rehabilitation    Comments:  Referring to [place] CRP 2  Question Answer Comment  Diagnosis: CABG   Diagnosis: Valve Replacement   Valve: Aortic   CABG X ___ 1      12/05/16 1010   12/04/16 0000  aspirin EC 81 MG EC tablet  Daily     12/04/16 0722   12/04/16 0000  oxyCODONE (OXY IR/ROXICODONE) 5 MG immediate release tablet    Question:  Supervising Provider  Answer:  Rexene Alberts   12/04/16 1324     The patient has been discharged on:   1.Beta Blocker:  Yes [ x  ]                              No   [   ]                              If No, reason:  2.Ace Inhibitor/ARB: Yes [   ]                                     No  [ x   ]                                     If No, reason:Labile BP but will try to restart Quinapril after discharge  3.Statin:   Yes [  x ]                  No  [   ]  If No, reason:  4.Ecasa:  Yes  [  x ]                  No   [   ]                  If No, reason:  Follow Up Appointments: Follow-up Information    Rexene Alberts, MD. Go on 01/06/2017.   Specialty:  Cardiothoracic Surgery Why:  PA/LAT CXR to be taken (at [facility] which is in the same building as Dr. Guy Sandifer office) on 01/06/2017 a t3:30 pm;Appointment time is at 4:00 pm Contact information: [place] [address] [phone]        [name], MD. Call.   Specialty:  Family Medicine Why:  Call for a follow up appointment regarding HGA1C 6.3 (pre diabetes) and surveillance of TSH as was 11.678  Contact information: [place] [address] [phone]        Imogene Burn, PA-C. Go on 12/24/2016.   Specialty:  Cardiology Why:  Appointment time is at 1:45 pm Contact information: [address] [place] [address] [phone]        [facility] Follow up on 12/09/2016.   Why:  Appointment time is at 2:30 pm and is for PT/INR to be checked (on Coumadin for PAF, mechanical AVR). Contact information: [address] [place], [place] [address] [phone])           Signed: Lars Pinks MPA-C 12/06/2016, 8:44 AM

## 2016-12-03 NOTE — Progress Notes (Signed)
CARDIAC REHAB PHASE I   PRE:  Rate/Rhythm: 76 SR  BP:  Supine: 128/84  Sitting:   Standing:    SaO2: 93%RA  MODE:  Ambulation: 470 ft   POST:  Rate/Rhythm: 83 SR  BP:  Supine: 143/78  Sitting:   Standing:    SaO2: 95%RA 1255-1314 Pt walked 470 ft with rolling walker with steady gait. Tolerated well. To bed after walk. Has walked twice today. Knows to walk again later.   Graylon Good, RN BSN  12/03/2016 1:09 PM

## 2016-12-03 NOTE — Discharge Instructions (Signed)
Coronary Artery Bypass Grafting, Care After This sheet gives you information about how to care for yourself after your procedure. Your health care provider may also give you more specific instructions. If you have problems or questions, contact your health care provider. What can I expect after the procedure? After the procedure, it is common to have:  Nausea and a lack of appetite.  Constipation.  Weakness and fatigue.  Depression or irritability.  Pain or discomfort in your incision areas.  Follow these instructions at home: Medicines  Take over-the-counter and prescription medicines only as told by your health care provider. Do not stop taking medicines or start any new medicines without approval from your health care provider.  If you were prescribed an antibiotic medicine, take it as told by your health care provider. Do not stop taking the antibiotic even if you start to feel better.  Do not drive or use heavy machinery while taking prescription pain medicine. Incision care  Follow instructions from your health care provider about how to take care of your incisions. Make sure you: ? Wash your hands with soap and water before you change your bandage (dressing). If soap and water are not available, use hand sanitizer. ? Change your dressing as told by your health care provider. ? Leave stitches (sutures), skin glue, or adhesive strips in place. These skin closures may need to stay in place for 2 weeks or longer. If adhesive strip edges start to loosen and curl up, you may trim the loose edges. Do not remove adhesive strips completely unless your health care provider tells you to do that.  Keep incision areas clean, dry, and protected.  Check your incision areas every day for signs of infection. Check for: ? More redness, swelling, or pain. ? More fluid or blood. ? Warmth. ? Pus or a bad smell.  If incisions were made in your legs: ? Avoid crossing your legs. ? Avoid  sitting for long periods of time. Change positions every 30 minutes. ? Raise (elevate) your legs when you are sitting. Bathing  Do not take baths, swim, or use a hot tub until your health care provider approves.  Only take sponge baths. Pat the incisions dry. Do not rub incisions with a washcloth or towel.  Ask your health care provider when you can shower. Eating and drinking  Eat foods that are high in fiber, such as raw fruits and vegetables, whole grains, beans, and nuts. Meats should be lean cut. Avoid canned, processed, and fried foods. This can help prevent constipation and is a recommended part of a heart-healthy diet.  Drink enough fluid to keep your urine clear or pale yellow.  Limit alcohol intake to no more than 1 drink a day for nonpregnant women and 2 drinks a day for men. One drink equals 12 oz of beer, 5 oz of wine, or 1 oz of hard liquor. Activity  Rest and limit your activity as told by your health care provider. You may be instructed to: ? Stop any activity right away if you have chest pain, shortness of breath, irregular heartbeats, or dizziness. Get help right away if you have any of these symptoms. ? Move around frequently for short periods or take short walks as directed by your health care provider. Gradually increase your activities. You may need physical therapy or cardiac rehabilitation to help strengthen your muscles and build your endurance. ? Avoid lifting, pushing, or pulling anything that is heavier than 10 lb (4.5 kg) for at  least 6 weeks or as told by your health care provider. °· Do not drive until your health care provider approves. °· Ask your health care provider when you may return to work. °· Ask your health care provider when you may resume sexual activity. °General instructions °· Do not use any products that contain nicotine or tobacco, such as cigarettes and e-cigarettes. If you need help quitting, ask your health care provider. °· Take 2-3 deep  breaths every few hours during the day, while you recover. This helps expand your lungs and prevent complications like pneumonia after surgery. °· If you were given a device called an incentive spirometer, use it several times a day to practice deep breathing. Support your chest with a pillow or your arms when you take deep breaths or cough. °· Wear compression stockings as told by your health care provider. These stockings help to prevent blood clots and reduce swelling in your legs. °· Weigh yourself every day. This helps identify if your body is holding (retaining) fluid that may make your heart and lungs work harder. °· Keep all follow-up visits as told by your health care provider. This is important. °Contact a health care provider if: °· You have more redness, swelling, or pain around any incision. °· You have more fluid or blood coming from any incision. °· Any incision feels warm to the touch. °· You have pus or a bad smell coming from any incision °· You have a fever. °· You have swelling in your ankles or legs. °· You have pain in your legs. °· You gain 2 lb (0.9 kg) or more a day. °· You are nauseous or you vomit. °· You have diarrhea. °Get help right away if: °· You have chest pain that spreads to your jaw or arms. °· You are short of breath. °· You have a fast or irregular heartbeat. °· You notice a "clicking" in your breastbone (sternum) when you move. °· You have numbness or weakness in your arms or legs. °· You feel dizzy or light-headed. °Summary °· After the procedure, it is common to have pain or discomfort in the incision areas. °· Do not take baths, swim, or use a hot tub until your health care provider approves. °· Gradually increase your activities. You may need physical therapy or cardiac rehabilitation to help strengthen your muscles and build your endurance. °· Weigh yourself every day. This helps identify if your body is holding (retaining) fluid that may make your heart and lungs work  harder. °This information is not intended to replace advice given to you by your health care provider. Make sure you discuss any questions you have with your health care provider. °Document Released: 10/19/2004 Document Revised: 02/19/2016 Document Reviewed: 02/19/2016 °Elsevier Interactive Patient Education © 2018 [employer] ° °Aortic Valve Replacement, Care After °Refer to this sheet in the next few weeks. These instructions provide you with information about caring for yourself after your procedure. Your health care provider may also give you more specific instructions. Your treatment has been planned according to current medical practices, but problems sometimes occur. Call your health care provider if you have any problems or questions after your procedure. °What can I expect after the procedure? °After the procedure, it is common to have: °· Pain around your incision area. °· A small amount of blood or clear fluid coming from your incision. ° °Follow these instructions at home: °Eating and drinking ° °· Follow instructions from your health care provider about eating   or drinking restrictions. °? Limit alcohol intake to no more than 1 drink per day for nonpregnant women and 2 drinks per day for men. One drink equals 12 oz of beer, 5 oz of wine, or 1½ oz of hard liquor. °? Limit how much caffeine you drink. Caffeine can affect your heart's rate and rhythm. °· Drink enough fluid to keep your urine clear or pale yellow. °· Eat a heart-healthy diet. This should include plenty of fresh fruits and vegetables. If you eat meat, it should be lean cuts. Avoid foods that are: °? High in salt, saturated fat, or sugar. °? Canned or highly processed. °? Fried. °Activity °· Return to your normal activities as told by your health care provider. Ask your health care provider what activities are safe for you. °· Exercise regularly once you have recovered, as told by your health care provider. °· Avoid sitting for more than 2  hours at a time without moving. Get up and move around at least once every 1-2 hours. This helps to prevent blood clots in the legs. °· Do not lift anything that is heavier than 10 lb (4.5 kg) until your health care provider approves. °· Avoid pushing or pulling things with your arms until your health care provider approves. This includes pulling on handrails to help you climb stairs. °Incision care ° °· Follow instructions from your health care provider about how to take care of your incision. Make sure you: °? Wash your hands with soap and water before you change your bandage (dressing). If soap and water are not available, use hand sanitizer. °? Change your dressing as told by your health care provider. °? Leave stitches (sutures), skin glue, or adhesive strips in place. These skin closures may need to stay in place for 2 weeks or longer. If adhesive strip edges start to loosen and curl up, you may trim the loose edges. Do not remove adhesive strips completely unless your health care provider tells you to do that. °· Check your incision area every day for signs of infection. Check for: °? More redness, swelling, or pain. °? More fluid or blood. °? Warmth. °? Pus or a bad smell. °Medicines °· Take over-the-counter and prescription medicines only as told by your health care provider. °· If you were prescribed an antibiotic medicine, take it as told by your health care provider. Do not stop taking the antibiotic even if you start to feel better. °Travel °· Avoid airplane travel for as long as told by your health care provider. °· When you travel, bring a list of your medicines and a record of your medical history with you. Carry your medicines with you. °Driving °· Ask your health care provider when it is safe for you to drive. Do not drive until your health care provider approves. °· Do not drive or operate heavy machinery while taking prescription pain medicine. °Lifestyle ° °· Do not use any tobacco products,  such as cigarettes, chewing tobacco, or e-cigarettes. If you need help quitting, ask your health care provider. °· Resume sexual activity as told by your health care provider. Do not use medicines for erectile dysfunction unless your health care provider approves, if this applies. °· Work with your health care provider to keep your blood pressure and cholesterol under control, and to manage any other heart conditions that you have. °· Maintain a healthy weight. °General instructions °· Do not take baths, swim, or use a hot tub until your health care provider approves. °·   Do not strain to have a bowel movement.  Avoid crossing your legs while sitting down.  Check your temperature every day for a fever. A fever may be a sign of infection.  If you are a woman and you plan to become pregnant, talk with your health care provider before you become pregnant.  Wear compression stockings if your health care provider instructs you to do this. These stockings help to prevent blood clots and reduce swelling in your legs.  Tell all health care providers who care for you that you have an artificial (prosthetic) aortic valve. If you have or have had heart disease or endocarditis, tell all health care providers about these conditions as well.  Keep all follow-up visits as told by your health care provider. This is important. Contact a health care provider if:  You develop a skin rash.  You experience sudden, unexplained changes in your weight.  You have more redness, swelling, or pain around your incision.  You have more fluid or blood coming from your incision.  Your incision feels warm to the touch.  You have pus or a bad smell coming from your incision.  You have a fever. Get help right away if:  You develop chest pain that is different from the pain coming from your incision.  You develop shortness of breath or difficulty breathing.  You start to feel light-headed. These symptoms may represent  a serious problem that is an emergency. Do not wait to see if the symptoms will go away. Get medical help right away. Call your local emergency services (911 in the [place] Do not drive yourself to the hospital. This information is not intended to replace advice given to you by your health care provider. Make sure you discuss any questions you have with your health care provider. Document Released: 10/18/2004 Document Revised: 09/07/2015 Document Reviewed: 03/05/2015 Elsevier Interactive Patient Education  2017 [place] on my medicine - Coumadin   (Warfarin)  This medication education was reviewed with me or my healthcare representative as part of my discharge preparation.  The pharmacist that spoke with me during my hospital stay was:  Wayland Salinas, [facility]  Why was Coumadin prescribed for you? Coumadin was prescribed for you because you have a blood clot or a medical condition that can cause an increased risk of forming blood clots. Blood clots can cause serious health problems by blocking the flow of blood to the heart, lung, or brain. Coumadin can prevent harmful blood clots from forming. As a reminder your indication for Coumadin is:   Blood Clot Prevention After Heart Valve Surgery  What test will check on my response to Coumadin? While on Coumadin (warfarin) you will need to have an INR test regularly to ensure that your dose is keeping you in the desired range. The INR (international normalized ratio) number is calculated from the result of the laboratory test called prothrombin time (PT).  If an INR APPOINTMENT HAS NOT ALREADY BEEN MADE FOR YOU please schedule an appointment to have this lab work done by your health care provider within 7 days. Your INR goal is usually a number between:  2 to 3 or your provider may give you a more narrow range like 2-2.5.  Ask your health care provider during an office visit what your goal INR is.  What  do you need  to  know  About  COUMADIN? Take Coumadin (warfarin) exactly as prescribed by your healthcare provider about  the same time each day.  DO NOT stop taking without talking to the doctor who prescribed the medication.  Stopping without other blood clot prevention medication to take the place of Coumadin may increase your risk of developing a new clot or stroke.  Get refills before you run out.  What do you do if you miss a dose? If you miss a dose, take it as soon as you remember on the same day then continue your regularly scheduled regimen the next day.  Do not take two doses of Coumadin at the same time.  Important Safety Information A possible side effect of Coumadin (Warfarin) is an increased risk of bleeding. You should call your healthcare provider right away if you experience any of the following: ? Bleeding from an injury or your nose that does not stop. ? Unusual colored urine (red or dark brown) or unusual colored stools (red or black). ? Unusual bruising for unknown reasons. ? A serious fall or if you hit your head (even if there is no bleeding).  Some foods or medicines interact with Coumadin (warfarin) and might alter your response to warfarin. To help avoid this: ? Eat a balanced diet, maintaining a consistent amount of Vitamin K. ? Notify your provider about major diet changes you plan to make. ? Avoid alcohol or limit your intake to 1 drink for women and 2 drinks for men per day. (1 drink is 5 oz. wine, 12 oz. beer, or 1.5 oz. liquor.)  Make sure that ANY health care provider who prescribes medication for you knows that you are taking Coumadin (warfarin).  Also make sure the healthcare provider who is monitoring your Coumadin knows when you have started a new medication including herbals and non-prescription products.  Coumadin (Warfarin)  Major Drug Interactions  Increased Warfarin Effect Decreased Warfarin Effect  Alcohol (large quantities) Antibiotics (esp. Septra/Bactrim,  Flagyl, Cipro) Amiodarone (Cordarone) Aspirin (ASA) Cimetidine (Tagamet) Megestrol (Megace) NSAIDs (ibuprofen, naproxen, etc.) Piroxicam (Feldene) Propafenone (Rythmol SR) Propranolol (Inderal) Isoniazid (INH) Posaconazole (Noxafil) Barbiturates (Phenobarbital) Carbamazepine (Tegretol) Chlordiazepoxide (Librium) Cholestyramine (Questran) Griseofulvin Oral Contraceptives Rifampin Sucralfate (Carafate) Vitamin K   Coumadin (Warfarin) Major Herbal Interactions  Increased Warfarin Effect Decreased Warfarin Effect  Garlic Ginseng Ginkgo biloba Coenzyme Q10 Green tea St. Johns wort    Coumadin (Warfarin) FOOD Interactions  Eat a consistent number of servings per week of foods HIGH in Vitamin K (1 serving =  cup)  Collards (cooked, or boiled & drained) Kale (cooked, or boiled & drained) Mustard greens (cooked, or boiled & drained) Parsley *serving size only =  cup Spinach (cooked, or boiled & drained) Swiss chard (cooked, or boiled & drained) Turnip greens (cooked, or boiled & drained)  Eat a consistent number of servings per week of foods MEDIUM-HIGH in Vitamin K (1 serving = 1 cup)  Asparagus (cooked, or boiled & drained) Broccoli (cooked, boiled & drained, or raw & chopped) Brussel sprouts (cooked, or boiled & drained) *serving size only =  cup Lettuce, raw (green leaf, endive, romaine) Spinach, raw Turnip greens, raw & chopped   These websites have more information on Coumadin (warfarin):  Fail[employer].se; VeganReport.com.au;

## 2016-12-03 NOTE — Progress Notes (Addendum)
      [address]       [place] [address]             [phone]        4 Days Post-Op Procedure(s) (LRB): AORTIC VALVE REPLACEMENT (AVR) using 23MM [employer] Valve (N/A) CORONARY ARTERY BYPASS GRAFTING (CABG) x one, using right leg greater saphenous vein harvested endoscopically (N/A) TRANSESOPHAGEAL ECHOCARDIOGRAM (TEE) (N/A)  Subjective: Patient states food gets stuck in esophagus sometimes-he states he had radiation in the past and this is nothing new for him. He did have a bowel movement.  Objective: Vital signs in last 24 hours: Temp:  [98.6 F (37 C)-99.8 F (37.7 C)] 98.9 F (37.2 C) (08/21 0509) Pulse Rate:  [41-78] 41 (08/21 0509) Cardiac Rhythm: Normal sinus rhythm;Atrial fibrillation (08/21 0424) Resp:  [13-23] 23 (08/21 0509) BP: (102-165)/(54-90) 117/62 (08/21 0509) SpO2:  [94 %-96 %] 94 % (08/21 0509) Weight:  [120.8 kg (266 lb 6.4 oz)] 120.8 kg (266 lb 6.4 oz) (08/21 0243)  Pre op weight 121.1 kg Current Weight  12/03/16 120.8 kg (266 lb 6.4 oz)   Intake/Output from previous day: 08/20 0701 - 08/21 0700 In: 200 [P.O.:200] Out: 1750 [Urine:1750]   Physical Exam:  Cardiovascular: IRRR, no murmur Pulmonary: Clear to auscultation bilaterally Abdomen: Soft, non tender, bowel sounds present. Extremities: Trace  bilateral lower extremity edema. Wounds: Clean and dry.  No erythema or signs of infection.  Lab Results: CBC: Recent Labs  12/02/16 0436 12/03/16 0414  WBC 11.2* 11.2*  HGB 9.6* 9.2*  HCT 29.8* 28.6*  PLT 138* 207   BMET:  Recent Labs  12/02/16 0436 12/03/16 0414  NA 135 135  K 4.1 4.1  CL 99* 101  CO2 30 28  GLUCOSE 123* 118*  BUN 16 18  CREATININE 0.88 0.91  CALCIUM 7.9* 8.2*    PT/INR:  Lab Results  Component Value Date   INR 1.27 12/03/2016   INR 1.22 12/02/2016   INR 1.26 12/01/2016   ABG:  INR: Will add last result for INR, ABG once components are confirmed Will add last 4 CBG results  once components are confirmed  Assessment/Plan:  1. CV-Previous a fib. PAF last 24 hours. On Amiodarone 400 mg bid, Lopressor 25 mg bid, and Couamdin. Has mechanical AVR. INR with slight increase to 1.27. Will continue with Coumadin 5 mg as will not see that result until am. 2. Pulmonary-on room air. CXR appears to show no pneumothorax and small pleural effusions. Encourage incentive spirometer 3.Volume overload-continue with Lasix 40 mg orally daily 4.ABL anemia- H and H stable at 9.2 and 28.6.  5. Thrombocytopenia resolved -platelets up to 207,000 6. TSH yesterday 11.678 so Levothyroxine increased to 100 mcg daily. He will need to follow up with his medical doctor after discharge. 7. Remove EPW 8. GI-change diet to soft diet 9. Possible discharge in 1-2 days  ZIMMERMAN,DONIELLE MPA-C 12/03/2016,7:14 AM   I have seen and examined the patient and agree with the assessment and plan as outlined.  No further episodes Afib.  Will stop amiodarone.  Continue Coumadin 5 mg/day.  Rexene Alberts, MD 12/03/2016 10:05 AM

## 2016-12-04 ENCOUNTER — Inpatient Hospital Stay (HOSPITAL_COMMUNITY): Payer: Managed Care, Other (non HMO)

## 2016-12-04 LAB — GLUCOSE, CAPILLARY: Glucose-Capillary: 119 mg/dL — ABNORMAL HIGH (ref 65–99)

## 2016-12-04 LAB — PROTIME-INR
INR: 1.27
Prothrombin Time: 16 seconds — ABNORMAL HIGH (ref 11.4–15.2)

## 2016-12-04 MED ORDER — WARFARIN SODIUM 5 MG PO TABS: 5.0000 mg | ORAL_TABLET | Freq: Every evening | ORAL | 1 refills | Status: DC

## 2016-12-04 MED ORDER — OXYCODONE HCL 5 MG PO TABS: ORAL_TABLET | ORAL | 0 refills | Status: DC

## 2016-12-04 MED ORDER — METOPROLOL TARTRATE 50 MG PO TABS
50.0000 mg | ORAL_TABLET | Freq: Two times a day (BID) | ORAL | Status: DC
  Administered 2016-12-04 – 2016-12-06 (×4): 50 mg via ORAL
  Filled 2016-12-04 (×4): qty 1

## 2016-12-04 MED ORDER — LEVOTHYROXINE SODIUM 100 MCG PO TABS: 100.0000 ug | ORAL_TABLET | Freq: Every day | ORAL | 1 refills | Status: DC

## 2016-12-04 MED ORDER — WARFARIN SODIUM 7.5 MG PO TABS
7.5000 mg | ORAL_TABLET | Freq: Every day | ORAL | Status: DC
  Administered 2016-12-04 – 2016-12-05 (×2): 7.5 mg via ORAL
  Filled 2016-12-04 (×2): qty 1

## 2016-12-04 MED ORDER — ASPIRIN 81 MG PO TBEC: 81.0000 mg | DELAYED_RELEASE_TABLET | Freq: Every day | ORAL | Status: DC

## 2016-12-04 MED ORDER — METOPROLOL TARTRATE 25 MG PO TABS: 25.0000 mg | ORAL_TABLET | Freq: Two times a day (BID) | ORAL | 1 refills | Status: DC

## 2016-12-04 MED ORDER — AMIODARONE HCL 200 MG PO TABS
400.0000 mg | ORAL_TABLET | Freq: Two times a day (BID) | ORAL | Status: DC
  Administered 2016-12-04 – 2016-12-06 (×5): 400 mg via ORAL
  Filled 2016-12-04 (×5): qty 2

## 2016-12-04 NOTE — Progress Notes (Addendum)
      [address]       [place] [address]             [phone]        5 Days Post-Op Procedure(s) (LRB): AORTIC VALVE REPLACEMENT (AVR) using 23MM Carbonmed[employer] Valve (N/A) CORONARY ARTERY BYPASS GRAFTING (CABG) x one, using right leg greater saphenous vein harvested endoscopically (N/A) TRANSESOPHAGEAL ECHOCARDIOGRAM (TEE) (N/A)  Subjective: Patient without complaints this am.  Objective: Vital signs in last 24 hours: Temp:  [98.2 F (36.8 C)-99.2 F (37.3 C)] 99.2 F (37.3 C) (08/22 0002) Pulse Rate:  [72-84] 72 (08/22 0002) Cardiac Rhythm: Heart block (08/21 2000) Resp:  [18-25] 18 (08/22 0555) BP: (109-146)/(58-84) 109/58 (08/22 0002) SpO2:  [95 %-98 %] 96 % (08/22 0002) Weight:  [119.3 kg (263 lb 1.6 oz)] 119.3 kg (263 lb 1.6 oz) (08/22 0555)  Pre op weight 121.1 kg Current Weight  12/04/16 119.3 kg (263 lb 1.6 oz)   Intake/Output from previous day: 08/21 0701 - 08/22 0700 In: 240 [P.O.:240] Out: 2075 [Urine:2075]   Physical Exam:  Cardiovascular: RRR, no murmur Pulmonary: Clear to auscultation bilaterally Abdomen: Soft, non tender, bowel sounds present. Extremities: Trace  bilateral lower extremity edema. Wounds: Clean and dry.  No erythema or signs of infection.  Lab Results: CBC:  Recent Labs  12/02/16 0436 12/03/16 0414  WBC 11.2* 11.2*  HGB 9.6* 9.2*  HCT 29.8* 28.6*  PLT 138* 207   BMET:   Recent Labs  12/02/16 0436 12/03/16 0414  NA 135 135  K 4.1 4.1  CL 99* 101  CO2 30 28  GLUCOSE 123* 118*  BUN 16 18  CREATININE 0.88 0.91  CALCIUM 7.9* 8.2*    PT/INR:  Lab Results  Component Value Date   INR 1.27 12/03/2016   INR 1.22 12/02/2016   INR 1.26 12/01/2016   ABG:  INR: Will add last result for INR, ABG once components are confirmed Will add last 4 CBG results once components are confirmed  Assessment/Plan:  1. CV-Previous a fib. Had brief episodes PAF 08/21. On  Lopressor 25 mg bid and Couamdin.  Has mechanical AVR. INR remains 1.27 so will increase Coumadin to 7.5 mg daily. 2. Pulmonary-on room air. CXR this am appears stable (small pleural effusions and bibasilar atelectasis). Encourage incentive spirometer 3.Volume overload-continue with Lasix 40 mg orally daily 4.ABL anemia- H and H stable at 9.2 and 28.6.  5. TSH 11.678 so Levothyroxine increased to 100 mcg daily. He will need to follow up with his medical doctor after discharge. 6. Need INR to be increased before able to discharge as he has a mechanical vavle.  ZIMMERMAN,DONIELLE MPA-C 12/04/2016,7:10 AM    I have seen and examined the patient and agree with the assessment and plan as outlined.  Tentatively plan d/c home tomorrow if INR trending up.  Rexene Alberts, MD 12/04/2016 10:12 AM

## 2016-12-04 NOTE — Progress Notes (Signed)
CARDIAC REHAB PHASE I   PRE:  Rate/Rhythm: 71 SR PACs  BP:  Supine: 136/80  Sitting:   Standing:    SaO2: 96%RA  MODE:  Ambulation: 750 ft   POST:  Rate/Rhythm: 82 SR  BP:  Supine:   Sitting: 152/89  Standing:    SaO2: 95%RA 1000-1040 Pt walked 750 ft on RA with rolling walker and tolerated well. A little SOB. Sats good on RA. Pt demonstrated 1250-1553ml on IS. To recliner after walk. Encouraged walks and iS.   Graylon Good, RN BSN  12/04/2016 10:34 AM

## 2016-12-05 LAB — BASIC METABOLIC PANEL
Anion gap: 8 (ref 5–15)
BUN: 17 mg/dL (ref 6–20)
CHLORIDE: 100 mmol/L — AB (ref 101–111)
CO2: 28 mmol/L (ref 22–32)
Calcium: 8.4 mg/dL — ABNORMAL LOW (ref 8.9–10.3)
Creatinine, Ser: 0.91 mg/dL (ref 0.61–1.24)
GFR calc Af Amer: >60 mL/min (ref >60)
GFR calc non Af Amer: >60 mL/min (ref >60)
Glucose, Bld: 110 mg/dL — ABNORMAL HIGH (ref 65–99)
POTASSIUM: 4.7 mmol/L (ref 3.5–5.1)
SODIUM: 136 mmol/L (ref 135–145)

## 2016-12-05 LAB — PROTIME-INR
INR: 1.45
Prothrombin Time: 17.8 seconds — ABNORMAL HIGH (ref 11.4–15.2)

## 2016-12-05 LAB — MAGNESIUM: MAGNESIUM: 2.1 mg/dL (ref 1.7–2.4)

## 2016-12-05 NOTE — Progress Notes (Signed)
CARDIAC REHAB PHASE I   PRE:  Rate/Rhythm: 84 SR  BP:  Supine: 130/76  Sitting:   Standing:    SaO2: 97%RA  MODE:  Ambulation: 790 ft   POST:  Rate/Rhythm: 89 SR  BP:  Supine:   Sitting: 132/80  Standing:    SaO2: 96%RA 0925-1015 Pt walked 790 ft with rolling walker independently. Can walk with walker independently. Does not need walker for home use. Tolerated well. To recliner with call bell. Education completed with pt who voiced understanding. Reviewed IS, sternal precautions, ex ed and heart healthy diet watching carbs. Discussed CRP 2 and will refer to [facility] program. Wrote down how to view discharge video.   Graylon Good, RN BSN  12/05/2016 10:11 AM

## 2016-12-05 NOTE — Progress Notes (Addendum)
[address]       ,[place] [address]             [phone]      6 Days Post-Op Procedure(s) (LRB): AORTIC VALVE REPLACEMENT (AVR) using 23MM Carbonmedics Valve (N/A) CORONARY ARTERY BYPASS GRAFTING (CABG) x one, using right leg greater saphenous vein harvested endoscopically (N/A) TRANSESOPHAGEAL ECHOCARDIOGRAM (TEE) (N/A) Subjective: Feels pretty well, some intermit. afib/flutter  Objective: Vital signs in last 24 hours: Temp:  [98.4 F (36.9 C)-99.7 F (37.6 C)] 99 F (37.2 C) (08/23 0435) Pulse Rate:  [69-79] 79 (08/23 0435) Cardiac Rhythm: Atrial fibrillation (08/22 1900) Resp:  [18-22] 22 (08/23 0435) BP: (73-151)/(64-87) 151/87 (08/23 0435) SpO2:  [96 %-99 %] 99 % (08/23 0435) Weight:  [260 lb 8 oz (118.2 kg)] 260 lb 8 oz (118.2 kg) (08/23 0435)  Hemodynamic parameters for last 24 hours:    Intake/Output from previous day: 08/22 0701 - 08/23 0700 In: 960 [P.O.:960] Out: 651 [Urine:650; Stool:1] Intake/Output this shift: No intake/output data recorded.  General appearance: alert, cooperative and no distress Heart: regular rate and rhythm and + mech click, soft systolic murmur Lungs: dim in bases Abdomen: benign Extremities: min edema Wound: incis healing well  Lab Results:  Recent Labs  12/03/16 0414  WBC 11.2*  HGB 9.2*  HCT 28.6*  PLT 207   BMET:  Recent Labs  12/03/16 0414 12/05/16 0316  NA 135 136  K 4.1 4.7  CL 101 100*  CO2 28 28  GLUCOSE 118* 110*  BUN 18 17  CREATININE 0.91 0.91  CALCIUM 8.2* 8.4*    PT/INR:  Recent Labs  12/05/16 0316  LABPROT 17.8*  INR 1.45   ABG    Component Value Date/Time   PHART 7.325 (L) 11/30/2016 0437   HCO3 23.9 11/30/2016 0437   TCO2 25 11/30/2016 1715   ACIDBASEDEF 2.0 11/30/2016 0437   O2SAT 96.0 11/30/2016 0437   CBG (last 3)   Recent Labs  12/02/16 2118 12/03/16 0629 12/04/16 1112  GLUCAP 107* 108* 119*    Meds Scheduled Meds: . amiodarone  400 mg  Oral BID  . aspirin EC  81 mg Oral Daily  . chlorhexidine  15 mL Mouth Rinse BID  . Chlorhexidine Gluconate Cloth  6 each Topical Daily  . docusate sodium  200 mg Oral Daily  . enoxaparin (LOVENOX) injection  30 mg Subcutaneous QHS  . furosemide  40 mg Oral Daily  . levothyroxine  100 mcg Oral QAC breakfast  . mouth rinse  15 mL Mouth Rinse q12n4p  . metoprolol tartrate  50 mg Oral BID  . pantoprazole  40 mg Oral Daily  . potassium chloride  20 mEq Oral Daily  . simvastatin  20 mg Oral q1800  . sodium chloride flush  10-40 mL Intracatheter Q12H  . sodium chloride flush  3 mL Intravenous Q12H  . warfarin  7.5 mg Oral q1800  . warfarin   Does not apply Once  . Warfarin - Physician Dosing Inpatient   Does not apply q1800   Continuous Infusions: PRN Meds:.metoprolol tartrate, morphine injection, ondansetron (ZOFRAN) IV, oxyCODONE, sodium chloride flush, sodium chloride flush, traMADol  Xrays Dg Chest 2 View  Result Date: 12/04/2016 CLINICAL DATA:  Atelectasis. EXAM: CHEST  2 VIEW COMPARISON:  12/03/2016 FINDINGS: Sequelae of prior CABG and aortic valve replacement are again identified. The cardiac silhouette remains enlarged. Mild left basilar opacity is unchanged, likely atelectasis. Small bilateral pleural effusions are unchanged.  No pneumothorax is seen. No acute osseous abnormality is identified. IMPRESSION: Unchanged small pleural effusions and basilar atelectasis. Electronically Signed   By: Logan Bores M.D.   On: 12/04/2016 07:48    Assessment/Plan: S/P Procedure(s) (LRB): AORTIC VALVE REPLACEMENT (AVR) using 23MM Carbonmedics Valve (N/A) CORONARY ARTERY BYPASS GRAFTING (CABG) x one, using right leg greater saphenous vein harvested endoscopically (N/A) TRANSESOPHAGEAL ECHOCARDIOGRAM (TEE) (N/A)  1 overall doing well 2 some afib/flutter- on amio and lopressor- leave at current dose 3 INR slowly rising, 1.45 today- cont coumadin at 7.5 today 4 SBP shows significant range from  73-151, will monitor without additional meds at this point 5 BS adeq controlled, A1C 6.3 - diet control with emphasis on lower carbs 6 H/H fairly stable but decreasing slowly over time - monitor 7 creat is stable, UO is good, mild edema/small pleural effus- cont lasix for now 8 poss home today or tomorrow- surgeon currently in emergency case , will wait to discuss   LOS: 6 days    Delgado,Michael E 12/05/2016  I have seen and examined the patient and agree with the assessment and plan as outlined.  Will observe rhythm and tentatively plan d/c home tomorrow after we recheck INR   Rexene Alberts, MD 12/05/2016 10:49 AM

## 2016-12-06 LAB — PROTIME-INR
INR: 1.88
Prothrombin Time: 21.8 seconds — ABNORMAL HIGH (ref 11.4–15.2)

## 2016-12-06 MED ORDER — WARFARIN SODIUM 5 MG PO TABS: 5.0000 mg | ORAL_TABLET | Freq: Every day | ORAL | 1 refills | Status: DC

## 2016-12-06 MED ORDER — METOPROLOL TARTRATE 50 MG PO TABS: 50.0000 mg | ORAL_TABLET | Freq: Two times a day (BID) | ORAL | 1 refills | Status: DC

## 2016-12-06 MED ORDER — LEVOTHYROXINE SODIUM 100 MCG PO TABS: 100.0000 ug | ORAL_TABLET | Freq: Every day | ORAL | 1 refills | Status: DC

## 2016-12-06 MED ORDER — AMIODARONE HCL 200 MG PO TABS: 200.0000 mg | ORAL_TABLET | Freq: Two times a day (BID) | ORAL | 1 refills | Status: DC

## 2016-12-06 MED ORDER — WARFARIN SODIUM 5 MG PO TABS: 5.0000 mg | ORAL_TABLET | Freq: Every day | ORAL | Status: DC

## 2016-12-06 NOTE — Progress Notes (Signed)
Dc instructions reviewed with patient and wife- verbalized understanding. Prescriptions given. Belongings given to patient and wife.

## 2016-12-06 NOTE — Progress Notes (Signed)
      [address]       [place] [address]             [phone]        7 Days Post-Op Procedure(s) (LRB): AORTIC VALVE REPLACEMENT (AVR) using 23MM Carbonmedics Valve (N/A) CORONARY ARTERY BYPASS GRAFTING (CABG) x one, using right leg greater saphenous vein harvested endoscopically (N/A) TRANSESOPHAGEAL ECHOCARDIOGRAM (TEE) (N/A)  Subjective: Patient without complaints this am. He hopes to go home.  Objective: Vital signs in last 24 hours: Temp:  [98.5 F (36.9 C)-100.6 F (38.1 C)] 98.5 F (36.9 C) (08/24 0501) Pulse Rate:  [73-85] 74 (08/24 0501) Cardiac Rhythm: Normal sinus rhythm (08/24 0501) Resp:  [18-24] 19 (08/24 0501) BP: (112-144)/(66-75) 113/69 (08/24 0501) SpO2:  [95 %-97 %] 95 % (08/24 0501) Weight:  [257 lb (116.6 kg)] 257 lb (116.6 kg) (08/24 0501)  Pre op weight 121.1 kg Current Weight  12/06/16 257 lb (116.6 kg)   Intake/Output from previous day: 08/23 0701 - 08/24 0700 In: 1080 [P.O.:1080] Out: 400 [Urine:400]   Physical Exam:  Cardiovascular: RRR, no murmur Pulmonary: Clear to auscultation bilaterally Abdomen: Soft, non tender, bowel sounds present. Extremities: No lower extremity edema. Wounds: Clean and dry.  No erythema or signs of infection.  Lab Results: CBC: No results for input(s): WBC, HGB, HCT, PLT in the last 72 hours. BMET:   Recent Labs  12/05/16 0316  NA 136  K 4.7  CL 100*  CO2 28  GLUCOSE 110*  BUN 17  CREATININE 0.91  CALCIUM 8.4*    PT/INR:  Lab Results  Component Value Date   INR 1.88 12/06/2016   INR 1.45 12/05/2016   INR 1.27 12/04/2016   ABG:  INR: Will add last result for INR, ABG once components are confirmed Will add last 4 CBG results once components are confirmed  Assessment/Plan:  1. CV-Previous a fib. Has had brief episodes PAF. On  Lopressor 50 mg bid and Coumadin. Has mechanical AVR. INR increased from 1.45 to 1.88. He has been given 2 doses of Coumadin 7.5 so will  decrease to 5 mg at discharge.  2. Pulmonary-on room air. Encourage incentive spirometer 3.Volume overload-on Lasix 40 mg orally daily and is below pre op weight. Will not continue at discharge 4.ABL anemia- Last H and H stable at 9.2 and 28.6.  5. TSH 11.678 so Levothyroxine increased to 100 mcg daily. He will need to follow up with his medical doctor after discharge. 6. Likely discharge today  Eben Choinski MPA-C 12/06/2016,7:35 AM

## 2016-12-06 NOTE — Progress Notes (Signed)
CARDIAC REHAB PHASE I   Pt states he already ambulated this morning, awaiting discharge. Pt requested to review cardiac surgery discharge education with wife present. Reviewed IS, sternal precautions, activity progression, exercise guidelines, heat healthy diet, daily weights and phase 2 cardiac rehab (referral send to [facility]). Pt and wife verbalized understanding. Pt in bed, call bell within reach.   [place], RN, BSN 12/06/2016 12:24 PM

## 2016-12-09 ENCOUNTER — Ambulatory Visit (INDEPENDENT_AMBULATORY_CARE_PROVIDER_SITE_OTHER): Payer: Managed Care, Other (non HMO) | Admitting: Pharmacist Clinician (PhC)/ Clinical Pharmacy Specialist

## 2016-12-09 DIAGNOSIS — Z954 Presence of other heart-valve replacement: Secondary | ICD-10-CM

## 2016-12-09 DIAGNOSIS — Z7901 Long term (current) use of anticoagulants: Secondary | ICD-10-CM

## 2016-12-09 LAB — POCT INR: INR: 4.2

## 2016-12-17 ENCOUNTER — Ambulatory Visit
Admission: RE | Admit: 2016-12-17 | Discharge: 2016-12-17 | Disposition: A | Discharge status: Home / Self Care | Payer: Managed Care, Other (non HMO) | Source: Ambulatory Visit | Attending: Thoracic Surgery (Cardiothoracic Vascular Surgery) | Admitting: Thoracic Surgery (Cardiothoracic Vascular Surgery)

## 2016-12-17 ENCOUNTER — Ambulatory Visit (INDEPENDENT_AMBULATORY_CARE_PROVIDER_SITE_OTHER): Payer: Self-pay | Admitting: Physician Assistant

## 2016-12-17 ENCOUNTER — Other Ambulatory Visit: Payer: Self-pay | Admitting: *Deleted

## 2016-12-17 VITALS — BP 130/82 | HR 74 | Temp 98.3°F | Resp 20 | Ht 69.0 in | Wt 149.0 lb

## 2016-12-17 DIAGNOSIS — Z951 Presence of aortocoronary bypass graft: Secondary | ICD-10-CM

## 2016-12-17 DIAGNOSIS — R053 Chronic cough: Secondary | ICD-10-CM

## 2016-12-17 DIAGNOSIS — Z952 Presence of prosthetic heart valve: Secondary | ICD-10-CM

## 2016-12-17 DIAGNOSIS — I350 Nonrheumatic aortic (valve) stenosis: Secondary | ICD-10-CM

## 2016-12-17 DIAGNOSIS — I2510 Atherosclerotic heart disease of native coronary artery without angina pectoris: Secondary | ICD-10-CM

## 2016-12-17 DIAGNOSIS — R05 Cough: Secondary | ICD-10-CM

## 2016-12-17 MED ORDER — BENZONATATE 100 MG PO CAPS: 100.0000 mg | ORAL_CAPSULE | Freq: Three times a day (TID) | ORAL | 0 refills | Status: DC | PRN

## 2016-12-17 NOTE — Progress Notes (Signed)
Noel Rodier is a 56 y.o. male patient who returns to the office for evaluation of cough/fever.   1. S/P AVR (aortic valve replacement)   2. S/P CABG x 1   3. Coronary artery disease involving native coronary artery of native heart without angina pectoris   4. Nonrheumatic aortic valve stenosis    Past Medical History:  Diagnosis Date  . Anginal pain ([place])    occ none recent  . Blood dyscrasia    lupus anti coagulant  . Cancer ([place]) 08/2016   renal   . CHF (congestive heart failure) ([place])   . Chronic anticoagulation    INR goal 2.5-3.5  . COPD (chronic obstructive pulmonary disease) ([place])   . Coronary artery disease involving native coronary artery of native heart without angina pectoris   . Erectile dysfunction   . GERD (gastroesophageal reflux disease)   . Heart murmur   . History of bowel infarction   . Hx of Hodgkin's lymphoma   . Hx of lupus anticoagulant disorder    history of blood work showing lupus  . Hyperlipidemia   . Hypertension   . Hypothyroidism   . Pneumonia    hx  . S/P aortic valve replacement with mechanical valve 11/29/2016   23 mm Sorin Carbomedics top hat bileaflet mechanical valve  . Severe aortic stenosis   . Thyroid disease   . Tobacco dependency    No past surgical history pertinent negatives on file.   Scheduled Meds: Current Outpatient Prescriptions on File Prior to Visit  Medication Sig Dispense Refill  . amiodarone (PACERONE) 200 MG tablet Take 1 tablet (200 mg total) by mouth 2 (two) times daily. take for 2 weeks then take 200 mg by mouth daily thereafter 60 tablet 1  . aspirin EC 81 MG EC tablet Take 1 tablet (81 mg total) by mouth daily.    . budesonide-formoterol (SYMBICORT) 80-4.5 MCG/ACT inhaler Inhale 1 puff into the lungs at bedtime as needed (shortness of breath).    . diphenhydramine-acetaminophen (TYLENOL PM) 25-500 MG TABS tablet Take 1 tablet by mouth at bedtime.    Marland Kitchen levothyroxine (SYNTHROID, LEVOTHROID) 100 MCG tablet Take 1  tablet (100 mcg total) by mouth daily before breakfast. 30 tablet 1  . loratadine (CLARITIN) 10 MG tablet Take 10 mg by mouth daily as needed for allergies.    . metoprolol tartrate (LOPRESSOR) 50 MG tablet Take 1 tablet (50 mg total) by mouth 2 (two) times daily. 60 tablet 1  . Omega-3 Fatty Acids (FISH OIL PO) Take 3 capsules by mouth at bedtime. 1200/360 MG    . oxyCODONE (OXY IR/ROXICODONE) 5 MG immediate release tablet Take 5 mg by mouth every 4-6 hours PRN severe pain. 30 tablet 0  . simvastatin (ZOCOR) 40 MG tablet Take 40 mg by mouth every evening.    . warfarin (COUMADIN) 5 MG tablet Take 1 tablet (5 mg total) by mouth daily at 6 PM. Or as directed 30 tablet 1   No current facility-administered medications on file prior to visit.      Blood pressure 130/82, pulse 74, temperature 98.3 F (36.8 C), temperature source Oral, resp. rate 20, height 5\' 9"  (1.753 m), weight 67.6 kg (149 lb), SpO2 97 %.  Subjective : The patient presents today for evaluation of a chronic cough/fever. Over the last several days he has had a cough that is unrelieved with Mucinex Robitussin. His highest fever was 100.4. He has an appointment with his primary care doctor on Thursday, Dr.  Little.   Objective: Cor: RRR Pulm: rhonchi, coarse breath sounds Adb: no tenderness Incisions: clean and dry Extremities: no edema  Assessment & Plan  Mr. Malva Cogan presents today with a chronic cough which was present before surgery. He claims that it did go away while he was in the hospital but returned a few days after discharge. He also has had a low-grade fever the highest being 100.4. He has tried Robitussin and Mucinex at home. He's been on Mucinex for about 10 days and he's on his second bottle of Robitussin. He states that he hasn't been able to sleep much at night due to this cough. I reviewed his chest x-ray which did not show any pneumonia and his pleural effusions did improve. He is on chronic corticosteroids which I  explained does lower his immune system and make him more susceptible to viral infections. His cough is dry and nonproductive. He states difficult to cough hard enough to cough up any mucus. I recommended Tessalon Perles and trying a different over-the-counter cough suppressant. I discussed the dangers of phenylephrine after heart surgery since this can create tachycardia. He does have a primary care appointment on Thursday with Dr. Rex Kras. I suggested that he could possibly benefit from 18 albuterol rescue inhaler especially when he has illness on top of his chronic lung disease. His incisions are pristine. He does not have any symptoms of urinary tract infection, however this might be worth checking. I will leave it up to his primary care provider to prescribe antibiotics if they feel it is necessary. I explained to the patient that I felt this was a chronic viral infection and that antibiotics would not necessarily help any of his symptoms. He has an appointment with [place] on 12/24/2016. He plans to attend. I asked the patient to call us with any questions or concerns.  Elgie Collard 12/17/2016

## 2016-12-17 NOTE — Patient Instructions (Signed)
Follow-up with your primary care provider on Thursday, September 6.  Tessalon pearls for chronic cough.  Please follow-up with our office on 12/24/2016.

## 2016-12-18 ENCOUNTER — Ambulatory Visit: Payer: Managed Care, Other (non HMO) | Admitting: Cardiology

## 2016-12-24 ENCOUNTER — Encounter: Payer: Self-pay | Admitting: Physician Assistant

## 2016-12-24 ENCOUNTER — Ambulatory Visit (INDEPENDENT_AMBULATORY_CARE_PROVIDER_SITE_OTHER): Payer: Managed Care, Other (non HMO) | Admitting: Physician Assistant

## 2016-12-24 VITALS — BP 142/83 | HR 69 | Ht 69.0 in | Wt 252.0 lb

## 2016-12-24 DIAGNOSIS — E669 Obesity, unspecified: Secondary | ICD-10-CM

## 2016-12-24 DIAGNOSIS — I480 Paroxysmal atrial fibrillation: Secondary | ICD-10-CM

## 2016-12-24 DIAGNOSIS — Z954 Presence of other heart-valve replacement: Secondary | ICD-10-CM

## 2016-12-24 DIAGNOSIS — I10 Essential (primary) hypertension: Secondary | ICD-10-CM | POA: Diagnosis not present

## 2016-12-24 DIAGNOSIS — E785 Hyperlipidemia, unspecified: Secondary | ICD-10-CM

## 2016-12-24 DIAGNOSIS — Z7901 Long term (current) use of anticoagulants: Secondary | ICD-10-CM

## 2016-12-24 NOTE — Progress Notes (Signed)
Cardiology Office Note    Date:  12/24/2016   [name], DOB 11/07/1960, MRN [id number]  PCP:  Michael Fess, MD  Cardiologist: Dr. Radford Pax  No chief complaint on file.   History of Present Illness:  Michael Delgado is a 56 y.o. male with history of aortic stenosis, hypertension, hyperlipidemia, lupus anticoagulant positive on long-term warfarin anticoagulation, Hodgkin's lymphoma status post chemotherapy and mantle radiation therapy to the chest, hypothyroidism, GE reflux disease, and remote history of tobacco abuse.   Patient underwent aortic valve replacement with mechanical valve and CABG 1 SVG to the distal RCA 11/29/16. LVEF 60-65% echo 10/07/16. Intraop TEE LVEF 55-60%.  Patient comes in today for post hospital follow-up accompanied by his wife. He's had a cough and COPD exacerbation treated with steroids by primary care. He was there this morning and going back tomorrow. His INR got up to 8 and he was given vitamin K by Dr. Rex Kras and then his INR dropped to 1.8. They stopped his aspirin and just restarted his Coumadin. He has an INR tomorrow. He still complaining of making cough because he stopped all his cough medications thinking it was affecting his INR. Denies chest pain, palpitations, dizziness or presyncope. No bleeding problems. Wants to start cardiac rehabilitation but has to start soon because he gets new insurance starting in October will be able to afford the co-pay. He has good days and bad days. Sometimes he feels like he can run a marathon in the next day he doesn't feel like doing anything.     Past Medical History:  Diagnosis Date  . Anginal pain ([place])    occ none recent  . Blood dyscrasia    lupus anti coagulant  . Cancer ([place]) 08/2016   renal   . CHF (congestive heart failure) ([place])   . Chronic anticoagulation    INR goal 2.5-3.5  . COPD (chronic obstructive pulmonary disease) ([place])   . Coronary artery disease involving native coronary artery of  native heart without angina pectoris   . Erectile dysfunction   . GERD (gastroesophageal reflux disease)   . Heart murmur   . History of bowel infarction   . Hx of Hodgkin's lymphoma   . Hx of lupus anticoagulant disorder    history of blood work showing lupus  . Hyperlipidemia   . Hypertension   . Hypothyroidism   . Pneumonia    hx  . S/P aortic valve replacement with mechanical valve 11/29/2016   23 mm Sorin Carbomedics top hat bileaflet mechanical valve  . Severe aortic stenosis   . Thyroid disease   . Tobacco dependency     Past Surgical History:  Procedure Laterality Date  . ABDOMINAL SURGERY  1999  . AORTIC VALVE REPLACEMENT N/A 11/29/2016   Procedure: AORTIC VALVE REPLACEMENT (AVR) using 23MM Carbonmedics Valve;  Surgeon: Rexene Alberts, MD;  Location: [facility];  Service: Open Heart Surgery;  Laterality: N/A;  . BOWEL INFARCTION  1997   HAD SURGERY AND FOUND TO HAVE LUPUS ANTICOAGULANT  . CORONARY ARTERY BYPASS GRAFT N/A 11/29/2016   Procedure: CORONARY ARTERY BYPASS GRAFTING (CABG) x one, using right leg greater saphenous vein harvested endoscopically;  Surgeon: Rexene Alberts, MD;  Location: [facility];  Service: Open Heart Surgery;  Laterality: N/A;  . HODGKIN  LYMPHOMA WITH RADIATION AND CHEMOTHERAPY  1984  . RIGHT/LEFT HEART CATH AND CORONARY ANGIOGRAPHY N/A 10/23/2016   Procedure: Right/Left Heart Cath and Coronary Angiography;  Surgeon: Troy Sine, MD;  Location: [facility];  Service: Cardiovascular;  Laterality: N/A;  . ROBOTIC ASSITED PARTIAL NEPHRECTOMY Right 09/11/2016   Procedure: XI ROBOTIC ASSISTED RETROPERITONEAL PARTIAL NEPHRECTOMY;  Surgeon: Alexis Frock, MD;  Location: [facility];  Service: Urology;  Laterality: Right;  . TEE WITHOUT CARDIOVERSION N/A 11/29/2016   Procedure: TRANSESOPHAGEAL ECHOCARDIOGRAM (TEE);  Surgeon: Rexene Alberts, MD;  Location: [facility];  Service: Open Heart Surgery;  Laterality: N/A;  . THORACIC AORTOGRAM N/A 10/23/2016    Procedure: Thoracic Aortogram;  Surgeon: Troy Sine, MD;  Location: [facility];  Service: Cardiovascular;  Laterality: N/A;    Current Medications: Current Meds  Medication Sig  . amiodarone (PACERONE) 200 MG tablet Take 1 tablet (200 mg total) by mouth 2 (two) times daily. take for 2 weeks then take 200 mg by mouth daily thereafter  . budesonide-formoterol (SYMBICORT) 80-4.5 MCG/ACT inhaler Inhale 1 puff into the lungs at bedtime as needed (shortness of breath).  . diphenhydramine-acetaminophen (TYLENOL PM) 25-500 MG TABS tablet Take 1 tablet by mouth at bedtime.  Marland Kitchen levothyroxine (SYNTHROID, LEVOTHROID) 100 MCG tablet Take 1 tablet (100 mcg total) by mouth daily before breakfast.  . metoprolol tartrate (LOPRESSOR) 50 MG tablet Take 1 tablet (50 mg total) by mouth 2 (two) times daily.  . Omega-3 Fatty Acids (FISH OIL PO) Take 3 capsules by mouth at bedtime. 1200/360 MG  . oxyCODONE (OXY IR/ROXICODONE) 5 MG immediate release tablet Take 5 mg by mouth every 4-6 hours PRN severe pain.  Marland Kitchen PROAIR HFA 108 (90 Base) MCG/ACT inhaler Inhale 2 puffs into the lungs daily as needed. AS NEEDED FOR COPD  . simvastatin (ZOCOR) 40 MG tablet Take 40 mg by mouth every evening.  . warfarin (COUMADIN) 5 MG tablet Take 1 tablet (5 mg total) by mouth daily at 6 PM. Or as directed     Allergies:   Patient has no known allergies.   Social History   Social History  . Marital status: Married    Spouse name: N/A  . Number of children: N/A  . Years of education: N/A   Social History Main Topics  . Smoking status: Former Smoker    Quit date: 06/12/2014  . Smokeless tobacco: Never Used  . Alcohol use No  . Drug use: No  . Sexual activity: Not Asked   Other Topics Concern  . None   Social History Narrative  . None     Family History:  The patient's family history includes Arthritis in his father, mother, and sister; Asthma in his sister; Cancer - Ovarian in his mother; Diabetes in his father;  Healthy in his brother; Heart disease in his brother and sister; High Cholesterol in his father and mother; Hypertension in his brother, father, and mother.   ROS:   Please see the history of present illness.    Review of Systems  Cardiovascular: Positive for dyspnea on exertion.  Respiratory: Positive for cough and wheezing.   Neurological: Positive for dizziness and headaches.   All other systems reviewed and are negative.   PHYSICAL EXAM:   VS:  BP (!) 142/83   Pulse 69   Ht 5\' 9"  (1.753 m)   Wt 252 lb (114.3 kg)   SpO2 94%   BMI 37.21 kg/m   Physical Exam  GEN: Obese, in no acute distress  Neck: no JVD, carotid bruits, or masses Cardiac:Incisions healing well RRR; Crisp valvular click with 2/6 systolic murmur in the left sternal border Respiratory:  Decreased breath  sounds but clear to auscultation bilaterally, normal work of breathing GI: soft, nontender, nondistended, + BS Ext: without cyanosis, clubbing, or edema, Good distal pulses bilaterally Neuro:  Alert and Oriented x 3 Psych: euthymic mood, full affect  Wt Readings from Last 3 Encounters:  12/24/16 252 lb (114.3 kg)  12/17/16 149 lb (67.6 kg)  12/06/16 257 lb (116.6 kg)      Studies/Labs Reviewed:   EKG:  EKG is  ordered today.  The ekg ordered today demonstrates Normal sinus rhythm right bundle branch block, no acute change right bundle branch is new  Recent Labs: 12/02/2016: ALT 31; TSH 11.678 12/03/2016: Hemoglobin 9.2; Platelets 207 12/05/2016: BUN 17; Creatinine, Ser 0.91; Magnesium 2.1; Potassium 4.7; Sodium 136   Lipid Panel No results found for: CHOL, TRIG, HDL, CHOLHDL, VLDL, LDLCALC, LDLDIRECT  Additional studies/ records that were reviewed today include:  TEE Intra-Op 8/17/18Conclusions  Result status: Final result   Left ventricle: Normal cavity size, left ventricular diastolic function and left atrial pressure. Concentric hypertrophy of mild severity. LV systolic function is normal with an EF  of 55-60%. There are no obvious wall motion abnormalities. No thrombus present. No mass present.  Aortic valve: The valve is probable trileaflet. Severe valve thickening present. Severe valve calcification present. Severely decreased leaflet separation. Severe stenosis. No regurgitation. No AV vegetation.  Mitral valve: No leaflet thickening and calcification present. Trace regurgitation.  Right ventricle: Normal cavity size, wall thickness and ejection fraction.  Septum: Bubble study negative. Color doppler with small left to right flow. Can not rule out very small ASD/PFO. .        2-D echo 6/25/18Study Conclusions   - Left ventricle: The cavity size was normal. Wall thickness was   increased in a pattern of mild LVH. Systolic function was normal.   The estimated ejection fraction was in the range of 60% to 65%. - Aortic valve: AV is thickened, calcified with restricted motion.   Peak and mean gradients through the valve are 73 and 42 mm Hg   respectively consistent with severe AS. There was moderate   regurgitation. - Left atrium: The atrium was mildly dilated. - Pulmonary arteries: PA peak pressure: 36 mm Hg (S).   Left/right cardiac catheterization 7/11/18Conclusion     Acute Mrg lesion, 80 %stenosed.  Ost RCA to Prox RCA lesion, 70 %stenosed.  1st RPLB lesion, 100 %stenosed.  The left ventricular systolic function is normal.  LV end diastolic pressure is normal.  There is severe aortic valve stenosis. There is moderate (3+) aortic regurgitation.  There is moderate (3+) aortic regurgitation.   Mildly elevated right heart pressures with mild pulmonary hypertension.   Severe calcific aortic valve stenosis with reduced excursion and a mean gradient of 44 mmHg; an estimated valve area of 0.98 cm.  Moderate aortic insufficiency.   Mild aortic root dilatation with atherosclerosis and calcification of the proximal aorta.   Normal LV systolic function with an ejection  fraction of 55-60% without focal segmental wall motion abnormalities.   Single vessel coronary artery disease with 70% smooth ostial stenosis in a large RCA, 80% stenosis in the marginal branch, and distal total occlusion of the PLA with left-to-right collateralization to the distal PLA segment.   RECOMMENDATION: Dr. Radford Pax was contacted regarding the results of the catheterization.  The patient will be discharged home later today with plans for outpatient surgical consultation.         ASSESSMENT:    1. S/P aortic valve replacement with mechanical  valve + CABG x1   2. Essential hypertension   3. Dyslipidemia   4. Chronic anticoagulation   5. Paroxysmal atrial fibrillation (HCC)   6. Obesity (BMI 30-39.9)      PLAN:  In order of problems listed above:  Status post aVR with mechanical valve and CABG 1. Overall he is stable. His INR is not regulated as dictated above. Offered him to be followed in our Coumadin clinic for close follow-up if he would like. Resume aspirin. Coumadin per Dr. Rex Kras. Cardiac rehabilitation. Follow-up with Dr. Radford Pax in 2 months.  Essential hypertension blood pressure stable  Dyslipidemia continue Zocor  Chronic anticoagulation with lupus anticoagulant positive on chronic Coumadin  Paroxysmal atrial fibrillation postop on amiodarone and holding normal sinus rhythm. Probably can stop in another month.  COPD with exacerbation just finish steroid pack and following closely with primary care. Told him he could take the Tessalon and Mucinex if needed  Obesity weight loss program recommended.      Medication Adjustments/Labs and Tests Ordered: Current medicines are reviewed at length with the patient today.  Concerns regarding medicines are outlined above.  Medication changes, Labs and Tests ordered today are listed in the Patient Instructions below. Patient Instructions  Medication Instructions:  Your physician recommends that you continue on your  current medications as directed. Please refer to the Current Medication list given to you today.   Labwork: None ordered  Testing/Procedures: None ordered  Follow-Up: Your physician recommends that you schedule a follow-up appointment in: 2 MONTHS WITH DR. Radford Pax ONLY.the patient IS S/P CABG   Any Other Special Instructions Will Be Listed Below (If Applicable).     If you need a refill on your cardiac medications before your next appointment, please call your pharmacy.      Sumner Boast, PA-C  12/24/2016 2:04 PM    [facility] [address], [place], [place]  [address] Phone: [phone]; Fax: [phone]

## 2016-12-24 NOTE — Patient Instructions (Signed)
Medication Instructions:  Your physician recommends that you continue on your current medications as directed. Please refer to the Current Medication list given to you today.   Labwork: None ordered  Testing/Procedures: None ordered  Follow-Up: Your physician recommends that you schedule a follow-up appointment in: 2 MONTHS WITH DR. Radford Pax ONLY.the patient IS S/P CABG   Any Other Special Instructions Will Be Listed Below (If Applicable).     If you need a refill on your cardiac medications before your next appointment, please call your pharmacy.

## 2016-12-30 ENCOUNTER — Encounter: Payer: Self-pay | Admitting: *Deleted

## 2016-12-30 NOTE — Progress Notes (Signed)
A referral was signed by Dr. Roxy Manns and faxed to [facility] per their request for Michael Delgado to begin the cardiac rehab phase II program.

## 2017-01-03 ENCOUNTER — Other Ambulatory Visit: Payer: Self-pay | Admitting: Thoracic Surgery (Cardiothoracic Vascular Surgery)

## 2017-01-03 DIAGNOSIS — Z951 Presence of aortocoronary bypass graft: Secondary | ICD-10-CM

## 2017-01-05 ENCOUNTER — Emergency Department (HOSPITAL_BASED_OUTPATIENT_CLINIC_OR_DEPARTMENT_OTHER)
Admission: EM | Admit: 2017-01-05 | Discharge: 2017-01-06 | Disposition: A | Discharge status: Home / Self Care | Payer: Managed Care, Other (non HMO) | Attending: Emergency Medicine | Admitting: Emergency Medicine

## 2017-01-05 ENCOUNTER — Emergency Department (HOSPITAL_BASED_OUTPATIENT_CLINIC_OR_DEPARTMENT_OTHER): Payer: Managed Care, Other (non HMO)

## 2017-01-05 ENCOUNTER — Encounter (HOSPITAL_BASED_OUTPATIENT_CLINIC_OR_DEPARTMENT_OTHER): Payer: Self-pay | Admitting: Emergency Medicine

## 2017-01-05 DIAGNOSIS — Z7901 Long term (current) use of anticoagulants: Secondary | ICD-10-CM | POA: Diagnosis not present

## 2017-01-05 DIAGNOSIS — I2510 Atherosclerotic heart disease of native coronary artery without angina pectoris: Secondary | ICD-10-CM | POA: Diagnosis not present

## 2017-01-05 DIAGNOSIS — Z87891 Personal history of nicotine dependence: Secondary | ICD-10-CM | POA: Insufficient documentation

## 2017-01-05 DIAGNOSIS — I480 Paroxysmal atrial fibrillation: Secondary | ICD-10-CM | POA: Insufficient documentation

## 2017-01-05 DIAGNOSIS — I509 Heart failure, unspecified: Secondary | ICD-10-CM | POA: Insufficient documentation

## 2017-01-05 DIAGNOSIS — R791 Abnormal coagulation profile: Secondary | ICD-10-CM

## 2017-01-05 DIAGNOSIS — I110 Hypertensive heart disease with heart failure: Secondary | ICD-10-CM | POA: Insufficient documentation

## 2017-01-05 DIAGNOSIS — Z951 Presence of aortocoronary bypass graft: Secondary | ICD-10-CM | POA: Diagnosis not present

## 2017-01-05 DIAGNOSIS — Z79899 Other long term (current) drug therapy: Secondary | ICD-10-CM | POA: Diagnosis not present

## 2017-01-05 DIAGNOSIS — J111 Influenza due to unidentified influenza virus with other respiratory manifestations: Secondary | ICD-10-CM

## 2017-01-05 DIAGNOSIS — E785 Hyperlipidemia, unspecified: Secondary | ICD-10-CM | POA: Insufficient documentation

## 2017-01-05 DIAGNOSIS — J449 Chronic obstructive pulmonary disease, unspecified: Secondary | ICD-10-CM | POA: Insufficient documentation

## 2017-01-05 DIAGNOSIS — R69 Illness, unspecified: Secondary | ICD-10-CM

## 2017-01-05 DIAGNOSIS — R05 Cough: Secondary | ICD-10-CM | POA: Diagnosis not present

## 2017-01-05 DIAGNOSIS — I10 Essential (primary) hypertension: Secondary | ICD-10-CM | POA: Insufficient documentation

## 2017-01-05 DIAGNOSIS — R21 Rash and other nonspecific skin eruption: Secondary | ICD-10-CM

## 2017-01-05 DIAGNOSIS — R509 Fever, unspecified: Secondary | ICD-10-CM | POA: Diagnosis not present

## 2017-01-05 LAB — CBC WITH DIFFERENTIAL/PLATELET
BASOS PCT: 0 %
Basophils Absolute: 0 10*3/uL (ref 0.0–0.1)
EOS ABS: 0.1 10*3/uL (ref 0.0–0.7)
Eosinophils Relative: 1 %
HEMATOCRIT: 33.8 % — AB (ref 39.0–52.0)
Hemoglobin: 10.9 g/dL — ABNORMAL LOW (ref 13.0–17.0)
LYMPHS ABS: 0.4 10*3/uL — AB (ref 0.7–4.0)
Lymphocytes Relative: 5 %
MCH: 26.2 pg (ref 26.0–34.0)
MCHC: 32.2 g/dL (ref 30.0–36.0)
MCV: 81.3 fL (ref 78.0–100.0)
MONO ABS: 0.7 10*3/uL (ref 0.1–1.0)
MONOS PCT: 8 %
Neutro Abs: 7.1 10*3/uL (ref 1.7–7.7)
Neutrophils Relative %: 86 %
Platelets: 307 10*3/uL (ref 150–400)
RBC: 4.16 MIL/uL — ABNORMAL LOW (ref 4.22–5.81)
RDW: 14.8 % (ref 11.5–15.5)
WBC: 8.3 10*3/uL (ref 4.0–10.5)

## 2017-01-05 LAB — URINALYSIS, ROUTINE W REFLEX MICROSCOPIC
BILIRUBIN URINE: NEGATIVE
Glucose, UA: NEGATIVE mg/dL
Hgb urine dipstick: NEGATIVE
Ketones, ur: 15 mg/dL — AB
Leukocytes, UA: NEGATIVE
NITRITE: NEGATIVE
PH: 6 (ref 5.0–8.0)
Protein, ur: NEGATIVE mg/dL
SPECIFIC GRAVITY, URINE: 1.025 (ref 1.005–1.030)

## 2017-01-05 LAB — COMPREHENSIVE METABOLIC PANEL
ALBUMIN: 3.6 g/dL (ref 3.5–5.0)
ALK PHOS: 74 U/L (ref 38–126)
ALT: 33 U/L (ref 17–63)
ANION GAP: 7 (ref 5–15)
AST: 22 U/L (ref 15–41)
BILIRUBIN TOTAL: 0.8 mg/dL (ref 0.3–1.2)
BUN: 14 mg/dL (ref 6–20)
CALCIUM: 8.3 mg/dL — AB (ref 8.9–10.3)
CO2: 24 mmol/L (ref 22–32)
Chloride: 102 mmol/L (ref 101–111)
Creatinine, Ser: 0.97 mg/dL (ref 0.61–1.24)
GFR calc Af Amer: >60 mL/min (ref >60)
GFR calc non Af Amer: >60 mL/min (ref >60)
GLUCOSE: 115 mg/dL — AB (ref 65–99)
POTASSIUM: 4 mmol/L (ref 3.5–5.1)
SODIUM: 133 mmol/L — AB (ref 135–145)
TOTAL PROTEIN: 7.2 g/dL (ref 6.5–8.1)

## 2017-01-05 LAB — TROPONIN I

## 2017-01-05 LAB — PROTIME-INR
INR: 1.94
Prothrombin Time: 22 seconds — ABNORMAL HIGH (ref 11.4–15.2)

## 2017-01-05 LAB — I-STAT CG4 LACTIC ACID, ED: Lactic Acid, Venous: 0.73 mmol/L (ref 0.5–1.9)

## 2017-01-05 MED ORDER — AZITHROMYCIN 250 MG PO TABS: 250.0000 mg | ORAL_TABLET | Freq: Every day | ORAL | 0 refills | Status: DC

## 2017-01-05 MED ORDER — AZITHROMYCIN 250 MG PO TABS
500.0000 mg | ORAL_TABLET | Freq: Once | ORAL | Status: AC
  Administered 2017-01-05: 500 mg via ORAL
  Filled 2017-01-05: qty 2

## 2017-01-05 MED ORDER — ACETAMINOPHEN 325 MG PO TABS
650.0000 mg | ORAL_TABLET | Freq: Once | ORAL | Status: AC | PRN
  Administered 2017-01-05: 650 mg via ORAL
  Filled 2017-01-05: qty 2

## 2017-01-05 MED ORDER — DEXTROSE 5 % IV SOLN
1.0000 g | Freq: Once | INTRAVENOUS | Status: AC
  Administered 2017-01-05: 1 g via INTRAVENOUS
  Filled 2017-01-05: qty 10

## 2017-01-05 MED ORDER — OSELTAMIVIR PHOSPHATE 75 MG PO CAPS: 75.0000 mg | ORAL_CAPSULE | Freq: Two times a day (BID) | ORAL | 0 refills | Status: DC

## 2017-01-05 MED ORDER — OSELTAMIVIR PHOSPHATE 75 MG PO CAPS
75.0000 mg | ORAL_CAPSULE | Freq: Once | ORAL | Status: AC
  Administered 2017-01-05: 75 mg via ORAL
  Filled 2017-01-05: qty 1

## 2017-01-05 MED ORDER — SODIUM CHLORIDE 0.9 % IV BOLUS (SEPSIS)
1000.0000 mL | Freq: Once | INTRAVENOUS | Status: AC
  Administered 2017-01-05: 1000 mL via INTRAVENOUS

## 2017-01-05 MED ORDER — IBUPROFEN 800 MG PO TABS: 800.0000 mg | ORAL_TABLET | Freq: Once | ORAL | Status: DC

## 2017-01-05 MED ORDER — CEPHALEXIN 500 MG PO CAPS: 500.0000 mg | ORAL_CAPSULE | Freq: Three times a day (TID) | ORAL | 0 refills | Status: DC

## 2017-01-05 NOTE — Discharge Instructions (Signed)
Take keflex and azithromycin as prescribed for pneumonia.   Take tamiflu twice daily as prescribed. Flu test was sent and will take a day to come back. Your doctor can follow up the results.   Your INR is 1.9. Take your coumadin tonight. Talk to your doctor about it tomorrow.   Your rash should improve with the antibiotics.   See your doctor as scheduled tomorrow   Return to ER if you have worse shortness of breath, cough, trouble breathing, persistent fever for a week.

## 2017-01-05 NOTE — ED Provider Notes (Signed)
[facility] Provider Note   CSN: [id number] Arrival date & time: 01/05/17  2021     History   Chief Complaint Chief Complaint  Patient presents with  . Cough  . Fever    HPI Michael Delgado is a 56 y.o. male history of CHF, COPD, lupus anticoagulant on chronic Coumadin, recent CABG and aortic valve replacement on 8/17 here presenting with cough, fevers. Patient states that he has been running fevers since yesterday. His temperature has been around 102-103 at home and has been taking Tylenol. He is on Coumadin and so was told he cannot take ibuprofen. Patient has been having persistent cough after the CABG but it has been more productive since yesterday. Denies any nausea or vomiting or trouble breathing or urinary symptoms. Denies sick contacts.   The history is provided by the patient.    Past Medical History:  Diagnosis Date  . Anginal pain ([place])    occ none recent  . Blood dyscrasia    lupus anti coagulant  . Cancer ([place]) 08/2016   renal   . CHF (congestive heart failure) ([place])   . Chronic anticoagulation    INR goal 2.5-3.5  . COPD (chronic obstructive pulmonary disease) ([place])   . Coronary artery disease involving native coronary artery of native heart without angina pectoris   . Erectile dysfunction   . GERD (gastroesophageal reflux disease)   . Heart murmur   . History of bowel infarction   . Hx of Hodgkin's lymphoma   . Hx of lupus anticoagulant disorder    history of blood work showing lupus  . Hyperlipidemia   . Hypertension   . Hypothyroidism   . Pneumonia    hx  . S/P aortic valve replacement with mechanical valve 11/29/2016   23 mm Sorin Carbomedics top hat bileaflet mechanical valve  . S/P CABG x 1 11/29/2016  . Severe aortic stenosis   . Thyroid disease   . Tobacco dependency     Patient Active Problem List   Diagnosis Date Noted  . Paroxysmal atrial fibrillation ([place]) 12/24/2016  . S/P aortic valve replacement with mechanical valve +  CABG x1 11/29/2016  . S/P CABG x 1 11/29/2016  . Coronary artery disease involving native coronary artery of native heart without angina pectoris   . Chronic anticoagulation 10/17/2016  . History of lymphoma 10/17/2016  . Obesity (BMI 30-39.9) 10/17/2016  . Nonrheumatic aortic valve stenosis 10/01/2016  . Essential hypertension 10/01/2016  . Dyslipidemia 10/01/2016  . History of renal cell cancer 09/11/2016    Past Surgical History:  Procedure Laterality Date  . ABDOMINAL SURGERY  1999  . AORTIC VALVE REPLACEMENT N/A 11/29/2016   Procedure: AORTIC VALVE REPLACEMENT (AVR) using 23MM Carbonmedics Valve;  Surgeon: Rexene Alberts, MD;  Location: [facility];  Service: Open Heart Surgery;  Laterality: N/A;  . BOWEL INFARCTION  1997   HAD SURGERY AND FOUND TO HAVE LUPUS ANTICOAGULANT  . CORONARY ARTERY BYPASS GRAFT N/A 11/29/2016   Procedure: CORONARY ARTERY BYPASS GRAFTING (CABG) x one, using right leg greater saphenous vein harvested endoscopically;  Surgeon: Rexene Alberts, MD;  Location: [facility];  Service: Open Heart Surgery;  Laterality: N/A;  . HODGKIN  LYMPHOMA WITH RADIATION AND CHEMOTHERAPY  1984  . RIGHT/LEFT HEART CATH AND CORONARY ANGIOGRAPHY N/A 10/23/2016   Procedure: Right/Left Heart Cath and Coronary Angiography;  Surgeon: Troy Sine, MD;  Location: [facility];  Service: Cardiovascular;  Laterality: N/A;  . ROBOTIC ASSITED PARTIAL NEPHRECTOMY  Right 09/11/2016   Procedure: XI ROBOTIC ASSISTED RETROPERITONEAL PARTIAL NEPHRECTOMY;  Surgeon: Alexis Frock, MD;  Location: [facility];  Service: Urology;  Laterality: Right;  . TEE WITHOUT CARDIOVERSION N/A 11/29/2016   Procedure: TRANSESOPHAGEAL ECHOCARDIOGRAM (TEE);  Surgeon: Rexene Alberts, MD;  Location: [facility];  Service: Open Heart Surgery;  Laterality: N/A;  . THORACIC AORTOGRAM N/A 10/23/2016   Procedure: Thoracic Aortogram;  Surgeon: Troy Sine, MD;  Location: [facility];  Service: Cardiovascular;  Laterality:  N/A;       Home Medications    Prior to Admission medications   Medication Sig Start Date End Date Taking? Authorizing Provider  amiodarone (PACERONE) 200 MG tablet Take 1 tablet (200 mg total) by mouth 2 (two) times daily. take for 2 weeks then take 200 mg by mouth daily thereafter 12/06/16   Nani Skillern, PA-C  budesonide-formoterol [facility]) 80-4.5 MCG/ACT inhaler Inhale 1 puff into the lungs at bedtime as needed (shortness of breath).    [provider]  diphenhydramine-acetaminophen (TYLENOL PM) 25-500 MG TABS tablet Take 1 tablet by mouth at bedtime.    [provider]  levothyroxine (SYNTHROID, LEVOTHROID) 100 MCG tablet Take 1 tablet (100 mcg total) by mouth daily before breakfast. 12/06/16   Nani Skillern, PA-C  metoprolol tartrate (LOPRESSOR) 50 MG tablet Take 1 tablet (50 mg total) by mouth 2 (two) times daily. 12/06/16   Nani Skillern, PA-C  Omega-3 Fatty Acids (FISH OIL PO) Take 3 capsules by mouth at bedtime. 1200/360 MG    [provider]  oxyCODONE (OXY IR/ROXICODONE) 5 MG immediate release tablet Take 5 mg by mouth every 4-6 hours PRN severe pain. 12/04/16   Lars Pinks M, PA-C  PROAIR HFA 108 [phone] Base) MCG/ACT inhaler Inhale 2 puffs into the lungs daily as needed. AS NEEDED FOR COPD 12/19/16   [provider]  simvastatin (ZOCOR) 40 MG tablet Take 40 mg by mouth every evening.    [provider]  warfarin (COUMADIN) 5 MG tablet Take 1 tablet (5 mg total) by mouth daily at 6 PM. Or as directed 12/06/16   Nani Skillern, PA-C    Family History Family History  Problem Relation Age of Onset  . Arthritis Mother   . Hypertension Mother   . High Cholesterol Mother   . Cancer - Ovarian Mother   . Diabetes Father   . Hypertension Father   . Arthritis Father   . High Cholesterol Father   . Arthritis Sister   . Heart disease Brother   . Hypertension Brother   . Healthy Brother   . Asthma Sister    . Heart disease Sister     Social History Social History  Substance Use Topics  . Smoking status: Former Smoker    Quit date: 06/12/2014  . Smokeless tobacco: Never Used  . Alcohol use No     Allergies   Patient has no known allergies.   Review of Systems Review of Systems  Constitutional: Positive for fever.  Respiratory: Positive for cough.   All other systems reviewed and are negative.    Physical Exam Updated Vital Signs BP 118/72   Pulse 87   Temp (!) 101.2 F (38.4 C) (Oral)   Resp 18   Ht 5\' 9"  (1.753 m)   Wt 109.8 kg (242 lb)   SpO2 92%   BMI 35.74 kg/m   Physical Exam  Constitutional: He is oriented to person, place, and time.  Chronically ill  HENT:  Head: Normocephalic.  MM slightly dry   Eyes: Pupils are equal, round, and reactive to light. Conjunctivae and EOM are normal.  Neck: Normal range of motion. Neck supple.  Cardiovascular: Normal rate, regular rhythm and normal heart sounds.   Pulmonary/Chest:  Slightly tachypneic, diminished L base. No wheezing or crackles   Abdominal: Soft. Bowel sounds are normal. He exhibits no distension. There is no tenderness. There is no guarding.  Musculoskeletal: Normal range of motion. He exhibits no edema.  Neurological: He is alert and oriented to person, place, and time. No cranial nerve deficit. Coordination normal.  Skin: Skin is warm.  Small macular papular rash on R lower abdomen with no signs of cellulitis and no fluctuance.   Psychiatric: He has a normal mood and affect.  Nursing note and vitals reviewed.    ED Treatments / Results  Labs (all labs ordered are listed, but only abnormal results are displayed) Labs Reviewed  CBC WITH DIFFERENTIAL/PLATELET - Abnormal; Notable for the following:       Result Value   RBC 4.16 (*)    Hemoglobin 10.9 (*)    HCT 33.8 (*)    Lymphs Abs 0.4 (*)    All other components within normal limits  COMPREHENSIVE METABOLIC PANEL - Abnormal; Notable for the  following:    Sodium 133 (*)    Glucose, Bld 115 (*)    Calcium 8.3 (*)    All other components within normal limits  PROTIME-INR - Abnormal; Notable for the following:    Prothrombin Time 22.0 (*)    All other components within normal limits  URINALYSIS, ROUTINE W REFLEX MICROSCOPIC - Abnormal; Notable for the following:    Ketones, ur 15 (*)    All other components within normal limits  CULTURE, BLOOD (ROUTINE X 2)  CULTURE, BLOOD (ROUTINE X 2)  URINE CULTURE  TROPONIN I  INFLUENZA PANEL BY PCR (TYPE A & B)  I-STAT CG4 LACTIC ACID, ED    EKG  EKG Interpretation  Date/Time:  Sunday January 05 2017 21:30:06 EDT Ventricular Rate:  82 PR Interval:    QRS Duration: 110 QT Interval:  420 QTC Calculation: 491 R Axis:   84 Text Interpretation:  Sinus rhythm Low voltage, precordial leads Abnormal inferior Q waves Minimal ST depression, inferior leads Borderline prolonged QT interval TWI new since previous  Confirmed by Wandra Arthurs [phone]) on 01/05/2017 10:12:32 PM       Radiology Dg Chest 2 View  Result Date: 01/05/2017 CLINICAL DATA:  Cough for 5 weeks after CABG. History of COPD and CHF. EXAM: CHEST  2 VIEW COMPARISON:  Chest radiograph December 17, 2016 FINDINGS: Cardiac silhouette is mildly enlarged. Status post median sternotomy for CABG and pin cardiac valve replacement. Prominent bronchovascular markings and mild interstitial prominence without pleural effusion or focal consolidation. No pneumothorax. Soft tissue planes and included osseous structures are nonsuspicious. IMPRESSION: Mild cardiomegaly. Interstitial prominence seen with atypical infection and, pulmonary edema. Electronically Signed   By: Elon Alas M.D.   On: 01/05/2017 22:43    Procedures Procedures (including critical care time)  Medications Ordered in ED Medications  cefTRIAXone (ROCEPHIN) 1 g in dextrose 5 % 50 mL IVPB (not administered)  azithromycin (ZITHROMAX) tablet 500 mg (not  administered)  oseltamivir (TAMIFLU) capsule 75 mg (not administered)  acetaminophen (TYLENOL) tablet 650 mg (650 mg Oral Given 01/05/17 2243)  sodium chloride 0.9 % bolus 1,000 mL (1,000 mLs Intravenous New Bag/Given 01/05/17 2145)  Initial Impression / Assessment and Plan / ED Course  I have reviewed the triage vital signs and the nursing notes.  Pertinent labs & imaging results that were available during my care of the patient were reviewed by me and considered in my medical decision making (see chart for details).     Michael Delgado is a 56 y.o. male recent CABG here with cough, fever. Concerned for possible HCAP vs flu vs bacteremia. Will do sepsis workup and get flu swab as well.   11:26 PM WBC nl, lactate nl. UA showed no UTI. CXR showed possible atypical infection. Given no leukocytosis and CXR findings, I suspect flu or early pneumonia. His O2 is 95-98% on RA and he doesn't appear short of breath currently. I will empirically treat him for flu and pneumonia with keflex, azithromycin, tamiflu. He has a macular papular rash on R lower abdomen that doesn't appear cellulitic and also appear like contact dermatitis. I told him to watch it for now. Stable for discharge. Has follow up with PCP and CT surgeon tomorrow.    Final Clinical Impressions(s) / ED Diagnoses   Final diagnoses:  None    New Prescriptions New Prescriptions   No medications on file     Drenda Freeze, MD 01/05/17 2334

## 2017-01-05 NOTE — ED Notes (Signed)
ED Provider at bedside. 

## 2017-01-05 NOTE — ED Notes (Signed)
Pt last had tylenol at [facility].

## 2017-01-05 NOTE — ED Triage Notes (Signed)
Pt c/o cough x 5 wks s/p CABG 11/29/16; developed fever yesterdayand now cough is productive

## 2017-01-06 ENCOUNTER — Ambulatory Visit (INDEPENDENT_AMBULATORY_CARE_PROVIDER_SITE_OTHER): Payer: Self-pay | Admitting: Thoracic Surgery (Cardiothoracic Vascular Surgery)

## 2017-01-06 ENCOUNTER — Encounter: Payer: Self-pay | Admitting: Thoracic Surgery (Cardiothoracic Vascular Surgery)

## 2017-01-06 VITALS — BP 175/92 | HR 79 | Temp 100.8°F | Resp 18 | Ht 69.0 in | Wt 241.0 lb

## 2017-01-06 DIAGNOSIS — Z952 Presence of prosthetic heart valve: Secondary | ICD-10-CM

## 2017-01-06 DIAGNOSIS — R053 Chronic cough: Secondary | ICD-10-CM

## 2017-01-06 DIAGNOSIS — Z951 Presence of aortocoronary bypass graft: Secondary | ICD-10-CM

## 2017-01-06 DIAGNOSIS — R05 Cough: Secondary | ICD-10-CM

## 2017-01-06 DIAGNOSIS — Z954 Presence of other heart-valve replacement: Secondary | ICD-10-CM

## 2017-01-06 LAB — INFLUENZA PANEL BY PCR (TYPE A & B)
Influenza A By PCR: NEGATIVE
Influenza B By PCR: NEGATIVE

## 2017-01-06 MED ORDER — AMIODARONE HCL 200 MG PO TABS: 200.0000 mg | ORAL_TABLET | Freq: Every day | ORAL | 1 refills | Status: DC

## 2017-01-06 NOTE — Progress Notes (Signed)
[address]       [place] [address]             [phone]     CARDIOTHORACIC SURGERY OFFICE NOTE  Referring Provider is Sueanne Margarita, MD PCP is [name], MD   HPI:  Patient is a 56 year old male with history of aortic stenosis, coronary artery disease without angina pectoris, hypertension, hyperlipidemia, lupus anticoagulant positive on long-term warfarin anticoagulation, Hodgkin's lymphoma status post chemotherapy and mantle radiation therapy in the past, hypothyroidism, GE reflux disease, and remote history of tobacco abuse who returns to the office today for routine follow-up status post aortic valve replacement using a mechanical prosthetic valve and coronary artery bypass grafting 1 on 11/29/2016 for severe symptomatic aortic stenosis with single-vessel coronary artery disease.  His early postoperative recovery was notable for the development of paroxysmal atrial fibrillation for which she was treated with amiodarone.  His early postoperative recovery in the hospital was otherwise uneventful and he was discharged home on the 7th postoperative day.  He was seen in follow-up in our office on 12/17/2016 at which time he was doing fairly well.  He states that he continued to progress fairly well over the next two weeks. However, 2 days ago he suddenly developed fever, congestion, increased productive cough, and generalized malaise. He presented to the emergency department yesterday with continued complaint of cough and low-grade fever.  White blood count was normal and urinalysis unrevealing. Chest x-ray was clear other than prominent interstitial markings with no significant pulmonary infiltrate and no pleural effusions. He was prescribed Tamiflu because of concerns of flulike syndrome and given both Keflex and azithromycin for possible developing pneumonia. Hospital admission was not recommended.  Blood cultures and urine culture obtained at that time remain no  growth to date. He returns to our office today for follow-up.  Overnight he has continued to have low-grade fever and a productive cough. He has developed some nausea and anorexia today with one episode of vomiting. He denies shortness of breath. Cough is productive of thick sputum.   Current Outpatient Prescriptions  Medication Sig Dispense Refill  . amiodarone (PACERONE) 200 MG tablet Take 1 tablet (200 mg total) by mouth 2 (two) times daily. take for 2 weeks then take 200 mg by mouth daily thereafter 60 tablet 1  . azithromycin (ZITHROMAX) 250 MG tablet Take 1 tablet (250 mg total) by mouth daily. 4 tablet 0  . budesonide-formoterol (SYMBICORT) 80-4.5 MCG/ACT inhaler Inhale 1 puff into the lungs at bedtime as needed (shortness of breath).    . cephALEXin (KEFLEX) 500 MG capsule Take 1 capsule (500 mg total) by mouth 3 (three) times daily. 30 capsule 0  . diphenhydramine-acetaminophen (TYLENOL PM) 25-500 MG TABS tablet Take 1 tablet by mouth at bedtime.    Marland Kitchen levothyroxine (SYNTHROID, LEVOTHROID) 100 MCG tablet Take 1 tablet (100 mcg total) by mouth daily before breakfast. 30 tablet 1  . metoprolol tartrate (LOPRESSOR) 50 MG tablet Take 1 tablet (50 mg total) by mouth 2 (two) times daily. 60 tablet 1  . Omega-3 Fatty Acids (FISH OIL PO) Take 3 capsules by mouth at bedtime. 1200/360 MG    . oseltamivir (TAMIFLU) 75 MG capsule Take 1 capsule (75 mg total) by mouth every 12 (twelve) hours. 10 capsule 0  . oxyCODONE (OXY IR/ROXICODONE) 5 MG immediate release tablet Take 5 mg by mouth every 4-6 hours PRN severe pain. 30 tablet 0  . PROAIR HFA 108 (90 Base)  MCG/ACT inhaler Inhale 2 puffs into the lungs daily as needed. AS NEEDED FOR COPD    . simvastatin (ZOCOR) 40 MG tablet Take 40 mg by mouth every evening.    . warfarin (COUMADIN) 5 MG tablet Take 1 tablet (5 mg total) by mouth daily at 6 PM. Or as directed 30 tablet 1   No current facility-administered medications for this visit.       Physical  Exam:   BP (!) 175/92 (BP Location: Right Arm, Patient Position: Sitting, Cuff Size: Large)   Pulse 79   Temp (!) 100.8 F (38.2 C) (Oral)   Resp 18   Ht 5\' 9"  (1.753 m)   Wt 241 lb (109.3 kg)   SpO2 93% Comment: ON RA  BMI 35.59 kg/m   General:  Ill-appearing  Chest:   Few scattered rhonchi but overall fairly clear  CV:   Regular rate and rhythm  Incisions:  Healing nicely, sternum is stable  Abdomen:  Soft nontender  Extremities:  Warm and well-perfused, no lower extremity edema  Diagnostic Tests:  CHEST  2 VIEW  COMPARISON:  Chest radiograph December 17, 2016  FINDINGS: Cardiac silhouette is mildly enlarged. Status post median sternotomy for CABG and pin cardiac valve replacement. Prominent bronchovascular markings and mild interstitial prominence without pleural effusion or focal consolidation. No pneumothorax. Soft tissue planes and included osseous structures are nonsuspicious.  IMPRESSION: Mild cardiomegaly. Interstitial prominence seen with atypical infection and, pulmonary edema.   Electronically Signed   By: Elon Alas M.D.   On: 01/05/2017 22:43   Impression:  Patient appears stable from a cardiac standpoint approximately 5 weeks status post aortic valve replacement using a bileaflet mechanical prosthetic valve and single-vessel coronary artery bypass grafting. Over the last 48 hours he has developed sudden onset of fevers, productive cough, generalized malaise, anorexia, and nausea with 1 episode of emesis, all suspicious for flulike syndrome.  He denies any symptoms of chest pain or shortness of breath.  At the time of evaluation in the emergency Department yesterday white blood count was normal and chest x-ray revealed no pulmonary infiltrates or pleural effusions. Blood cultures and urine culture remain no growth to date.     Plan:  I have instructed the patient to decrease his dose of amiodarone to 200 mg daily. We have otherwise not  recommended any changes to his current medications. He has a follow-up appointment scheduled with his primary care physician later this week. I have instructed the patient to return to emergency department for hospital admission if he develops significant fevers greater than 102 and/or the development of shortness of breath. We will obtain routine follow-up echocardiogram both to reassess left ventricular function and his aortic valve but also to make sure he doesn't have a significant pericardial effusion.  We will follow up to make sure that his blood cultures are no growth. Patient will return to our office in approximately 2 months for routine follow-up. If his condition does not improve we will call and return sooner and/or present directly to the emergency room for further evaluation. All questions answered.    Valentina Gu. Roxy Manns, MD 01/06/2017 4:26 PM

## 2017-01-06 NOTE — Patient Instructions (Addendum)
Decrease amiodarone to 200 mg daily  Continue all other previous medications without any changes at this time  Keep your appointment with Dr Rex Kras for later this week  Go to the emergency room if you have fever > 102 degrees and/or your symptoms get worse

## 2017-01-07 ENCOUNTER — Other Ambulatory Visit: Payer: Self-pay | Admitting: Thoracic Surgery (Cardiothoracic Vascular Surgery)

## 2017-01-07 DIAGNOSIS — Z952 Presence of prosthetic heart valve: Secondary | ICD-10-CM

## 2017-01-07 LAB — URINE CULTURE: Culture: <10000 — AB

## 2017-01-08 ENCOUNTER — Telehealth (HOSPITAL_BASED_OUTPATIENT_CLINIC_OR_DEPARTMENT_OTHER): Payer: Self-pay | Admitting: *Deleted

## 2017-01-08 ENCOUNTER — Ambulatory Visit (HOSPITAL_COMMUNITY)
Admission: RE | Admit: 2017-01-08 | Discharge: 2017-01-08 | Disposition: A | Discharge status: Home / Self Care | Payer: Managed Care, Other (non HMO) | Source: Ambulatory Visit | Attending: Thoracic Surgery (Cardiothoracic Vascular Surgery) | Admitting: Thoracic Surgery (Cardiothoracic Vascular Surgery)

## 2017-01-08 DIAGNOSIS — E785 Hyperlipidemia, unspecified: Secondary | ICD-10-CM | POA: Diagnosis not present

## 2017-01-08 DIAGNOSIS — I313 Pericardial effusion (noninflammatory): Secondary | ICD-10-CM | POA: Diagnosis not present

## 2017-01-08 DIAGNOSIS — Z48812 Encounter for surgical aftercare following surgery on the circulatory system: Secondary | ICD-10-CM | POA: Insufficient documentation

## 2017-01-08 DIAGNOSIS — I4891 Unspecified atrial fibrillation: Secondary | ICD-10-CM | POA: Insufficient documentation

## 2017-01-08 DIAGNOSIS — Z952 Presence of prosthetic heart valve: Secondary | ICD-10-CM | POA: Insufficient documentation

## 2017-01-08 DIAGNOSIS — I10 Essential (primary) hypertension: Secondary | ICD-10-CM | POA: Insufficient documentation

## 2017-01-08 LAB — ECHOCARDIOGRAM COMPLETE
AOPV: 0.39 m/s
AOVTI: 58.3 cm
AV Area VTI index: 0.5 cm2/m2
AV Area VTI: 1 cm2
AV Peak grad: 38 mmHg
AV VEL mean LVOT/AV: 0.45
AV area mean vel ind: 0.51 cm2/m2
AV peak Index: 0.45
AV vel: 1.13
AVAREAMEANV: 1.15 cm2
AVG: 20 mmHg
AVLVOTPG: 6 mmHg
AVPKVEL: 310 cm/s
CHL CUP AV VALUE AREA INDEX: 0.5
CHL CUP DOP CALC LVOT VTI: 26 cm
CHL CUP STROKE VOLUME: 68 mL
DOP CAL AO MEAN VELOCITY: 208 cm/s
EWDT: 342 ms
FS: 26 % — AB (ref 28–44)
IV/PV OW: 1.03
LA ID, A-P, ES: 43 mm
LA diam index: 1.92 cm/m2
LAVOLA4C: 73.7 mL
LEFT ATRIUM END SYS DIAM: 43 mm
LV PW d: 11.6 mm — AB (ref 0.6–1.1)
LV sys vol index: 28 mL/m2
LVDIAVOL: 131 mL (ref 62–150)
LVDIAVOLIN: 58 mL/m2
LVOT area: 2.54 cm2
LVOT diameter: 18 mm
LVOT peak VTI: 0.45 cm
LVOTPV: 122 cm/s
LVOTSV: 66 mL
LVSYSVOL: 63 mL — AB (ref 21–61)
Lateral S' vel: 6.05 cm/s
MV Dec: 342
MV Peak grad: 7 mmHg
MV pk A vel: 121 m/s
MV pk E vel: 129 m/s
RV TAPSE: 10.2 mm
Simpson's disk: 52
Valve area: 1.13 cm2

## 2017-01-08 LAB — BLOOD CULTURE ID PANEL (REFLEXED)
ACINETOBACTER BAUMANNII: NOT DETECTED
CANDIDA ALBICANS: NOT DETECTED
Candida glabrata: NOT DETECTED
Candida krusei: NOT DETECTED
Candida parapsilosis: NOT DETECTED
Candida tropicalis: NOT DETECTED
ENTEROBACTERIACEAE SPECIES: NOT DETECTED
Enterobacter cloacae complex: NOT DETECTED
Enterococcus species: NOT DETECTED
Escherichia coli: NOT DETECTED
HAEMOPHILUS INFLUENZAE: NOT DETECTED
Klebsiella oxytoca: NOT DETECTED
Klebsiella pneumoniae: NOT DETECTED
Listeria monocytogenes: NOT DETECTED
METHICILLIN RESISTANCE: NOT DETECTED
NEISSERIA MENINGITIDIS: NOT DETECTED
PSEUDOMONAS AERUGINOSA: NOT DETECTED
Proteus species: NOT DETECTED
STREPTOCOCCUS PYOGENES: NOT DETECTED
Serratia marcescens: NOT DETECTED
Staphylococcus aureus (BCID): NOT DETECTED
Staphylococcus species: DETECTED — AB
Streptococcus agalactiae: NOT DETECTED
Streptococcus pneumoniae: NOT DETECTED
Streptococcus species: NOT DETECTED

## 2017-01-08 NOTE — Progress Notes (Signed)
  Echocardiogram 2D Echocardiogram has been performed.  Michael Delgado 01/08/2017, 8:50 AM

## 2017-01-08 NOTE — Telephone Encounter (Signed)
Call received from [place] at [facility]. Blood cultures drawn on 01/05/2017 have growth in one out of 4 bottles (anaerobic bottle), gram stain shows gram + rods and cocci, BCID staph species, not S. Aureus. EDP Zackowski notified. VORB to contact pt and advise him to see his PC or return to ED if he is having any worsening Sx. Spoke with pt and his wife who verbalize understanding. They also state that pt has seen his PCP and cardiologist since his ED visit.

## 2017-01-10 LAB — CULTURE, BLOOD (ROUTINE X 2): SPECIAL REQUESTS: ADEQUATE

## 2017-01-11 ENCOUNTER — Telehealth: Payer: Self-pay

## 2017-01-11 LAB — CULTURE, BLOOD (ROUTINE X 2)
CULTURE: NO GROWTH
SPECIAL REQUESTS: ADEQUATE

## 2017-01-11 NOTE — Telephone Encounter (Signed)
Post ED Visit - Positive Culture Follow-up  Culture report reviewed by antimicrobial stewardship pharmacist:  []  Elenor Quinones, Pharm.D. []  Heide Guile, Pharm.D., BCPS AQ-ID [x]  Parks Neptune, Pharm.D., BCPS []  Alycia Rossetti, Pharm.D., BCPS []  [place], Pharm.D., BCPS, AAHIVP []  Legrand Como, Pharm.D., BCPS, AAHIVP []  Salome Arnt, PharmD, BCPS []  Dimitri Ped, PharmD, BCPS []  Vincenza Hews, PharmD, BCPS  Positive BC culture Treated with Cephalexin and Azithromycin, organism sensitive to the same and no further patient follow-up is required at this time.  Genia Del 01/11/2017, 11:52 AM

## 2017-01-14 DIAGNOSIS — Z7901 Long term (current) use of anticoagulants: Secondary | ICD-10-CM | POA: Diagnosis not present

## 2017-01-16 DIAGNOSIS — I480 Paroxysmal atrial fibrillation: Secondary | ICD-10-CM | POA: Diagnosis not present

## 2017-01-16 DIAGNOSIS — Z7901 Long term (current) use of anticoagulants: Secondary | ICD-10-CM | POA: Diagnosis not present

## 2017-01-20 DIAGNOSIS — Z7901 Long term (current) use of anticoagulants: Secondary | ICD-10-CM | POA: Diagnosis not present

## 2017-01-24 DIAGNOSIS — Z7901 Long term (current) use of anticoagulants: Secondary | ICD-10-CM | POA: Diagnosis not present

## 2017-01-28 ENCOUNTER — Institutional Professional Consult (permissible substitution): Payer: Managed Care, Other (non HMO) | Admitting: Internal Medicine

## 2017-01-31 DIAGNOSIS — Z7901 Long term (current) use of anticoagulants: Secondary | ICD-10-CM | POA: Diagnosis not present

## 2017-02-10 DIAGNOSIS — E039 Hypothyroidism, unspecified: Secondary | ICD-10-CM | POA: Diagnosis not present

## 2017-02-10 DIAGNOSIS — Z7901 Long term (current) use of anticoagulants: Secondary | ICD-10-CM | POA: Diagnosis not present

## 2017-02-25 ENCOUNTER — Ambulatory Visit (INDEPENDENT_AMBULATORY_CARE_PROVIDER_SITE_OTHER): Payer: BLUE CROSS/BLUE SHIELD | Admitting: Cardiology

## 2017-02-25 ENCOUNTER — Encounter: Payer: Self-pay | Admitting: Cardiology

## 2017-02-25 VITALS — BP 118/70 | HR 70 | Ht 69.0 in | Wt 252.6 lb

## 2017-02-25 DIAGNOSIS — I10 Essential (primary) hypertension: Secondary | ICD-10-CM | POA: Diagnosis not present

## 2017-02-25 DIAGNOSIS — E785 Hyperlipidemia, unspecified: Secondary | ICD-10-CM

## 2017-02-25 DIAGNOSIS — I480 Paroxysmal atrial fibrillation: Secondary | ICD-10-CM

## 2017-02-25 DIAGNOSIS — Z7901 Long term (current) use of anticoagulants: Secondary | ICD-10-CM | POA: Diagnosis not present

## 2017-02-25 DIAGNOSIS — I2510 Atherosclerotic heart disease of native coronary artery without angina pectoris: Secondary | ICD-10-CM

## 2017-02-25 DIAGNOSIS — I350 Nonrheumatic aortic (valve) stenosis: Secondary | ICD-10-CM

## 2017-02-25 NOTE — Patient Instructions (Signed)
Medication Instructions:  Your physician has recommended you make the following change in your medication:   STOP: amiodarone   Labwork: Your physician recommends that you return for lab work in: 1 week for fasting lipids and liver function tests.   Testing/Procedures: None ordered   Follow-Up: Your physician wants you to follow-up in: 6 months with Dr. Radford Pax. You will receive a reminder letter in the mail two months in advance. If you don't receive a letter, please call our office to schedule the follow-up appointment.  Any Other Special Instructions Will Be Listed Below (If Applicable).     If you need a refill on your cardiac medications before your next appointment, please call your pharmacy.

## 2017-02-25 NOTE — Progress Notes (Signed)
Cardiology Office Note:    Date:  02/25/2017   [name], DOB [birth year], MRN [id number]  PCP:  [name], MD  Cardiologist:  Fransico Him, MD   Referring MD: [name], MD   CC:  AS, HTN, CAD   History of Present Illness:    Michael Delgado is a 56 y.o. male with a hx of severe aortic stenosis status post mechanical aortic valve replacement, CAD status post one-vessel CABG with an SVG to the distal RCA both done on 11/29/2016.  Intraoperative TEE showed normal LV function EF 55-60%.  This was complicated by postoperative atrial fibrillation.  He has not had any further arrhythmias since then.  He is now off amiodarone.  He also has a history of hypertension, hyperlipidemia and lupus anticoagulant on long-term warfarin anticoagulation.  He has remote history of Hodgkin's lymphoma status post chemotherapy  with mantle radiation therapy to the chest.  He is here today for followup and is doing well.  He denies any chest pain or pressure, SOB, DOE (except with extreme exertion) , PND, orthopnea, dizziness, palpitations or syncope.  He has intermittent RLE edema in the leg with the SVG harvest.  He is compliant with his  meds and is tolerating meds with no SE.     Past Medical History:  Diagnosis Date  . Anginal pain ([place])    occ none recent  . Blood dyscrasia    lupus anti coagulant  . Cancer ([place]) 08/2016   renal   . CHF (congestive heart failure) ([place])   . Chronic anticoagulation    INR goal 2.5-3.5  . COPD (chronic obstructive pulmonary disease) ([place])   . Coronary artery disease involving native coronary artery of native heart without angina pectoris   . Erectile dysfunction   . GERD (gastroesophageal reflux disease)   . Heart murmur   . History of bowel infarction   . Hx of Hodgkin's lymphoma   . Hx of lupus anticoagulant disorder    history of blood work showing lupus  . Hyperlipidemia   . Hypertension   . Hypothyroidism   . Pneumonia    hx  . S/P aortic  valve replacement with mechanical valve 11/29/2016   23 mm Sorin Carbomedics top hat bileaflet mechanical valve  . S/P CABG x 1 11/29/2016  . Severe aortic stenosis   . Thyroid disease   . Tobacco dependency     Past Surgical History:  Procedure Laterality Date  . ABDOMINAL SURGERY  1999  . BOWEL INFARCTION  1997   HAD SURGERY AND FOUND TO HAVE LUPUS ANTICOAGULANT  . HODGKIN  LYMPHOMA WITH RADIATION AND CHEMOTHERAPY  1984    Current Medications: Current Meds  Medication Sig  . amiodarone (PACERONE) 200 MG tablet Take 1 tablet (200 mg total) by mouth daily. take for 2 weeks then take 200 mg by mouth daily thereafter  . azithromycin (ZITHROMAX) 250 MG tablet Take 1 tablet (250 mg total) by mouth daily.  . budesonide-formoterol (SYMBICORT) 80-4.5 MCG/ACT inhaler Inhale 1 puff into the lungs at bedtime as needed (shortness of breath).  . co-enzyme Q-10 30 MG capsule Take 30 mg daily by mouth.  . Cyanocobalamin (VITAMIN B-12 PO) Take daily by mouth.  . diphenhydramine-acetaminophen (TYLENOL PM) 25-500 MG TABS tablet Take 1 tablet by mouth at bedtime.  Marland Kitchen levothyroxine (SYNTHROID, LEVOTHROID) 100 MCG tablet Take 1 tablet (100 mcg total) by mouth daily before breakfast.  . metoprolol tartrate (LOPRESSOR) 50 MG tablet Take 1 tablet (50 mg  total) by mouth 2 (two) times daily.  . Omega-3 Fatty Acids (FISH OIL PO) Take 3 capsules by mouth at bedtime. 1200/360 MG  . PROAIR HFA 108 (90 Base) MCG/ACT inhaler Inhale 2 puffs into the lungs daily as needed. AS NEEDED FOR COPD  . simvastatin (ZOCOR) 40 MG tablet Take 40 mg by mouth every evening.  Marland Kitchen VITAMIN E PO Take daily by mouth.  . warfarin (COUMADIN) 5 MG tablet Take 1 tablet (5 mg total) by mouth daily at 6 PM. Or as directed     Allergies:   Patient has no known allergies.   Social History   Socioeconomic History  . Marital status: Married    Spouse name: None  . Number of children: None  . Years of education: None  . Highest education  level: None  Social Needs  . Financial resource strain: None  . Food insecurity - worry: None  . Food insecurity - inability: None  . Transportation needs - medical: None  . Transportation needs - non-medical: None  Occupational History  . None  Tobacco Use  . Smoking status: Former Smoker    Last attempt to quit: 06/12/2014    Years since quitting: 2.7  . Smokeless tobacco: Never Used  Substance and Sexual Activity  . Alcohol use: No  . Drug use: No  . Sexual activity: None  Other Topics Concern  . None  Social History Narrative  . None     Family History: The patient's family history includes Arthritis in his father, mother, and sister; Asthma in his sister; Cancer - Ovarian in his mother; Diabetes in his father; Healthy in his brother; Heart disease in his brother and sister; High Cholesterol in his father and mother; Hypertension in his brother, father, and mother.  ROS:   Please see the history of present illness.    ROS  All other systems reviewed and negative.   EKGs/Labs/Other Studies Reviewed:    The following studies were reviewed today: none    Recent Labs: 12/02/2016: TSH 11.678 12/05/2016: Magnesium 2.1 01/05/2017: ALT 33; BUN 14; Creatinine, Ser 0.97; Hemoglobin 10.9; Platelets 307; Potassium 4.0; Sodium 133   Recent Lipid Panel No results found for: CHOL, TRIG, HDL, CHOLHDL, VLDL, LDLCALC, LDLDIRECT  Physical Exam:    VS:  BP 118/70   Pulse 70   Ht 5\' 9"  (1.753 m)   Wt 252 lb 9.6 oz (114.6 kg)   SpO2 96%   BMI 37.30 kg/m     Wt Readings from Last 3 Encounters:  02/25/17 252 lb 9.6 oz (114.6 kg)  01/06/17 241 lb (109.3 kg)  01/05/17 242 lb (109.8 kg)     GEN:  Well nourished, well developed in no acute distress HEENT: Normal NECK: No JVD; No carotid bruits LYMPHATICS: No lymphadenopathy CARDIAC: RRR, no murmurs, rubs, gallops RESPIRATORY:  Clear to auscultation without rales, wheezing or rhonchi  ABDOMEN: Soft, non-tender,  non-distended MUSCULOSKELETAL:  No edema; No deformity  SKIN: Warm and dry NEUROLOGIC:  Alert and oriented x 3 PSYCHIATRIC:  Normal affect   ASSESSMENT:    1. Nonrheumatic aortic valve stenosis   2. Essential hypertension   3. Coronary artery disease involving native coronary artery of native heart without angina pectoris   4. Paroxysmal atrial fibrillation (HCC)   5. Dyslipidemia    PLAN:    In order of problems listed above:  1.  Severe aortic stenosis status post mechanical aortic valve replacement.   He will continue on Coumadin and baby  aspirin daily.  2.  Hypertension-blood pressure well controlled on exam today.  He will continue on BB.    3.  CAD with cath showing 80% acute marginal, 70% ostial RCA, occluded right posterior lateral branch status post 1 vessel CABG with saphenous vein graft to the RCA. He will continue on ASA, statin and beta-blocker therapy.  4.  Postoperative paroxysmal atrial fibrillation-he has had no recurrence of arrhythmias.  He is now 3 months out from surgery and I will stop his amiodarone.  He is on long-term Coumadin for his mechanical valve.  5.  Hyperlipidemia with LDL goal less than 70.  He will continue on Pravachol and I will check an FLP and ALT.    Medication Adjustments/Labs and Tests Ordered: Current medicines are reviewed at length with the patient today.  Concerns regarding medicines are outlined above.  No orders of the defined types were placed in this encounter.  No orders of the defined types were placed in this encounter.   Signed, Fransico Him, MD  02/25/2017 1:11 PM    [facility]

## 2017-03-04 DIAGNOSIS — Z7901 Long term (current) use of anticoagulants: Secondary | ICD-10-CM | POA: Diagnosis not present

## 2017-03-05 ENCOUNTER — Other Ambulatory Visit: Payer: BLUE CROSS/BLUE SHIELD | Admitting: *Deleted

## 2017-03-05 DIAGNOSIS — I350 Nonrheumatic aortic (valve) stenosis: Secondary | ICD-10-CM | POA: Diagnosis not present

## 2017-03-05 DIAGNOSIS — E785 Hyperlipidemia, unspecified: Secondary | ICD-10-CM

## 2017-03-05 LAB — HEPATIC FUNCTION PANEL
ALT: 20 IU/L (ref 0–44)
AST: 19 IU/L (ref 0–40)
Albumin: 4.2 g/dL (ref 3.5–5.5)
Alkaline Phosphatase: 70 IU/L (ref 39–117)
BILIRUBIN, DIRECT: 0.12 mg/dL (ref 0.00–0.40)
Bilirubin Total: 0.3 mg/dL (ref 0.0–1.2)
TOTAL PROTEIN: 6.6 g/dL (ref 6.0–8.5)

## 2017-03-05 LAB — LIPID PANEL
CHOLESTEROL TOTAL: 142 mg/dL (ref 100–199)
Chol/HDL Ratio: 3.6 ratio (ref 0.0–5.0)
HDL: 39 mg/dL — AB (ref >39)
LDL CALC: 86 mg/dL (ref 0–99)
TRIGLYCERIDES: 87 mg/dL (ref 0–149)
VLDL CHOLESTEROL CAL: 17 mg/dL (ref 5–40)

## 2017-03-10 ENCOUNTER — Ambulatory Visit: Payer: Managed Care, Other (non HMO) | Admitting: Thoracic Surgery (Cardiothoracic Vascular Surgery)

## 2017-03-10 ENCOUNTER — Encounter: Payer: Self-pay | Admitting: Thoracic Surgery (Cardiothoracic Vascular Surgery)

## 2017-03-10 ENCOUNTER — Other Ambulatory Visit: Payer: Self-pay

## 2017-03-10 VITALS — BP 148/88 | HR 82 | Ht 69.0 in | Wt 242.0 lb

## 2017-03-10 DIAGNOSIS — Z951 Presence of aortocoronary bypass graft: Secondary | ICD-10-CM | POA: Diagnosis not present

## 2017-03-10 DIAGNOSIS — Z954 Presence of other heart-valve replacement: Secondary | ICD-10-CM

## 2017-03-10 NOTE — Progress Notes (Signed)
[address]       [place] [address]             [phone]     CARDIOTHORACIC SURGERY OFFICE NOTE  Referring Provider is Sueanne Margarita, MD PCP is [name], MD   HPI:  Patient is a 56 year old male with history of aortic stenosis, coronary artery disease, hypertension, hyperlipidemia, lupus anticoagulant positive on long-term warfarin anticoagulation, Hodgkin's lymphoma status post chemotherapy and chest mantle radiation therapy, hypothyroidism, GE reflux disease, and remote history of tobacco abuse who returns to the office today for routine follow-up approximately 3 months status post aortic valve replacement using a mechanical prosthetic valve and single-vessel coronary artery bypass grafting on November 29, 2016.  His immediate postoperative recovery was notable only for the development of postoperative atrial fibrillation for which she was treated with amiodarone.  The patient was last seen here in our office on January 06, 2017 at which time he was doing well.  Since then he has continued to do well from a cardiac standpoint.  He has maintained sinus rhythm since hospital discharge and was recently taken off of amiodarone by Dr. Radford Pax.  He participated in the outpatient cardiac rehab program and purchased a treadmill for use in his home.  He has been gradually increasing his activity.  He has not been very successful at losing weight.  He reports that overall he is doing quite well.  He went back to work approximately 8 weeks after surgery and he has not had any problems with his return to work.  He is pleased with his exercise tolerance and it continues to gradually improve.  He has had some difficulty getting his prothrombin time regulated accurately.   Current Outpatient Medications  Medication Sig Dispense Refill  . aspirin EC 81 MG tablet Take 81 mg daily by mouth.    . budesonide-formoterol (SYMBICORT) 80-4.5 MCG/ACT inhaler Inhale 1 puff into the  lungs at bedtime as needed (shortness of breath).    . co-enzyme Q-10 30 MG capsule Take 30 mg daily by mouth.    . Cyanocobalamin (VITAMIN B-12 PO) Take daily by mouth.    . diphenhydramine-acetaminophen (TYLENOL PM) 25-500 MG TABS tablet Take 1 tablet by mouth at bedtime.    Marland Kitchen levothyroxine (SYNTHROID, LEVOTHROID) 100 MCG tablet Take 1 tablet (100 mcg total) by mouth daily before breakfast. 30 tablet 1  . Omega-3 Fatty Acids (FISH OIL PO) Take 3 capsules by mouth at bedtime. 1200/360 MG    . PROAIR HFA 108 (90 Base) MCG/ACT inhaler Inhale 2 puffs into the lungs daily as needed. AS NEEDED FOR COPD    . simvastatin (ZOCOR) 40 MG tablet Take 40 mg by mouth every evening.    Marland Kitchen VITAMIN E PO Take daily by mouth.    . warfarin (COUMADIN) 5 MG tablet Take 1 tablet (5 mg total) by mouth daily at 6 PM. Or as directed 30 tablet 1   No current facility-administered medications for this visit.       Physical Exam:   BP (!) 148/88   Pulse 82   Ht 5\' 9"  (1.753 m)   Wt 242 lb (109.8 kg)   SpO2 96%   BMI 35.74 kg/m   General:  Well-appearing  Chest:   Clear to auscultation  CV:   Regular rate and rhythm with mechanical prosthetic valve sounds  Incisions:  Completely healed, sternum is stable  Abdomen:  Soft nontender  Extremities:  Warm  and well perfused  Diagnostic Tests:  Transthoracic Echocardiography  Patient:    Michael Delgado, Michael Delgado MR #:       [id number] Study Date: 01/08/2017 Gender:     M Age:        50 Height:     175.3 cm Weight:     109.3 kg BSA:        2.35 m^2 Pt. Status: Room:   ATTENDING    Darylene Price, M.D.  ORDERING     Darylene Price, M.D.  REFERRING    Darylene Price, M.D.  PERFORMING   [facility], Outpatient  SONOGRAPHER  Chelsea Androw  cc:  ------------------------------------------------------------------- LV EF: 50% -   55%  ------------------------------------------------------------------- Indications:      (S/P  AVR).  ------------------------------------------------------------------- History:   PMH:   Atrial fibrillation.  Risk factors: Hypertension. Dyslipidemia.  ------------------------------------------------------------------- Study Conclusions  - Left ventricle: The cavity size was normal. Wall thickness was   increased in a pattern of mild LVH. Systolic function was normal.   The estimated ejection fraction was in the range of 50% to 55%.   Wall motion was normal; there were no regional wall motion   abnormalities. The study is not technically sufficient to allow   evaluation of LV diastolic function. - Aortic valve: s/p Bioprosthetic AVR. No obstruction. Mean   gradient (S): 20 mm Hg. Peak gradient (S): 38 mm Hg. Valve area   (VTI): 1.13 cm^2. Valve area (Vmax): 1 cm^2. Valve area (Vmean):   1.15 cm^2. - Mitral valve: Calcified annulus. Mildly thickened leaflets . - Left atrium: The atrium was normal in size. - Right atrium: The atrium was mildly dilated. - Inferior vena cava: The vessel was normal in size. The   respirophasic diameter changes were in the normal range (>= 50%),   consistent with normal central venous pressure. - Pericardium, extracardiac: A trivial pericardial effusion was   identified posterior to the heart.  Impressions:  - Compared to a prior study in 09/2016, there has been interval   replacement of the aortic valve with a bioprosthesis. The   gradient is elevated, but does not indicated obstruction. LVEF is   lower at 50-55% (from 60-65%).  ------------------------------------------------------------------- Labs, prior tests, procedures, and surgery: Coronary artery bypass grafting.  ------------------------------------------------------------------- Study data:  Comparison was made to the study of 10/07/2016.  Study status:  Routine.  Procedure:  The patient reported no pain pre or post test. Transthoracic echocardiography. Image quality  was adequate.  Study completion:  There were no complications. Transthoracic echocardiography.  M-mode, complete 2D, spectral Doppler, and color Doppler.  Birthdate:  Patient birthdate: [birth year].  Age:  Patient is 56 yr old.  Sex:  Gender: male. BMI: 35.6 kg/m^2.  Blood pressure:     109/67  Patient status: Outpatient.  Study date:  Study date: 01/08/2017. Study time: 08:05 AM.  Location:  [facility].  -------------------------------------------------------------------  ------------------------------------------------------------------- Left ventricle:  The cavity size was normal. Wall thickness was increased in a pattern of mild LVH. Systolic function was normal. The estimated ejection fraction was in the range of 50% to 55%. Wall motion was normal; there were no regional wall motion abnormalities. The study is not technically sufficient to allow evaluation of LV diastolic function.  ------------------------------------------------------------------- Aortic valve:  s/p Bioprosthetic AVR. No obstruction.  Doppler: VTI ratio of LVOT to aortic valve: 0.45. Valve area (VTI): 1.13 cm^2. Indexed valve area (VTI): 0.48 cm^2/m^2. Peak velocity ratio of LVOT to aortic valve: 0.39. Valve area (Vmax): 1 cm^2.  Indexed valve area (Vmax): 0.43 cm^2/m^2. Mean velocity ratio of LVOT to aortic valve: 0.45. Valve area (Vmean): 1.15 cm^2. Indexed valve area (Vmean): 0.49 cm^2/m^2.    Mean gradient (S): 20 mm Hg. Peak gradient (S): 38 mm Hg.  ------------------------------------------------------------------- Aorta:  Aortic root: The aortic root was normal in size. Ascending aorta: The ascending aorta was normal in size.  ------------------------------------------------------------------- Mitral valve:   Calcified annulus. Mildly thickened leaflets . Doppler:  There was trivial regurgitation.    Peak gradient (D): 7 mm  Hg.  ------------------------------------------------------------------- Left atrium:  The atrium was normal in size.  ------------------------------------------------------------------- Right ventricle:  The cavity size was normal. Wall thickness was normal. Systolic function was normal.  ------------------------------------------------------------------- Pulmonic valve:    The valve appears to be grossly normal. Doppler:  There was no significant regurgitation.  ------------------------------------------------------------------- Tricuspid valve:   Doppler:  There was trivial regurgitation.  ------------------------------------------------------------------- Pulmonary artery:   The main pulmonary artery was normal-sized.  ------------------------------------------------------------------- Right atrium:  The atrium was mildly dilated.  ------------------------------------------------------------------- Pericardium:  A trivial pericardial effusion was identified posterior to the heart.  ------------------------------------------------------------------- Systemic veins: Inferior vena cava: The vessel was normal in size. The respirophasic diameter changes were in the normal range (>= 50%), consistent with normal central venous pressure.  ------------------------------------------------------------------- Measurements   Left ventricle                           Value          Reference  LV ID, ED, PLAX chordal           (L)    42.5  mm       43 - 52  LV ID, ES, PLAX chordal                  31.3  mm       23 - 38  LV fx shortening, PLAX chordal    (L)    26    %        >=29  LV PW thickness, ED                      11.6  mm       ----------  IVS/LV PW ratio, ED                      1.03           <=1.3  Stroke volume, 2D                        66    ml       ----------  Stroke volume/bsa, 2D                    28    ml/m^2   ----------  LV ejection fraction, 1-p A4C             51    %        ----------  LV end-diastolic volume, 2-p             131   ml       ----------  LV end-systolic volume, 2-p              63    ml       ----------  LV ejection fraction, 2-p  52    %        ----------  Stroke volume, 2-p                       68    ml       ----------  LV end-diastolic volume/bsa, 2-p         56    ml/m^2   ----------  LV end-systolic volume/bsa, 2-p          27    ml/m^2   ----------  Stroke volume/bsa, 2-p                   28.9  ml/m^2   ----------  Longitudinal strain, TDI                 18    %        ----------    Ventricular septum                       Value          Reference  IVS thickness, ED                        11.9  mm       ----------    LVOT                                     Value          Reference  LVOT ID, S                               18    mm       ----------  LVOT area                                2.54  cm^2     ----------  LVOT peak velocity, S                    122   cm/s     ----------  LVOT mean velocity, S                    94.1  cm/s     ----------  LVOT VTI, S                              26    cm       ----------  LVOT peak gradient, S                    6     mm Hg    ----------    Aortic valve                             Value          Reference  Aortic valve peak velocity, S            310   cm/s     ----------  Aortic valve mean velocity, S  208   cm/s     ----------  Aortic valve VTI, S                      58.3  cm       ----------  Aortic mean gradient, S                  20    mm Hg    ----------  Aortic peak gradient, S                  38    mm Hg    ----------  VTI ratio, LVOT/AV                       0.45           ----------  Aortic valve area, VTI                   1.13  cm^2     ----------  Aortic valve area/bsa, VTI               0.48  cm^2/m^2 ----------  Velocity ratio, peak, LVOT/AV            0.39           ----------  Aortic valve area, peak velocity         1      cm^2     ----------  Aortic valve area/bsa, peak              0.43  cm^2/m^2 ----------  velocity  Velocity ratio, mean, LVOT/AV            0.45           ----------  Aortic valve area, mean velocity         1.15  cm^2     ----------  Aortic valve area/bsa, mean              0.49  cm^2/m^2 ----------  velocity    Aorta                                    Value          Reference  Aortic root ID, ED                       30    mm       ----------    Left atrium                              Value          Reference  LA ID, A-P, ES                           43    mm       ----------  LA ID/bsa, A-P                           1.83  cm/m^2   <=2.2  LA volume, ES, 1-p A4C                   73.7  ml       ----------  LA volume/bsa, ES, 1-p A4C               31.4  ml/m^2   ----------    Mitral valve                             Value          Reference  Mitral E-wave peak velocity              129   cm/s     ----------  Mitral A-wave peak velocity              121   cm/s     ----------  Mitral deceleration time          (H)    342   ms       150 - 230  Mitral peak gradient, D                  7     mm Hg    ----------  Mitral E/A ratio, peak                   1.1            ----------    Right atrium                             Value          Reference  RA ID, S-I, ES, A4C               (H)    50.9  mm       34 - 49  RA area, ES, A4C                         16.9  cm^2     8.3 - 19.5  RA volume, ES, A/L                       45.7  ml       ----------  RA volume/bsa, ES, A/L                   19.5  ml/m^2   ----------    Systemic veins                           Value          Reference  Estimated CVP                            3     mm Hg    ----------    Right ventricle                          Value          Reference  TAPSE                                    10.2  mm       ----------  RV s&', lateral, S  6.05  cm/s     ----------  Legend: (L)  and  (H)  mark  values outside specified reference range.  ------------------------------------------------------------------- Prepared and Electronically Authenticated by  Lyman Bishop MD 2018-09-26T09:46:08   Impression:  Patient is doing well approximately 3 months status post aortic valve replacement using a mechanical prosthetic valve and single-vessel coronary artery bypass grafting.  Plan:  I have encouraged the patient to continue to gradually increase his physical activity as tolerated without any particular limitations at this time.  We have not recommended any changes to his current medications.  The patient has been reminded regarding the importance of dental hygiene and the lifelong need for antibiotic prophylaxis for all dental cleanings and other related invasive procedures.  I spent in excess of 15 minutes during the conduct of this office consultation and >50% of this time involved direct face-to-face encounter with the patient for counseling and/or coordination of their care.    Valentina Gu. Roxy Manns, MD 03/10/2017 1:43 PM

## 2017-03-10 NOTE — Patient Instructions (Addendum)

## 2017-03-11 ENCOUNTER — Telehealth: Payer: Self-pay

## 2017-03-11 DIAGNOSIS — Z7901 Long term (current) use of anticoagulants: Secondary | ICD-10-CM | POA: Diagnosis not present

## 2017-03-11 DIAGNOSIS — E785 Hyperlipidemia, unspecified: Secondary | ICD-10-CM

## 2017-03-11 MED ORDER — ATORVASTATIN CALCIUM 80 MG PO TABS: 80.0000 mg | ORAL_TABLET | Freq: Every day | ORAL | 1 refills | Status: DC

## 2017-03-11 NOTE — Telephone Encounter (Signed)
Informed patient's spouse (DPR on file), per Dr. Radford Pax patient is on maximum dose of simvastatin. Informed her that I will send prescription for Lipitor for 30 days to see if patient is able to tolerate it and patient is scheduled for FLP and ALT on 04/23/17. She is in agreement with plan and thanked me for the call.   Notes recorded by Sueanne Margarita, MD on 03/10/2017 at 6:56 PM EST Not at goal - please change simvastatin to lipitor 80mg  daily and repeat FLP and ALT in 6 weeks

## 2017-03-26 DIAGNOSIS — N200 Calculus of kidney: Secondary | ICD-10-CM | POA: Diagnosis not present

## 2017-03-26 DIAGNOSIS — Z7901 Long term (current) use of anticoagulants: Secondary | ICD-10-CM | POA: Diagnosis not present

## 2017-03-26 DIAGNOSIS — C641 Malignant neoplasm of right kidney, except renal pelvis: Secondary | ICD-10-CM | POA: Diagnosis not present

## 2017-04-01 DIAGNOSIS — R932 Abnormal findings on diagnostic imaging of liver and biliary tract: Secondary | ICD-10-CM | POA: Diagnosis not present

## 2017-04-01 DIAGNOSIS — C641 Malignant neoplasm of right kidney, except renal pelvis: Secondary | ICD-10-CM | POA: Diagnosis not present

## 2017-04-16 DIAGNOSIS — Z7901 Long term (current) use of anticoagulants: Secondary | ICD-10-CM | POA: Diagnosis not present

## 2017-04-23 ENCOUNTER — Other Ambulatory Visit: Payer: BLUE CROSS/BLUE SHIELD | Admitting: *Deleted

## 2017-04-23 DIAGNOSIS — E785 Hyperlipidemia, unspecified: Secondary | ICD-10-CM | POA: Diagnosis not present

## 2017-04-23 LAB — ALT: ALT: 19 IU/L (ref 0–44)

## 2017-04-23 LAB — LIPID PANEL
CHOL/HDL RATIO: 3.6 ratio (ref 0.0–5.0)
Cholesterol, Total: 116 mg/dL (ref 100–199)
HDL: 32 mg/dL — ABNORMAL LOW (ref >39)
LDL CALC: 68 mg/dL (ref 0–99)
Triglycerides: 79 mg/dL (ref 0–149)
VLDL Cholesterol Cal: 16 mg/dL (ref 5–40)

## 2017-05-19 ENCOUNTER — Other Ambulatory Visit: Payer: Self-pay | Admitting: Cardiology

## 2017-05-19 MED ORDER — ATORVASTATIN CALCIUM 80 MG PO TABS: 80.0000 mg | ORAL_TABLET | Freq: Every day | ORAL | 10 refills | Status: DC

## 2017-06-11 DIAGNOSIS — Z952 Presence of prosthetic heart valve: Secondary | ICD-10-CM | POA: Diagnosis not present

## 2017-06-11 DIAGNOSIS — Z0000 Encounter for general adult medical examination without abnormal findings: Secondary | ICD-10-CM | POA: Diagnosis not present

## 2017-06-11 DIAGNOSIS — E782 Mixed hyperlipidemia: Secondary | ICD-10-CM | POA: Diagnosis not present

## 2017-06-11 DIAGNOSIS — I10 Essential (primary) hypertension: Secondary | ICD-10-CM | POA: Diagnosis not present

## 2017-09-01 ENCOUNTER — Ambulatory Visit: Payer: Self-pay | Admitting: Pharmacist

## 2017-09-09 ENCOUNTER — Telehealth: Payer: Self-pay | Admitting: *Deleted

## 2017-09-09 ENCOUNTER — Telehealth: Payer: Self-pay | Admitting: Cardiology

## 2017-09-09 NOTE — Telephone Encounter (Signed)
Fax received from [facility] requesting refills on metoprolol and quinapril for the patient. These medications are not listed on patients current med list but it is indicated on the fax that the patient last refilled them on 06/02/17 for #[id number]. Please advise. Thanks, MI

## 2017-09-09 NOTE — Telephone Encounter (Signed)
New Message     *STAT* If patient is at the pharmacy, call can be transferred to refill team.   1. Which medications need to be refilled? (please list name of each medication and dose if known)  atorvastatin (LIPITOR) 80 MG tablet Take 1 tablet (80 mg total) by mouth daily.        2. Which pharmacy/location (including street and city if local pharmacy) is medication to be sent to? [facility]  3. Do they need a 30 day or 90 day supply?  [place]

## 2017-09-09 NOTE — Telephone Encounter (Signed)
I spoke with patient's spouse (dpr on file) She states the quinapril is refilled by Dr. Rex Kras and patient is no longer taking metoprolol. She states patient is good on all his medication and she will call pharmacy to follow up. She verbalized understanding and thankful for the call

## 2017-09-09 NOTE — Telephone Encounter (Signed)
Patient has refills at his pharmacy. I spoke with patient and made him aware of this. He will contact them and call the office back if anything further is needed.

## 2017-09-18 DIAGNOSIS — R05 Cough: Secondary | ICD-10-CM | POA: Diagnosis not present

## 2017-09-18 DIAGNOSIS — Z7901 Long term (current) use of anticoagulants: Secondary | ICD-10-CM | POA: Diagnosis not present

## 2017-09-26 ENCOUNTER — Encounter: Payer: Self-pay | Admitting: Cardiology

## 2017-10-09 ENCOUNTER — Encounter: Payer: Self-pay | Admitting: Cardiology

## 2017-10-09 ENCOUNTER — Encounter (INDEPENDENT_AMBULATORY_CARE_PROVIDER_SITE_OTHER): Payer: Self-pay

## 2017-10-09 ENCOUNTER — Encounter: Payer: Self-pay | Admitting: *Deleted

## 2017-10-09 ENCOUNTER — Ambulatory Visit (INDEPENDENT_AMBULATORY_CARE_PROVIDER_SITE_OTHER): Payer: BLUE CROSS/BLUE SHIELD | Admitting: Cardiology

## 2017-10-09 VITALS — BP 120/86 | HR 71 | Ht 69.0 in | Wt 258.0 lb

## 2017-10-09 DIAGNOSIS — I10 Essential (primary) hypertension: Secondary | ICD-10-CM

## 2017-10-09 DIAGNOSIS — I350 Nonrheumatic aortic (valve) stenosis: Secondary | ICD-10-CM

## 2017-10-09 DIAGNOSIS — I4510 Unspecified right bundle-branch block: Secondary | ICD-10-CM

## 2017-10-09 DIAGNOSIS — E785 Hyperlipidemia, unspecified: Secondary | ICD-10-CM | POA: Diagnosis not present

## 2017-10-09 DIAGNOSIS — I2510 Atherosclerotic heart disease of native coronary artery without angina pectoris: Secondary | ICD-10-CM | POA: Diagnosis not present

## 2017-10-09 HISTORY — DX: Unspecified right bundle-branch block: I45.10

## 2017-10-09 MED ORDER — LOSARTAN POTASSIUM 100 MG PO TABS: 100.0000 mg | ORAL_TABLET | Freq: Every day | ORAL | 3 refills | Status: DC

## 2017-10-09 NOTE — Patient Instructions (Signed)
Medication Instructions:   STOP TAKING QUINAPRIL NOW  START TAKING LOSARTAN 100 MG ONCE DAILY     Testing/Procedures:  Your physician has requested that you have an echocardiogram. Echocardiography is a painless test that uses sound waves to create images of your heart. It provides your doctor with information about the size and shape of your heart and how well your heart's chambers and valves are working. This procedure takes approximately one hour. There are no restrictions for this procedure.   Your physician has requested that you have a lexiscan myoview. For further information please visit [email or web address]. Please follow instruction sheet, as given.     Follow-Up:  Your physician wants you to follow-up in: [place] will receive a reminder letter in the mail two months in advance. If you don't receive a letter, please call our office to schedule the follow-up appointment.        If you need a refill on your cardiac medications before your next appointment, please call your pharmacy.

## 2017-10-09 NOTE — Progress Notes (Signed)
Cardiology Office Note:    Date:  10/09/2017   [name], DOB December 03, 1960, MRN [id number]  PCP:  Michael Fess, MD  Cardiologist:  No primary care provider on file.    Referring MD: Michael Fess, MD   Chief Complaint  Patient presents with  . Coronary Artery Disease  . Hypertension  . Hyperlipidemia  . Aortic Stenosis    History of Present Illness:    Michael Delgado is a 57 y.o. male with a hx of severe aortic stenosis status post mechanical aortic valve replacement, CAD status post one-vessel CABG with an SVG to the distal RCA both done on 11/29/2016.  Intraoperative TEE showed normal LV function EF 55-60%.  This was complicated by postoperative atrial fibrillation.  He has not had any further arrhythmias since then.  He is now off amiodarone.  He also has a history of hypertension, hyperlipidemia and lupus anticoagulant on long-term warfarin anticoagulation.  He has remote history of Hodgkin's lymphoma status post chemotherapy  with mantle radiation therapy to the chest.  He is here today for followup and is doing well.  He denies any chest pain or pressure, PND, orthopnea,  dizziness, palpitations or syncope.  He occasionally has dyspnea on exertion if he really exerts himself.  He does have a diagnosis of COPD.  He is using inhaler now.  He also complains of occasional lower extremity edema at night after standing on his feet for long periods of time.  He had some recent leg cramps after taking some steroids but that has resolved.  He is compliant with his meds and is tolerating meds with no SE.    Past Medical History:  Diagnosis Date  . Blood dyscrasia    lupus anti coagulant  . Cancer ([place]) 08/2016   renal   . Chronic anticoagulation    INR goal 2.5-3.5  . COPD (chronic obstructive pulmonary disease) ([place])   . Coronary artery disease involving native coronary artery of native heart without angina pectoris 2018   s/p SVG to RCA  . Erectile dysfunction   . GERD  (gastroesophageal reflux disease)   . History of bowel infarction   . Hx of Hodgkin's lymphoma   . Hx of lupus anticoagulant disorder    history of blood work showing lupus  . Hyperlipidemia   . Hypertension   . Hypothyroidism   . Pneumonia    hx  . RBBB 10/09/2017  . S/P aortic valve replacement with mechanical valve 11/29/2016   23 mm Sorin Carbomedics top hat bileaflet mechanical valve  . S/P CABG x 1 11/29/2016  . Severe aortic stenosis   . Thyroid disease   . Tobacco dependency     Past Surgical History:  Procedure Laterality Date  . ABDOMINAL SURGERY  1999  . AORTIC VALVE REPLACEMENT N/A 11/29/2016   Procedure: AORTIC VALVE REPLACEMENT (AVR) using 23MM Carbonmedics Valve;  Surgeon: Rexene Alberts, MD;  Location: [facility];  Service: Open Heart Surgery;  Laterality: N/A;  . BOWEL INFARCTION  1997   HAD SURGERY AND FOUND TO HAVE LUPUS ANTICOAGULANT  . CORONARY ARTERY BYPASS GRAFT N/A 11/29/2016   Procedure: CORONARY ARTERY BYPASS GRAFTING (CABG) x one, using right leg greater saphenous vein harvested endoscopically;  Surgeon: Rexene Alberts, MD;  Location: [facility];  Service: Open Heart Surgery;  Laterality: N/A;  . HODGKIN  LYMPHOMA WITH RADIATION AND CHEMOTHERAPY  1984  . RIGHT/LEFT HEART CATH AND CORONARY ANGIOGRAPHY N/A 10/23/2016   Procedure: Right/Left Heart Cath and  Coronary Angiography;  Surgeon: Troy Sine, MD;  Location: [facility];  Service: Cardiovascular;  Laterality: N/A;  . ROBOTIC ASSITED PARTIAL NEPHRECTOMY Right 09/11/2016   Procedure: XI ROBOTIC ASSISTED RETROPERITONEAL PARTIAL NEPHRECTOMY;  Surgeon: Alexis Frock, MD;  Location: [facility];  Service: Urology;  Laterality: Right;  . TEE WITHOUT CARDIOVERSION N/A 11/29/2016   Procedure: TRANSESOPHAGEAL ECHOCARDIOGRAM (TEE);  Surgeon: Rexene Alberts, MD;  Location: [facility];  Service: Open Heart Surgery;  Laterality: N/A;  . THORACIC AORTOGRAM N/A 10/23/2016   Procedure: Thoracic Aortogram;  Surgeon: Troy Sine, MD;  Location: [facility];  Service: Cardiovascular;  Laterality: N/A;    Current Medications: Current Meds  Medication Sig  . aspirin EC 81 MG tablet Take 81 mg daily by mouth.  Marland Kitchen atorvastatin (LIPITOR) 80 MG tablet Take 1 tablet (80 mg total) by mouth daily.  . budesonide-formoterol (SYMBICORT) 80-4.5 MCG/ACT inhaler Inhale 1 puff into the lungs at bedtime as needed (shortness of breath).  . co-enzyme Q-10 30 MG capsule Take 30 mg daily by mouth.  . Cyanocobalamin (VITAMIN B12 PO) Take by mouth as directed.  . diphenhydramine-acetaminophen (TYLENOL PM) 25-500 MG TABS tablet Take 1 tablet by mouth at bedtime.  Marland Kitchen levothyroxine (SYNTHROID, LEVOTHROID) 100 MCG tablet Take 1 tablet (100 mcg total) by mouth daily before breakfast.  . metoprolol tartrate (LOPRESSOR) 50 MG tablet Take 1 tablet by mouth 2 (two) times daily.   . Omega-3 Fatty Acids (FISH OIL PO) Take 3 capsules by mouth at bedtime. 1200/360 MG  . predniSONE (DELTASONE) 20 MG tablet Take by mouth as directed.  Marland Kitchen PROAIR HFA 108 (90 Base) MCG/ACT inhaler Inhale 2 puffs into the lungs daily as needed. AS NEEDED FOR COPD  . quinapril (ACCUPRIL) 20 MG tablet Take 20 mg by mouth 2 (two) times daily.  Marland Kitchen VITAMIN E PO Take by mouth as directed.  . warfarin (COUMADIN) 5 MG tablet Take 1 tablet (5 mg total) by mouth daily at 6 PM. Or as directed     Allergies:   Patient has no known allergies.   Social History   Socioeconomic History  . Marital status: Married    Spouse name: Not on file  . Number of children: Not on file  . Years of education: Not on file  . Highest education level: Not on file  Occupational History  . Not on file  Social Needs  . Financial resource strain: Not on file  . Food insecurity:    Worry: Not on file    Inability: Not on file  . Transportation needs:    Medical: Not on file    Non-medical: Not on file  Tobacco Use  . Smoking status: Former Smoker    Last attempt to quit: 06/12/2014     Years since quitting: 3.3  . Smokeless tobacco: Never Used  Substance and Sexual Activity  . Alcohol use: No  . Drug use: No  . Sexual activity: Not on file  Lifestyle  . Physical activity:    Days per week: Not on file    Minutes per session: Not on file  . Stress: Not on file  Relationships  . Social connections:    Talks on phone: Not on file    Gets together: Not on file    Attends religious service: Not on file    Active member of club or organization: Not on file    Attends meetings of clubs or organizations: Not on file  Relationship status: Not on file  Other Topics Concern  . Not on file  Social History Narrative  . Not on file     Family History: The patient's family history includes Arthritis in his father, mother, and sister; Asthma in his sister; Cancer - Ovarian in his mother; Diabetes in his father; Healthy in his brother; Heart disease in his brother and sister; High Cholesterol in his father and mother; Hypertension in his brother, father, and mother.  ROS:   Please see the history of present illness.    ROS  All other systems reviewed and negative.   EKGs/Labs/Other Studies Reviewed:    The following studies were reviewed today: none  EKG:  EKG is  ordered today.  The ekg ordered today demonstrates normal sinus rhythm with first-degree AV block at 70 bpm with right bundle branch block and inferior infarct.  Recent Labs: 12/02/2016: TSH 11.678 12/05/2016: Magnesium 2.1 01/05/2017: BUN 14; Creatinine, Ser 0.97; Hemoglobin 10.9; Platelets 307; Potassium 4.0; Sodium 133 04/23/2017: ALT 19   Recent Lipid Panel    Component Value Date/Time   CHOL 116 04/23/2017 0741   TRIG 79 04/23/2017 0741   HDL 32 (L) 04/23/2017 0741   CHOLHDL 3.6 04/23/2017 0741   LDLCALC 68 04/23/2017 0741    Physical Exam:    VS:  BP 120/86   Pulse 71   Ht 5\' 9"  (1.753 m)   Wt 258 lb (117 kg)   SpO2 96%   BMI 38.10 kg/m     Wt Readings from Last 3 Encounters:    10/09/17 258 lb (117 kg)  03/10/17 242 lb (109.8 kg)  02/25/17 252 lb 9.6 oz (114.6 kg)     GEN:  Well nourished, well developed in no acute distress HEENT: Normal NECK: No JVD; No carotid bruits LYMPHATICS: No lymphadenopathy CARDIAC: RRR, no  Gallops.  Crisp mechanical heart sounds are present.  There is a possible pericardial friction rub at the left lower sternal border. RESPIRATORY:  Clear to auscultation without rales, wheezing or rhonchi  ABDOMEN: Soft, non-tender, non-distended MUSCULOSKELETAL:  No edema; No deformity  SKIN: Warm and dry NEUROLOGIC:  Alert and oriented x 3 PSYCHIATRIC:  Normal affect   ASSESSMENT:    1. Coronary artery disease involving native coronary artery of native heart without angina pectoris   2. Nonrheumatic aortic valve stenosis   3. Essential hypertension   4. Dyslipidemia   5. RBBB    PLAN:    In order of problems listed above:  1.  ASCAD  - status post one-vessel CABG with an SVG to the distal RCA done on 11/29/2016.  He denies any anginal symptoms.  He does have a new right bundle branch block on exam today.  I recommended getting a nuclear stress test to rule out ischemia and make sure he has not had graft failure.  He will continue on aspirin 81 mg daily, Lopressor and statin.  2.  Severe AS -status post mechanical AVR at the time of his CABG.  Continue on warfarin and aspirin 81 mg daily.  He does have a heart murmur on exam today and a possible rub.  I am going to get a 2D echocardiogram to reassess this.  3.  Hypertension -he is well controlled on exam today he is developed a cough.  He says he was diagnosed with COPD but the cough is really bothersome.  I am going to change his quinapril to losartan 100 mg daily and see if the cough improved  some.  He was taking quinapril 20 mg twice daily.Marland Kitchen  He will continue on Lopressor 50 mg twice daily  4.  Hyperlipidemia -LDL goal is less than 70.  LDL was 67 on 06/11/2017 and ALT was normal at  23.  5.  Right bundle branch block -is new on EKG today.  He is completely asymptomatic.  See above #1  6.  Leg cramps -he gets these intermittently throughout the day and thinks is from standing on his feet all day.  He has good distal pulses that are symmetrical.  He says the leg cramps of improved after starting magnesium.   Medication Adjustments/Labs and Tests Ordered: Current medicines are reviewed at length with the patient today.  Concerns regarding medicines are outlined above.  Orders Placed This Encounter  Procedures  . EKG 12-Lead   No orders of the defined types were placed in this encounter.   Signed, Fransico Him, MD  10/09/2017 8:45 AM    [place]

## 2017-10-14 ENCOUNTER — Telehealth (HOSPITAL_COMMUNITY): Payer: Self-pay | Admitting: *Deleted

## 2017-10-14 NOTE — Telephone Encounter (Signed)
Left message on voicemail per DPR in reference to upcoming appointment scheduled on 10/17/17 at 0715 with detailed instructions given per Myocardial Perfusion Study Information Sheet for the test. LM to arrive 15 minutes early, and that it is imperative to arrive on time for appointment to keep from having the test rescheduled. If you need to cancel or reschedule your appointment, please call the office within 24 hours of your appointment. Failure to do so may result in a cancellation of your appointment, and a $50 no show fee. Phone number given for call back for any questions. Charisa Twitty, [place]

## 2017-10-17 ENCOUNTER — Other Ambulatory Visit (HOSPITAL_COMMUNITY): Payer: BLUE CROSS/BLUE SHIELD

## 2017-10-17 ENCOUNTER — Encounter (HOSPITAL_COMMUNITY): Payer: BLUE CROSS/BLUE SHIELD

## 2017-11-14 IMAGING — CR DG CHEST 2V
2 series · 2 of 2 positions shown · non-contrast
Comparison: None.

CLINICAL DATA: Preop for cardiac valve replacement and coronary
artery bypass graft.

EXAM:
CHEST  2 VIEW

[w chest pa]
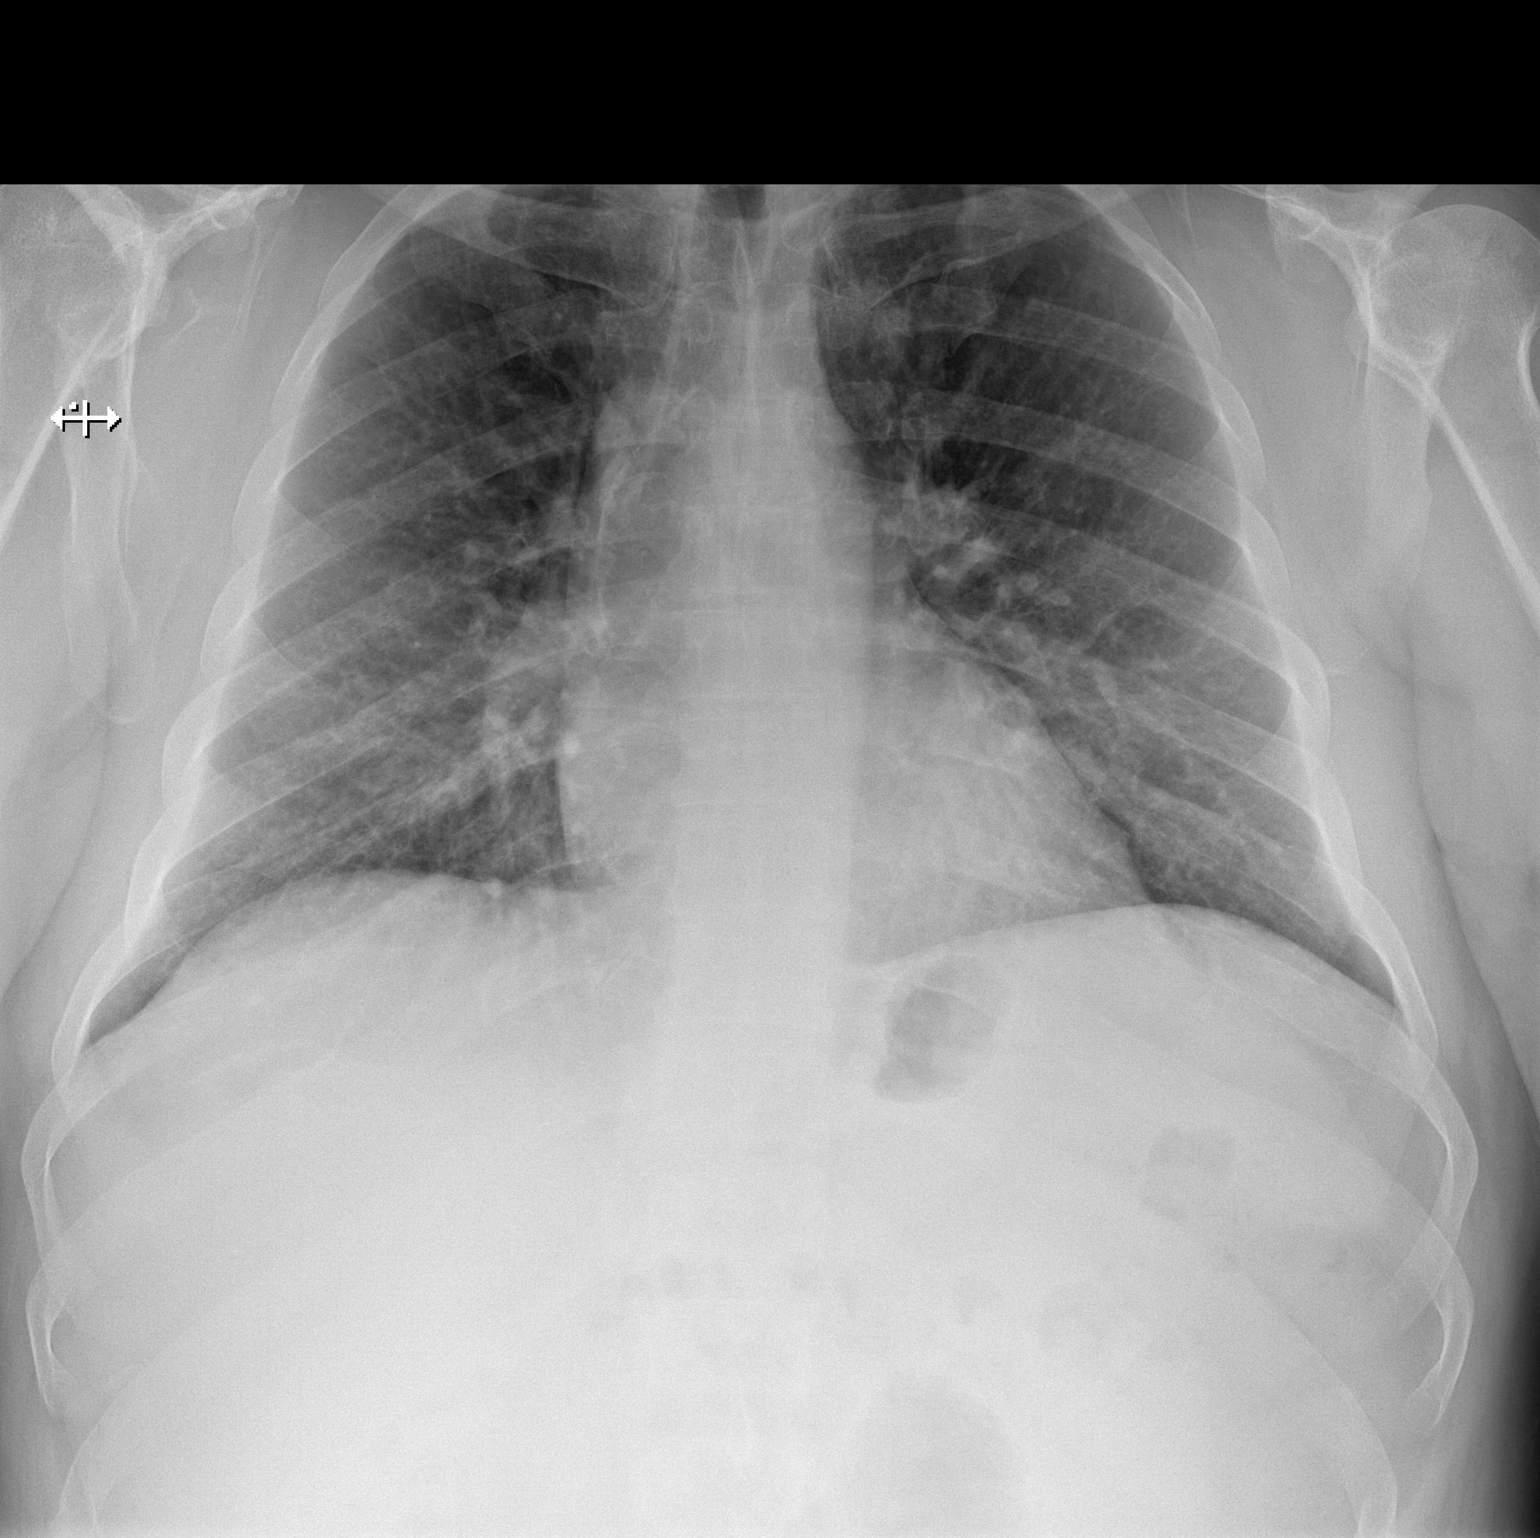

[w chest lat]
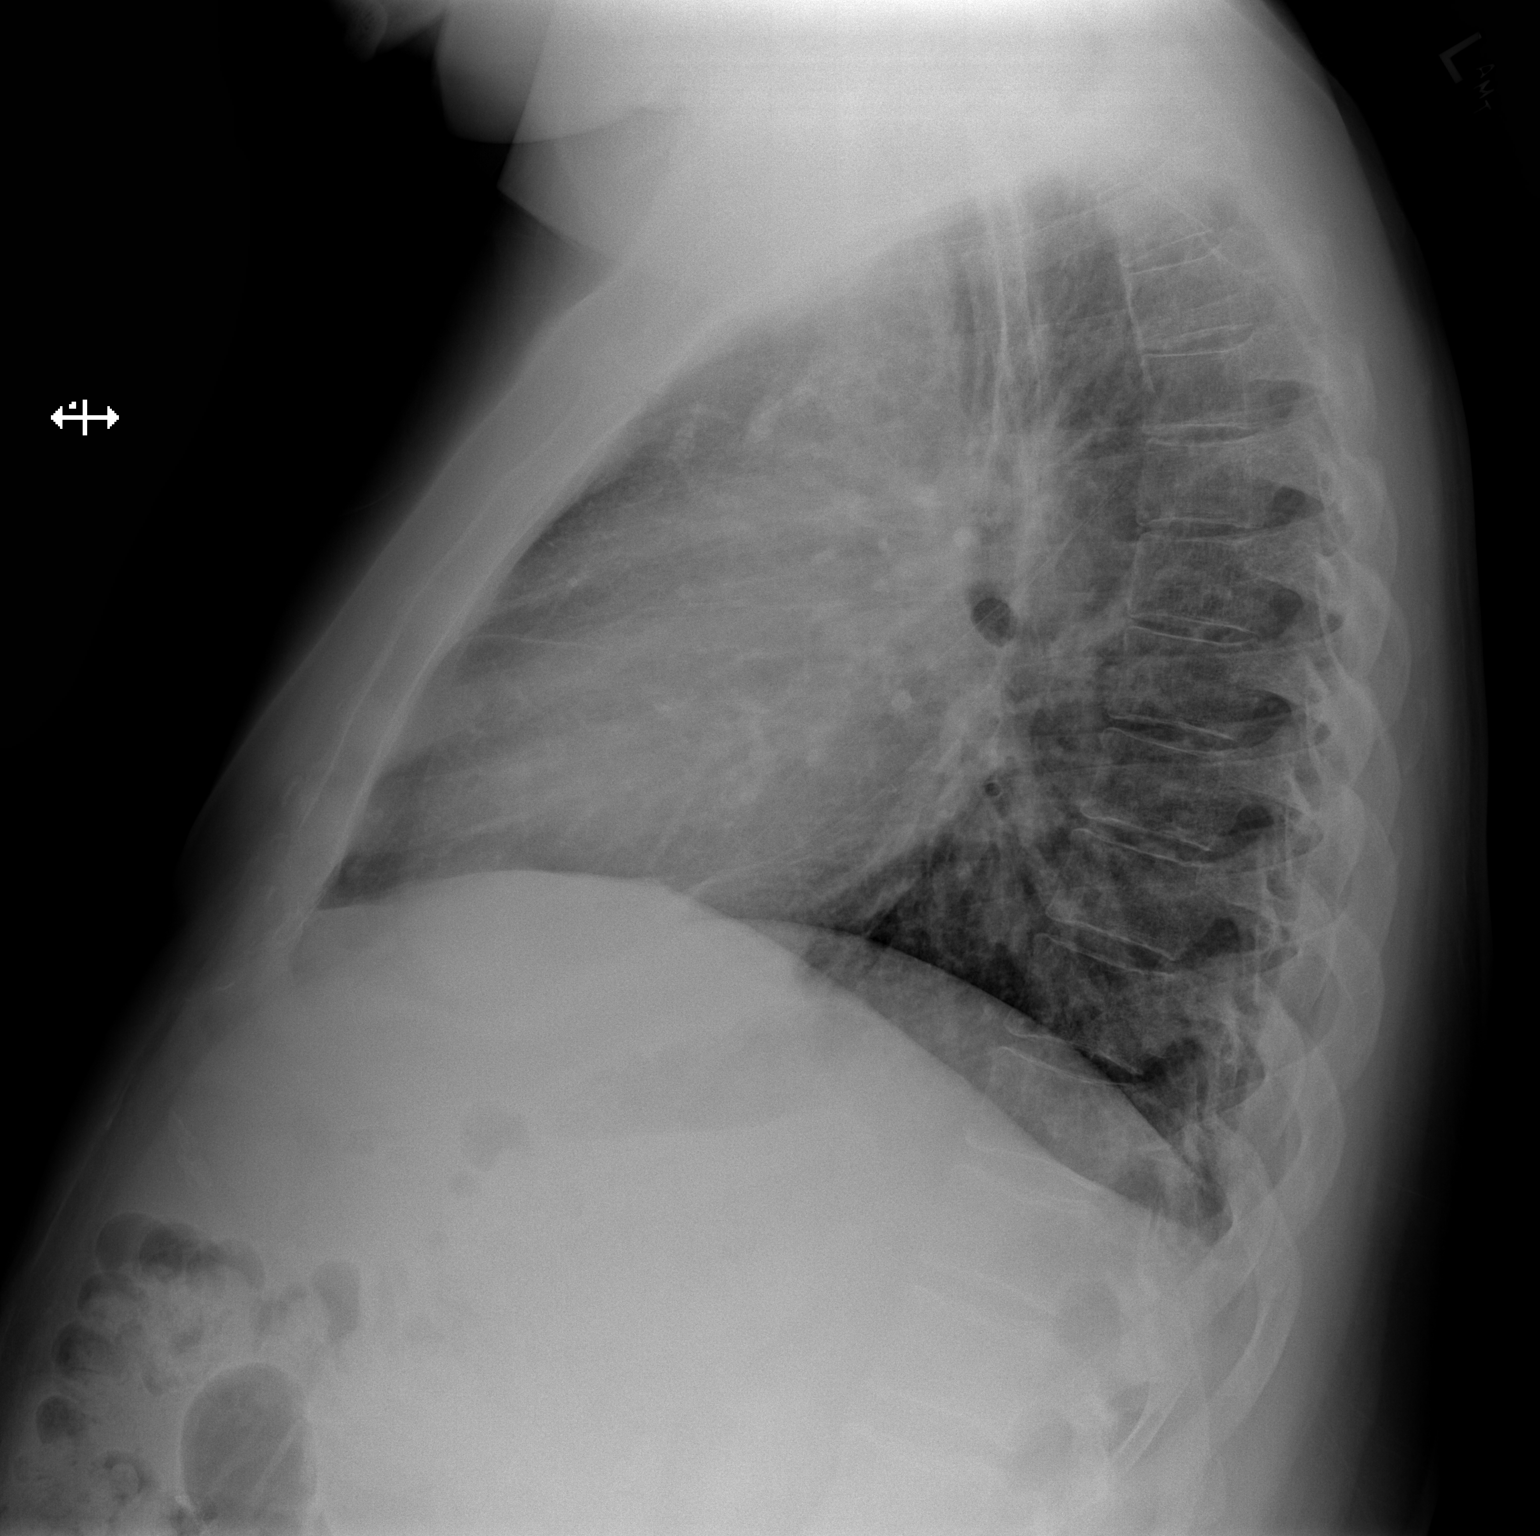

[2 of 2 positions shown; findings below may reference images not displayed]

FINDINGS: The heart size and mediastinal contours are within normal limits.
Both lungs are clear. No pneumothorax or pleural effusion is noted.
The visualized skeletal structures are unremarkable.
IMPRESSION: No active cardiopulmonary disease.

## 2017-11-19 IMAGING — CR DG CHEST 1V PORT
1 series · 1 of 1 positions shown · non-contrast
Comparison: 11/29/2016 and CT chest [REDACTED].

CLINICAL DATA: Atelectasis.

EXAM:
PORTABLE CHEST 1 VIEW

[AP]
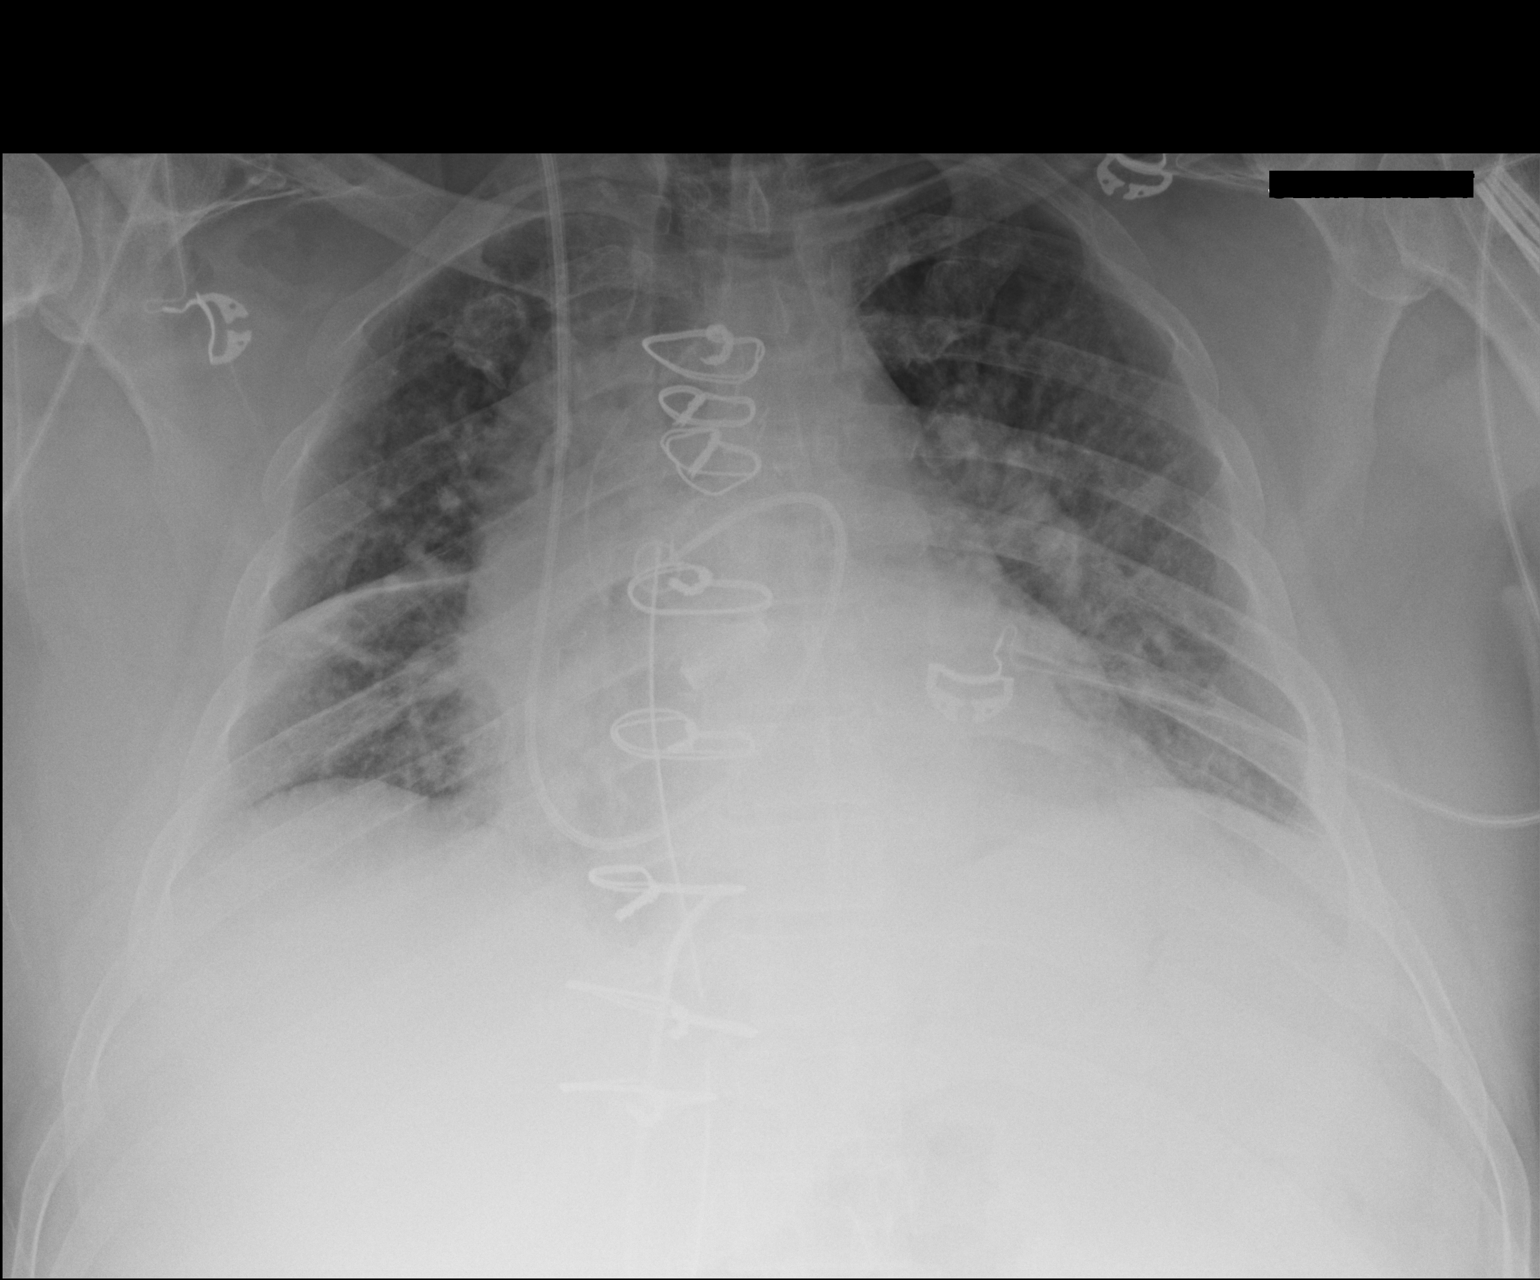

[1 of 1 positions shown; findings below may reference images not displayed]

FINDINGS: Interval extubation. Nasogastric tube has been removed. Right IJ
Swan-Ganz catheter tip projects over the right pulmonary artery.
Mediastinal drains remain in place.

Trachea is midline. Cardiomediastinal silhouette is enlarged, as
before. Low lung volumes with mild pulmonary vascular prominence.
Thickening along the minor fissure. No pneumothorax.
IMPRESSION: 1. Low lung volumes with central pulmonary can vascular congestion.
Difficult to exclude mild edema.
2. Atelectasis or fluid along the minor fissure.

## 2017-12-01 ENCOUNTER — Telehealth: Payer: Self-pay | Admitting: Cardiology

## 2017-12-01 DIAGNOSIS — I350 Nonrheumatic aortic (valve) stenosis: Secondary | ICD-10-CM

## 2017-12-01 NOTE — Telephone Encounter (Signed)
New Message:    Wife is calling saying that the two test that Dr Radford Pax wants the pt to have they can not afford at this time. Her question is, if there is  another option or another place that will not be that expensive.

## 2017-12-01 NOTE — Telephone Encounter (Signed)
Spoke to patient's wife about ordered tests per Dr Radford Pax (ECHO and Myoview Lexiscan Stress Test).  They had to cancel the 7/5 appt date because their out of pocket expense would be $1800.  They are requesting either other tests to be ordered or can they done at another facility.  Please advise, thank you

## 2017-12-01 NOTE — Telephone Encounter (Signed)
lpmtcb 8/19

## 2017-12-01 NOTE — Telephone Encounter (Signed)
Please have done at [facility] instead of the office

## 2017-12-02 NOTE — Telephone Encounter (Signed)
Cannot due stress echo due to RBBB.  Ok to do echo at different office.  Hospital can set up payment plan for nuclear stress test

## 2017-12-02 NOTE — Telephone Encounter (Signed)
Since he has not had any CP I am fine waiting until October when he has insurance to do stress test

## 2017-12-02 NOTE — Telephone Encounter (Signed)
Attempted to contact patient, but there was no answer and VM full.

## 2017-12-02 NOTE — Telephone Encounter (Signed)
Follow up     Patient is returning call in reference to test that is to be scheduled. She is also wanting the codes for the test that are to be done.

## 2017-12-02 NOTE — Telephone Encounter (Signed)
Spoke with Charmaine in billing and it will not be any cheaper for the patient to have echo and lexiscan done at [facility].   Per Charmaine, the lexiscan cannot be done cheaper.   The echo can be done at either [place], or [place] and will be cheaper because they are office based.   A stress echo can also be done at the 3 locations above for a cheaper price as well. Will forward to Dr. Radford Pax for review and recommendation.

## 2017-12-02 NOTE — Telephone Encounter (Signed)
Returned call to patient's wife (DPR on file). Let wife know that the patient can have echo done at either [place], [place], or [place] and it would be cheaper as they are office based. Wife agrees for patient to have echo in [place]. Order placed. She is aware that she will be contacted by [facility] to schedule.  Wife made aware that the patient can set up a payment plan through the hospital for the [place]. Wife states that she does not want to set up a payment plan. Wife requesting that the lexiscan be postponed until October when the patient's deductible restarts. Made wife aware that the reason that the test was ordered was because he had a new RBBB and Dr. Radford Pax wanted to r/o ischemia/graft failure. Wife states that she understands that, but she would like to proceed with the echo in [place] and wait for the lexiscan to be done in October. Patient is currently asymptomatic. She will let us know if the patient's Sx change or worsen before then.

## 2017-12-11 ENCOUNTER — Other Ambulatory Visit: Payer: Self-pay | Admitting: *Deleted

## 2017-12-11 DIAGNOSIS — S065X0A Traumatic subdural hemorrhage without loss of consciousness, initial encounter: Secondary | ICD-10-CM | POA: Diagnosis not present

## 2017-12-11 DIAGNOSIS — M542 Cervicalgia: Secondary | ICD-10-CM | POA: Diagnosis not present

## 2017-12-11 DIAGNOSIS — R079 Chest pain, unspecified: Secondary | ICD-10-CM | POA: Diagnosis not present

## 2017-12-11 DIAGNOSIS — Z952 Presence of prosthetic heart valve: Secondary | ICD-10-CM | POA: Diagnosis not present

## 2017-12-11 DIAGNOSIS — S199XXA Unspecified injury of neck, initial encounter: Secondary | ICD-10-CM | POA: Diagnosis not present

## 2017-12-11 DIAGNOSIS — S299XXA Unspecified injury of thorax, initial encounter: Secondary | ICD-10-CM | POA: Diagnosis not present

## 2017-12-11 DIAGNOSIS — R791 Abnormal coagulation profile: Secondary | ICD-10-CM | POA: Diagnosis not present

## 2017-12-11 DIAGNOSIS — M549 Dorsalgia, unspecified: Secondary | ICD-10-CM | POA: Diagnosis not present
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
## 2017-12-12 ENCOUNTER — Encounter (HOSPITAL_COMMUNITY): Payer: Self-pay | Admitting: Student

## 2017-12-12 ENCOUNTER — Observation Stay (HOSPITAL_COMMUNITY): Payer: BLUE CROSS/BLUE SHIELD

## 2017-12-12 ENCOUNTER — Observation Stay (HOSPITAL_COMMUNITY)
Admission: AD | Admit: 2017-12-12 | Discharge: 2017-12-12 | Disposition: A | Discharge status: Home / Self Care | Payer: BLUE CROSS/BLUE SHIELD | Source: Other Acute Inpatient Hospital | Attending: Internal Medicine | Admitting: Internal Medicine

## 2017-12-12 ENCOUNTER — Other Ambulatory Visit: Payer: Self-pay

## 2017-12-12 DIAGNOSIS — E039 Hypothyroidism, unspecified: Secondary | ICD-10-CM | POA: Diagnosis not present

## 2017-12-12 DIAGNOSIS — I2510 Atherosclerotic heart disease of native coronary artery without angina pectoris: Secondary | ICD-10-CM | POA: Insufficient documentation

## 2017-12-12 DIAGNOSIS — Z905 Acquired absence of kidney: Secondary | ICD-10-CM | POA: Insufficient documentation

## 2017-12-12 DIAGNOSIS — E079 Disorder of thyroid, unspecified: Secondary | ICD-10-CM | POA: Diagnosis not present

## 2017-12-12 DIAGNOSIS — Z923 Personal history of irradiation: Secondary | ICD-10-CM | POA: Insufficient documentation

## 2017-12-12 DIAGNOSIS — Y92410 Unspecified street and highway as the place of occurrence of the external cause: Secondary | ICD-10-CM | POA: Insufficient documentation

## 2017-12-12 DIAGNOSIS — Z7901 Long term (current) use of anticoagulants: Secondary | ICD-10-CM | POA: Diagnosis not present

## 2017-12-12 DIAGNOSIS — Z7952 Long term (current) use of systemic steroids: Secondary | ICD-10-CM | POA: Diagnosis not present

## 2017-12-12 DIAGNOSIS — S065XAA Traumatic subdural hemorrhage with loss of consciousness status unknown, initial encounter: Secondary | ICD-10-CM | POA: Diagnosis present

## 2017-12-12 DIAGNOSIS — Z952 Presence of prosthetic heart valve: Secondary | ICD-10-CM

## 2017-12-12 DIAGNOSIS — Z79899 Other long term (current) drug therapy: Secondary | ICD-10-CM | POA: Diagnosis not present

## 2017-12-12 DIAGNOSIS — I10 Essential (primary) hypertension: Secondary | ICD-10-CM | POA: Diagnosis present

## 2017-12-12 DIAGNOSIS — S065X9A Traumatic subdural hemorrhage with loss of consciousness of unspecified duration, initial encounter: Secondary | ICD-10-CM | POA: Diagnosis present

## 2017-12-12 DIAGNOSIS — Z951 Presence of aortocoronary bypass graft: Secondary | ICD-10-CM | POA: Diagnosis not present

## 2017-12-12 DIAGNOSIS — I4510 Unspecified right bundle-branch block: Secondary | ICD-10-CM | POA: Diagnosis present

## 2017-12-12 DIAGNOSIS — Z85528 Personal history of other malignant neoplasm of kidney: Secondary | ICD-10-CM | POA: Diagnosis not present

## 2017-12-12 DIAGNOSIS — J449 Chronic obstructive pulmonary disease, unspecified: Secondary | ICD-10-CM | POA: Insufficient documentation

## 2017-12-12 DIAGNOSIS — Z9221 Personal history of antineoplastic chemotherapy: Secondary | ICD-10-CM | POA: Diagnosis not present

## 2017-12-12 DIAGNOSIS — Z79890 Hormone replacement therapy (postmenopausal): Secondary | ICD-10-CM | POA: Diagnosis not present

## 2017-12-12 DIAGNOSIS — Z87891 Personal history of nicotine dependence: Secondary | ICD-10-CM | POA: Diagnosis not present

## 2017-12-12 DIAGNOSIS — Z7982 Long term (current) use of aspirin: Secondary | ICD-10-CM | POA: Diagnosis not present

## 2017-12-12 DIAGNOSIS — S065X0A Traumatic subdural hemorrhage without loss of consciousness, initial encounter: Principal | ICD-10-CM | POA: Insufficient documentation

## 2017-12-12 DIAGNOSIS — R000 Tachycardia, unspecified: Secondary | ICD-10-CM | POA: Diagnosis not present

## 2017-12-12 DIAGNOSIS — Z8572 Personal history of non-Hodgkin lymphomas: Secondary | ICD-10-CM | POA: Diagnosis not present

## 2017-12-12 DIAGNOSIS — E785 Hyperlipidemia, unspecified: Secondary | ICD-10-CM | POA: Diagnosis present

## 2017-12-12 DIAGNOSIS — D6862 Lupus anticoagulant syndrome: Secondary | ICD-10-CM | POA: Insufficient documentation

## 2017-12-12 LAB — CBC
HCT: 36.2 % — ABNORMAL LOW (ref 39.0–52.0)
HEMOGLOBIN: 11.8 g/dL — AB (ref 13.0–17.0)
MCH: 28.4 pg (ref 26.0–34.0)
MCHC: 32.6 g/dL (ref 30.0–36.0)
MCV: 87.2 fL (ref 78.0–100.0)
PLATELETS: 212 10*3/uL (ref 150–400)
RBC: 4.15 MIL/uL — ABNORMAL LOW (ref 4.22–5.81)
RDW: 13.4 % (ref 11.5–15.5)
WBC: 9.9 10*3/uL (ref 4.0–10.5)

## 2017-12-12 LAB — BASIC METABOLIC PANEL
Anion gap: 6 (ref 5–15)
BUN: 14 mg/dL (ref 6–20)
CALCIUM: 8.3 mg/dL — AB (ref 8.9–10.3)
CO2: 26 mmol/L (ref 22–32)
CREATININE: 0.84 mg/dL (ref 0.61–1.24)
Chloride: 106 mmol/L (ref 98–111)
GFR calc Af Amer: >60 mL/min (ref >60)
GLUCOSE: 117 mg/dL — AB (ref 70–99)
Potassium: 3.5 mmol/L (ref 3.5–5.1)
Sodium: 138 mmol/L (ref 135–145)

## 2017-12-12 LAB — PROTIME-INR
INR: 2.6
Prothrombin Time: 27.7 seconds — ABNORMAL HIGH (ref 11.4–15.2)

## 2017-12-12 MED ORDER — ATORVASTATIN CALCIUM 80 MG PO TABS
80.0000 mg | ORAL_TABLET | Freq: Every day | ORAL | Status: DC
  Administered 2017-12-12: 80 mg via ORAL
  Filled 2017-12-12: qty 1

## 2017-12-12 MED ORDER — ACETAMINOPHEN 650 MG RE SUPP: 650.0000 mg | Freq: Four times a day (QID) | RECTAL | Status: DC | PRN

## 2017-12-12 MED ORDER — HYDROCODONE-ACETAMINOPHEN 5-325 MG PO TABS
1.0000 | ORAL_TABLET | Freq: Four times a day (QID) | ORAL | Status: DC | PRN
  Administered 2017-12-12 (×2): 1 via ORAL
  Filled 2017-12-12 (×3): qty 1

## 2017-12-12 MED ORDER — LEVOTHYROXINE SODIUM 100 MCG PO TABS
100.0000 ug | ORAL_TABLET | Freq: Every day | ORAL | Status: DC
  Administered 2017-12-12: 100 ug via ORAL
  Filled 2017-12-12: qty 1

## 2017-12-12 MED ORDER — HYDRALAZINE HCL 20 MG/ML IJ SOLN: 10.0000 mg | INTRAMUSCULAR | Status: DC | PRN

## 2017-12-12 MED ORDER — SODIUM CHLORIDE 0.9% FLUSH
3.0000 mL | Freq: Two times a day (BID) | INTRAVENOUS | Status: DC
  Administered 2017-12-12: 3 mL via INTRAVENOUS

## 2017-12-12 MED ORDER — ONDANSETRON HCL 4 MG PO TABS: 4.0000 mg | ORAL_TABLET | Freq: Four times a day (QID) | ORAL | Status: DC | PRN

## 2017-12-12 MED ORDER — METOPROLOL TARTRATE 50 MG PO TABS
50.0000 mg | ORAL_TABLET | Freq: Two times a day (BID) | ORAL | Status: DC
  Administered 2017-12-12 (×2): 50 mg via ORAL
  Filled 2017-12-12 (×2): qty 1

## 2017-12-12 MED ORDER — ACETAMINOPHEN 325 MG PO TABS
650.0000 mg | ORAL_TABLET | Freq: Four times a day (QID) | ORAL | Status: DC | PRN
  Administered 2017-12-12 (×3): 650 mg via ORAL
  Filled 2017-12-12 (×3): qty 2

## 2017-12-12 MED ORDER — ONDANSETRON HCL 4 MG/2ML IJ SOLN: 4.0000 mg | Freq: Four times a day (QID) | INTRAMUSCULAR | Status: DC | PRN

## 2017-12-12 MED ORDER — HYDROCODONE-ACETAMINOPHEN 5-325 MG PO TABS: 1.0000 | ORAL_TABLET | Freq: Four times a day (QID) | ORAL | 0 refills | Status: DC | PRN

## 2017-12-12 MED ORDER — HYDROCODONE-ACETAMINOPHEN 5-325 MG PO TABS
1.0000 | ORAL_TABLET | ORAL | Status: AC
  Administered 2017-12-12: 1 via ORAL
  Filled 2017-12-12: qty 1

## 2017-12-12 MED ORDER — FLUTICASONE FUROATE-VILANTEROL 100-25 MCG/INH IN AEPB
1.0000 | INHALATION_SPRAY | Freq: Every day | RESPIRATORY_TRACT | Status: DC
  Administered 2017-12-12: 1 via RESPIRATORY_TRACT
  Filled 2017-12-12: qty 28

## 2017-12-12 MED ORDER — LOSARTAN POTASSIUM 50 MG PO TABS
100.0000 mg | ORAL_TABLET | Freq: Every day | ORAL | Status: DC
  Administered 2017-12-12: 100 mg via ORAL
  Filled 2017-12-12: qty 2

## 2017-12-12 MED ORDER — METHOCARBAMOL 500 MG PO TABS: 500.0000 mg | ORAL_TABLET | Freq: Three times a day (TID) | ORAL | 0 refills | Status: AC | PRN

## 2017-12-12 NOTE — Progress Notes (Signed)
CT head reviewed today. Clinically insignificant SDH is stable. Ok to anticoagulate in 10 days. Neurosurgery will sign off. Please call for any questions or concerns.
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
## 2017-12-12 NOTE — Progress Notes (Signed)
Patient's IV removed, educated on plan for next week and sent home via car with wife.

## 2017-12-12 NOTE — Progress Notes (Addendum)
Patient is on coumadin for both lupus anticoagulant (diagnosed years ago) and mechanical valve.  Coumadin is on hold per NS.  Repeat head CT Stable so comment needs to be made about how long off and risk discussed.   Hope for d/c later today Eulogio Bear DO  Discussed with NP, [place]-- ok to hold anticoagulation for 5 days not 10 due to above issues
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
## 2017-12-17 DIAGNOSIS — S065X9A Traumatic subdural hemorrhage with loss of consciousness of unspecified duration, initial encounter: Secondary | ICD-10-CM | POA: Diagnosis not present

## 2017-12-19 ENCOUNTER — Other Ambulatory Visit: Payer: Self-pay | Admitting: Family Medicine

## 2017-12-19 DIAGNOSIS — S065X9A Traumatic subdural hemorrhage with loss of consciousness of unspecified duration, initial encounter: Secondary | ICD-10-CM

## 2017-12-19 DIAGNOSIS — S065XAA Traumatic subdural hemorrhage with loss of consciousness status unknown, initial encounter: Secondary | ICD-10-CM

## 2017-12-22 ENCOUNTER — Ambulatory Visit
Admission: RE | Admit: 2017-12-22 | Discharge: 2017-12-22 | Disposition: A | Discharge status: Home / Self Care | Payer: BLUE CROSS/BLUE SHIELD | Source: Ambulatory Visit | Attending: Family Medicine | Admitting: Family Medicine

## 2017-12-22 ENCOUNTER — Encounter: Payer: Self-pay | Admitting: Thoracic Surgery (Cardiothoracic Vascular Surgery)

## 2017-12-22 ENCOUNTER — Other Ambulatory Visit: Payer: Self-pay

## 2017-12-22 ENCOUNTER — Ambulatory Visit: Payer: BLUE CROSS/BLUE SHIELD | Admitting: Thoracic Surgery (Cardiothoracic Vascular Surgery)

## 2017-12-22 VITALS — BP 135/82 | HR 74 | Resp 16 | Ht 69.0 in | Wt 265.0 lb

## 2017-12-22 DIAGNOSIS — Z951 Presence of aortocoronary bypass graft: Secondary | ICD-10-CM | POA: Diagnosis not present

## 2017-12-22 DIAGNOSIS — S065X9A Traumatic subdural hemorrhage with loss of consciousness of unspecified duration, initial encounter: Secondary | ICD-10-CM

## 2017-12-22 DIAGNOSIS — Z954 Presence of other heart-valve replacement: Secondary | ICD-10-CM | POA: Diagnosis not present

## 2017-12-22 DIAGNOSIS — S065XAA Traumatic subdural hemorrhage with loss of consciousness status unknown, initial encounter: Secondary | ICD-10-CM

## 2017-12-22 DIAGNOSIS — I6200 Nontraumatic subdural hemorrhage, unspecified: Secondary | ICD-10-CM | POA: Diagnosis not present

## 2017-12-22 DIAGNOSIS — Z7901 Long term (current) use of anticoagulants: Secondary | ICD-10-CM | POA: Diagnosis not present
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
## 2017-12-22 NOTE — Patient Instructions (Signed)

## 2017-12-25 IMAGING — DX DG CHEST 2V
2 series · 2 of 2 positions shown · non-contrast
Comparison: Chest radiograph December 17, 2016

CLINICAL DATA: Cough for 5 weeks after CABG. History of COPD and
CHF.

EXAM:
CHEST  2 VIEW

[chest pa]
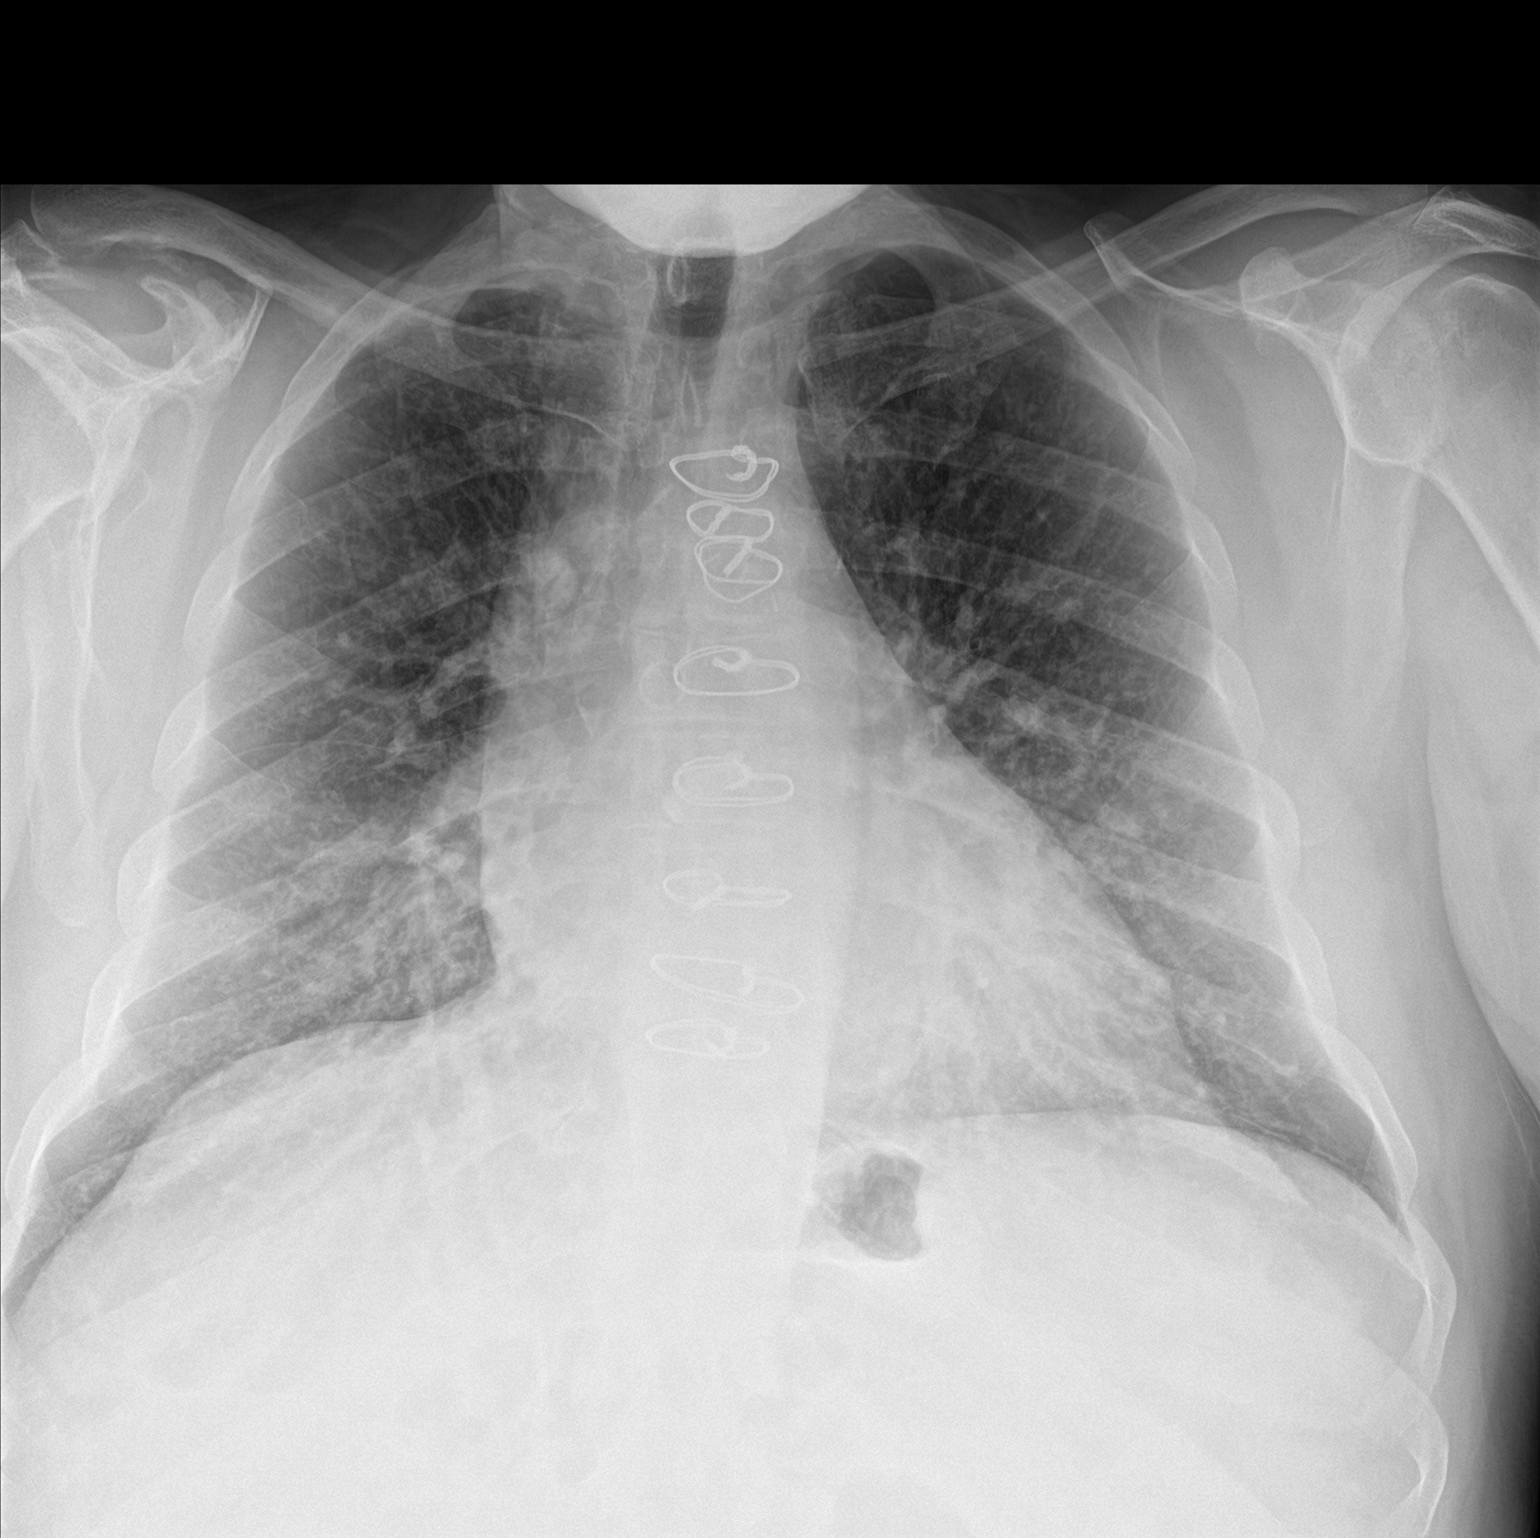

[chest lat]
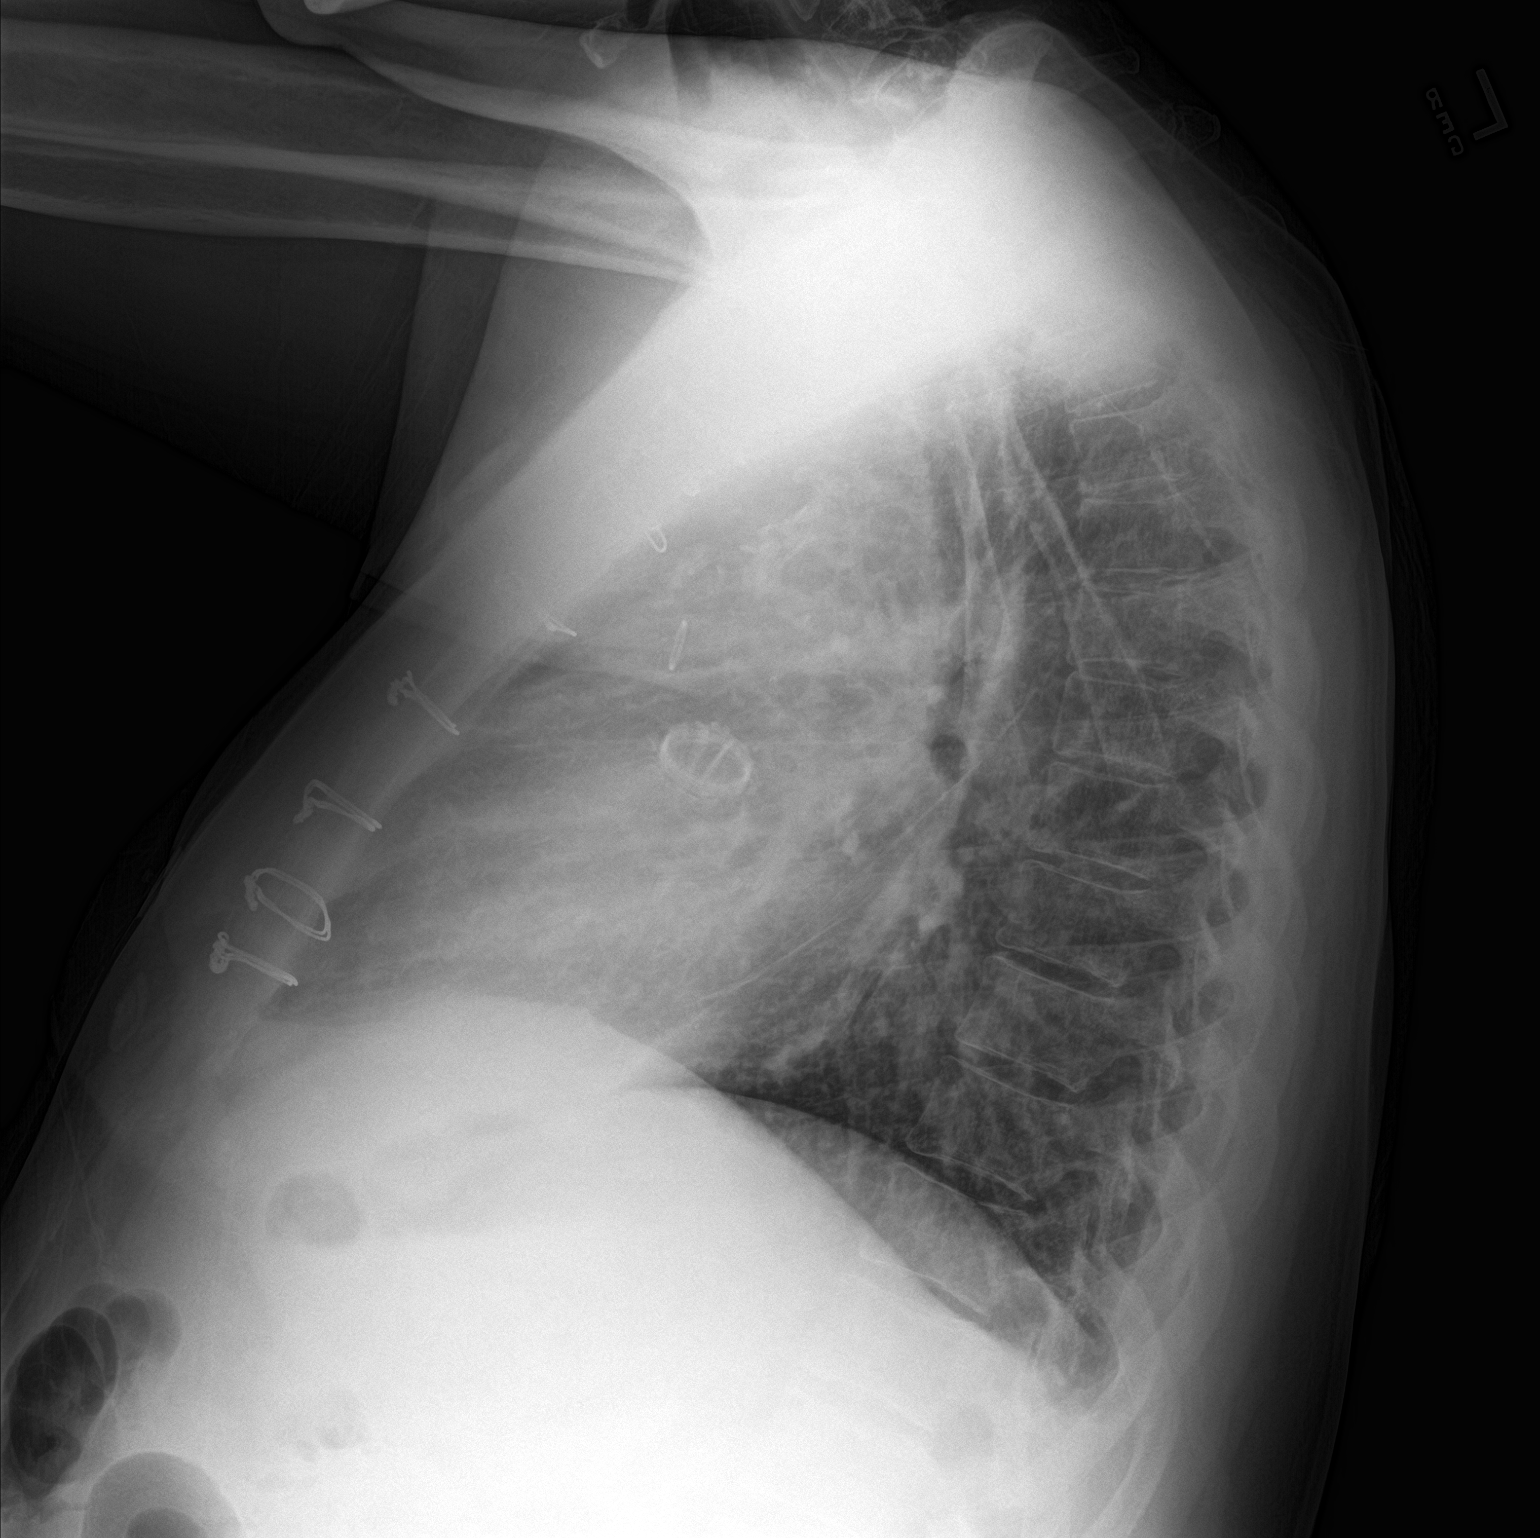

[2 of 2 positions shown; findings below may reference images not displayed]

FINDINGS: Cardiac silhouette is mildly enlarged. Status post median sternotomy
for CABG and pin cardiac valve replacement. Prominent
bronchovascular markings and mild interstitial prominence without
pleural effusion or focal consolidation. No pneumothorax. Soft
tissue planes and included osseous structures are nonsuspicious.
IMPRESSION: Mild cardiomegaly. Interstitial prominence seen with atypical
infection and, pulmonary edema.

## 2017-12-29 DIAGNOSIS — Z7901 Long term (current) use of anticoagulants: Secondary | ICD-10-CM | POA: Diagnosis not present

## 2017-12-31 DIAGNOSIS — M25569 Pain in unspecified knee: Secondary | ICD-10-CM | POA: Diagnosis not present

## 2018-01-12 DIAGNOSIS — M25561 Pain in right knee: Secondary | ICD-10-CM | POA: Diagnosis not present

## 2018-01-12 DIAGNOSIS — M545 Low back pain: Secondary | ICD-10-CM | POA: Diagnosis not present

## 2018-02-25 DIAGNOSIS — M25561 Pain in right knee: Secondary | ICD-10-CM | POA: Diagnosis not present

## 2018-02-25 DIAGNOSIS — M545 Low back pain: Secondary | ICD-10-CM | POA: Diagnosis not present

## 2018-02-25 DIAGNOSIS — M25551 Pain in right hip: Secondary | ICD-10-CM | POA: Diagnosis not present

## 2018-03-18 ENCOUNTER — Other Ambulatory Visit: Payer: Self-pay | Admitting: Cardiology

## 2018-03-18 MED ORDER — ATORVASTATIN CALCIUM 80 MG PO TABS: 80.0000 mg | ORAL_TABLET | Freq: Every day | ORAL | 6 refills | Status: DC

## 2018-04-20 DIAGNOSIS — R05 Cough: Secondary | ICD-10-CM | POA: Diagnosis not present

## 2018-04-20 DIAGNOSIS — R82998 Other abnormal findings in urine: Secondary | ICD-10-CM | POA: Diagnosis not present

## 2018-04-20 DIAGNOSIS — R509 Fever, unspecified: Secondary | ICD-10-CM | POA: Diagnosis not present

## 2018-04-20 DIAGNOSIS — M791 Myalgia, unspecified site: Secondary | ICD-10-CM | POA: Diagnosis not present

## 2018-04-23 DIAGNOSIS — R82998 Other abnormal findings in urine: Secondary | ICD-10-CM | POA: Diagnosis not present

## 2018-04-23 DIAGNOSIS — R05 Cough: Secondary | ICD-10-CM | POA: Diagnosis not present

## 2018-04-23 DIAGNOSIS — M791 Myalgia, unspecified site: Secondary | ICD-10-CM | POA: Diagnosis not present

## 2018-04-23 DIAGNOSIS — R509 Fever, unspecified: Secondary | ICD-10-CM | POA: Diagnosis not present

## 2018-05-27 DIAGNOSIS — M25561 Pain in right knee: Secondary | ICD-10-CM | POA: Diagnosis not present

## 2018-05-27 DIAGNOSIS — M25551 Pain in right hip: Secondary | ICD-10-CM | POA: Diagnosis not present

## 2018-05-28 ENCOUNTER — Telehealth: Payer: Self-pay

## 2018-05-28 NOTE — Telephone Encounter (Signed)
-----   Message from Rexene Alberts, MD sent at 05/28/2018  1:48 PM EST ----- Regarding: RE: MRI of Knee Contact: [phone] There are no contraindications to MRI ----- Message ----- From: Marylen Ponto, LPN Sent: 7/54/3606  12:48 PM EST To: Rexene Alberts, MD Subject: MRI of Knee                                    [facility] called to see if it would be ok for Michael Delgado to have a MRI of his knee. He had AVR / Sorin Carbomedics top hat bileaflet mechanical valve placed in 2018. Please advise. SW

## 2018-05-29 ENCOUNTER — Other Ambulatory Visit (HOSPITAL_COMMUNITY): Payer: Self-pay | Admitting: Orthopaedic Surgery

## 2018-05-29 DIAGNOSIS — M25561 Pain in right knee: Secondary | ICD-10-CM

## 2018-06-08 ENCOUNTER — Other Ambulatory Visit: Payer: Self-pay | Admitting: Orthopaedic Surgery

## 2018-06-08 DIAGNOSIS — M25561 Pain in right knee: Secondary | ICD-10-CM

## 2018-06-12 ENCOUNTER — Ambulatory Visit (HOSPITAL_COMMUNITY): Payer: BLUE CROSS/BLUE SHIELD

## 2018-06-12 ENCOUNTER — Encounter (HOSPITAL_COMMUNITY): Payer: Self-pay

## 2018-06-12 DIAGNOSIS — Z952 Presence of prosthetic heart valve: Secondary | ICD-10-CM | POA: Diagnosis not present

## 2018-06-12 DIAGNOSIS — I2510 Atherosclerotic heart disease of native coronary artery without angina pectoris: Secondary | ICD-10-CM | POA: Diagnosis not present

## 2018-06-12 DIAGNOSIS — E039 Hypothyroidism, unspecified: Secondary | ICD-10-CM | POA: Diagnosis not present

## 2018-06-12 DIAGNOSIS — I10 Essential (primary) hypertension: Secondary | ICD-10-CM | POA: Diagnosis not present

## 2018-06-12 DIAGNOSIS — Z0000 Encounter for general adult medical examination without abnormal findings: Secondary | ICD-10-CM | POA: Diagnosis not present

## 2018-06-12 DIAGNOSIS — L989 Disorder of the skin and subcutaneous tissue, unspecified: Secondary | ICD-10-CM | POA: Diagnosis not present

## 2018-06-14 ENCOUNTER — Ambulatory Visit
Admission: RE | Admit: 2018-06-14 | Discharge: 2018-06-14 | Disposition: A | Discharge status: Home / Self Care | Payer: BLUE CROSS/BLUE SHIELD | Source: Ambulatory Visit | Attending: Orthopaedic Surgery | Admitting: Orthopaedic Surgery

## 2018-06-14 DIAGNOSIS — M25561 Pain in right knee: Secondary | ICD-10-CM

## 2018-06-17 DIAGNOSIS — I8310 Varicose veins of unspecified lower extremity with inflammation: Secondary | ICD-10-CM | POA: Diagnosis not present

## 2018-06-17 DIAGNOSIS — L821 Other seborrheic keratosis: Secondary | ICD-10-CM | POA: Diagnosis not present

## 2018-06-17 DIAGNOSIS — L853 Xerosis cutis: Secondary | ICD-10-CM | POA: Diagnosis not present

## 2018-06-17 DIAGNOSIS — C44529 Squamous cell carcinoma of skin of other part of trunk: Secondary | ICD-10-CM | POA: Diagnosis not present

## 2018-06-26 DIAGNOSIS — M25551 Pain in right hip: Secondary | ICD-10-CM | POA: Diagnosis not present

## 2018-06-29 ENCOUNTER — Telehealth: Payer: Self-pay | Admitting: *Deleted

## 2018-06-29 MED ORDER — WARFARIN SODIUM 5 MG PO TABS: 5.0000 mg | ORAL_TABLET | Freq: Every day | ORAL | Status: AC

## 2018-06-29 NOTE — Telephone Encounter (Signed)
Yes I would still like it done prior to surgery but would postpone for now

## 2018-06-29 NOTE — Telephone Encounter (Signed)
   [facility] Pre-operative Risk Assessment    Request for surgical clearance:  1. What type of surgery is being performed? RIGHT KNEE ARTHOSCOPY   2. When is this surgery scheduled? TBD   3. What type of clearance is required (medical clearance vs. Pharmacy clearance to hold med vs. Both)? MEDICAL  4. Are there any medications that need to be held prior to surgery and how long?NO ANTICOAG LISTED   5. Practice name and name of physician performing surgery? [facility]; DR. DALLDORF   6. What is your office phone number [phone]    7.   What is your office fax number [phone]  8.   Anesthesia type (None, local, MAC, general) ? CHOICE    Michael Delgado 06/29/2018, 4:32 PM  _________________________________________________________________   (provider comments below)

## 2018-06-29 NOTE — Telephone Encounter (Signed)
Dr. Radford Pax, please review. You ordered a echo and stress test last year due to new RBBB. They were not done due to cost reason per patient. Do you recommend proceeding with them prior to clearance. Surgery is nonurgent.   Instructed the patient, once he figure out the date of the surgery, he will contact Dr. Lennette Bihari Little/Dr. Jodi Mourning coumadin clinic to schedule a appointment at least 7 days ahead of the time to arrange Lovenox Bridging since he is on Warfarin for mechanical aortic valve. Although warfarin is not listed as one of his medication, he says he is taking it. I will put warfarin back on his medication list.

## 2018-06-30 NOTE — Telephone Encounter (Addendum)
   Primary Cardiologist: Fransico Him, MD  Chart reviewed as part of pre-operative protocol coverage.   Please inform patient that Dr. Radford Pax reviewed his history and feels he would need the echo and stress test she had previously recommended before clearing for surgery. However, given concerns over Covid-19 transmission, at this time our office is requesting to defer all routine follow-up testing including the aforementioned tests. I will be forwarding this message to the following pools - callback staff and [phone] cancellation box.  Pre-op call back staff:  - Please ask the patient to inquire to his surgeon if he is still planning on recommending he go forward with the surgery as the surgeon general currently is recommending elective surgeries be cancelled at this time.  - If the surgery is still planned to go forward, please let the patient know we will forward to our Covid-19 scheduling inbox to arrange his stress test and echo when felt safe and they will call him to schedule. After these are completed he would need input on his anticoagulation. Can you also find out who is monitoring his INR?  Covid 19 cancellation scheduling staff: Can you please forward this back to the P CV DIV PREOP when the patient's testing is eventually scheduled? Thank you.  Will also route this update to the surgeon's office. Please call with questions.  I will remove this from the pre-op box in the meantime until we hear back regarding timing of testing as above.   Charlie Pitter, PA-C 06/30/2018, 9:10 AM

## 2018-06-30 NOTE — Telephone Encounter (Signed)
Attempted to reach pt to see if he was still planning to have his surgery. Left detail message for pt to call back

## 2018-07-02 NOTE — Telephone Encounter (Signed)
Called patient - spoke w/ wife, Michael Delgado (okay per DPR).  They are still planning to proceed with surgery - hopefully by mid- to late-April. She was advised that he will need testing prior to clearance being given. Also advised that given the current situation regarding COVID-19, that we are not scheduling routine testing at this present time. She verablized understanding.  Wife reports that he also will need clearance from his nephrologist. They are waiting on cardiac clearance prior to that appt.  Dr [name] monitors patient's INR.

## 2018-07-08 ENCOUNTER — Telehealth: Payer: Self-pay | Admitting: *Deleted

## 2018-07-08 ENCOUNTER — Telehealth: Payer: Self-pay | Admitting: Cardiology

## 2018-07-08 NOTE — Telephone Encounter (Signed)
Pt takes warfarin for mechanical AVR. He had post op afib after CABG in 11/2016 but no further arrhythmias since then per OV with Dr Radford Pax 09/2017. Pt also with hx of lupus anticoagulant and prior VTE event. He was bridged with Lovenox in 2018 and would recommend Lovenox bridging again for this procedure if he needs to hold warfarin for 5 days prior. Warfarin is now managed by [facility] - please reach out to them to have them coordinate Lovenox bridge.

## 2018-07-08 NOTE — Telephone Encounter (Signed)
   [facility] Pre-operative Risk Assessment    Request for surgical clearance:  1. What type of surgery is being performed? RIGHT KNEE SCOPE   2. When is this surgery scheduled? TBD   3. What type of clearance is required (medical clearance vs. Pharmacy clearance to hold med vs. Both)? BOTH  4. Are there any medications that need to be held prior to surgery and how long?COUMADIN   5. Practice name and name of physician performing surgery? [facility]; DR. DALLDORF   6. What is your office phone number [phone]    7.   What is your office fax number [phone]  8.   Anesthesia type (None, local, MAC, general) ? GENERAL   Julaine Hua 07/08/2018, 12:05 PM      _________________________________________________________________   (provider comments below)

## 2018-07-08 NOTE — Telephone Encounter (Signed)
error 

## 2018-07-08 NOTE — Telephone Encounter (Signed)
This patient would require Lovenox bridging while off Coumadin. The patient's Coumadin is managed by [facility]. Please reach out to them to coordinate Lovenox bridging.  Kerin Ransom PA-C 07/08/2018 1:14 PM

## 2018-07-10 NOTE — Telephone Encounter (Signed)
Spoke with Kensey from requesting office. I have notified her that we do not manage pt's coumadin and they will need to reach out to [facility]

## 2018-07-13 DIAGNOSIS — D045 Carcinoma in situ of skin of trunk: Secondary | ICD-10-CM | POA: Diagnosis not present

## 2018-07-14 ENCOUNTER — Telehealth: Payer: Self-pay

## 2018-07-14 DIAGNOSIS — I350 Nonrheumatic aortic (valve) stenosis: Secondary | ICD-10-CM

## 2018-07-14 DIAGNOSIS — I2510 Atherosclerotic heart disease of native coronary artery without angina pectoris: Secondary | ICD-10-CM

## 2018-07-14 NOTE — Telephone Encounter (Signed)
Spoke with pt's wife (DPR on file) and made her aware of pre-operative clearance recommendations to have an Echo and myoview lexiscan done in May. Made pt's wife know that due to COVID-19 it could be reschedule. I also made pt's wife aware that someone will be calling pt in the near future to schedule testing. Pt's wife verbalized understanding and thanked me for the call. Orders have been placed in Epic.

## 2018-07-14 NOTE — Telephone Encounter (Signed)
Please let pt know the timing of the tests is dependent upon the course of COVID 19. We will arrange for May but be aware this may be rescheduled as well.   He will need Echo and lexiscan myoview in early May for clearance.

## 2018-07-16 ENCOUNTER — Telehealth: Payer: Self-pay | Admitting: Cardiology

## 2018-07-16 NOTE — Telephone Encounter (Signed)
Follow Up:    Michael Delgado is calling to check on the status of pt's clearance. Please fax this clearance asap to [phone] WER:[id number]

## 2018-07-16 NOTE — Telephone Encounter (Signed)
I will re-fax letter by Melina Copa PA-C from 06/30/18.

## 2018-09-04 ENCOUNTER — Telehealth (HOSPITAL_COMMUNITY): Payer: Self-pay | Admitting: *Deleted

## 2018-09-04 NOTE — Telephone Encounter (Signed)
Patient given detailed instructions per Myocardial Perfusion Study Information Sheet for the test on 09/09/18 at 7:15. Patient notified to arrive 15 minutes early and that it is imperative to arrive on time for appointment to keep from having the test rescheduled.  If you need to cancel or reschedule your appointment, please call the office within 24 hours of your appointment. . Patient verbalized understanding.Michael Delgado

## 2018-09-09 ENCOUNTER — Ambulatory Visit (HOSPITAL_COMMUNITY): Payer: BLUE CROSS/BLUE SHIELD | Attending: Cardiology

## 2018-09-09 ENCOUNTER — Other Ambulatory Visit: Payer: Self-pay

## 2018-09-09 ENCOUNTER — Encounter (HOSPITAL_COMMUNITY): Payer: Self-pay

## 2018-09-09 DIAGNOSIS — I350 Nonrheumatic aortic (valve) stenosis: Secondary | ICD-10-CM

## 2018-09-09 DIAGNOSIS — I2510 Atherosclerotic heart disease of native coronary artery without angina pectoris: Secondary | ICD-10-CM | POA: Diagnosis not present

## 2018-09-09 LAB — MYOCARDIAL PERFUSION IMAGING
LV dias vol: 88 mL (ref 62–150)
LV sys vol: 31 mL
Peak HR: 80 {beats}/min
Rest HR: 71 {beats}/min
SDS: 2
SRS: 2
SSS: 5
TID: 0.97

## 2018-09-09 MED ORDER — REGADENOSON 0.4 MG/5ML IV SOLN
0.4000 mg | Freq: Once | INTRAVENOUS | Status: AC
  Administered 2018-09-09: 0.4 mg via INTRAVENOUS

## 2018-09-09 MED ORDER — TECHNETIUM TC 99M TETROFOSMIN IV KIT
10.1000 | PACK | Freq: Once | INTRAVENOUS | Status: AC | PRN
  Administered 2018-09-09: 10.1 via INTRAVENOUS
  Filled 2018-09-09: qty 11

## 2018-09-09 MED ORDER — TECHNETIUM TC 99M TETROFOSMIN IV KIT
32.6000 | PACK | Freq: Once | INTRAVENOUS | Status: AC | PRN
  Administered 2018-09-09: 32.6 via INTRAVENOUS
  Filled 2018-09-09: qty 33

## 2018-09-09 NOTE — Telephone Encounter (Signed)
FYI pre-op pool - pre-covid pandemic, it appears Dr. Radford Pax had ordered echo/nuc on patient and they are just coming back. (Per her reply to [facility] 06/29/18, "Yes I would still like it done prior to surgery but would postpone for now.") The nuc came to Laura's box and she routed it to Dr. Radford Pax for review. Please follow along for her response and route clearance when finalized.

## 2018-09-14 ENCOUNTER — Telehealth (HOSPITAL_COMMUNITY): Payer: Self-pay

## 2018-09-14 NOTE — Telephone Encounter (Signed)
Spoke with patient's wife per DPR.  COVID-19 Pre-Screening Questions:  . Do you currently have a fever? no (yes = cancel and refer to pcp for e-visit) . Have you recently travelled on a cruise, internationally, or to [place], [place], [place], [place], [place], or [place], [place] [employer]) ? no (yes = cancel, stay home, monitor symptoms, and contact pcp or initiate e-visit if symptoms develop) . Have you been in contact with someone that is currently pending confirmation of Covid19 testing or has been confirmed to have the [place] virus?  no (yes = cancel, stay home, away from tested individual, monitor symptoms, and contact pcp or initiate e-visit if symptoms develop) . Are you currently experiencing fatigue or cough? no (yes = pt should be prepared to have a mask placed at the time of their visit). .  . Reiterated no additional visitors. Eartha Inch no earlier than 15 minutes before appointment time. . Please bring own mask.

## 2018-09-15 ENCOUNTER — Other Ambulatory Visit: Payer: Self-pay

## 2018-09-15 ENCOUNTER — Ambulatory Visit (HOSPITAL_COMMUNITY): Payer: BLUE CROSS/BLUE SHIELD | Attending: Cardiology

## 2018-09-15 DIAGNOSIS — I2510 Atherosclerotic heart disease of native coronary artery without angina pectoris: Secondary | ICD-10-CM

## 2018-09-15 DIAGNOSIS — I350 Nonrheumatic aortic (valve) stenosis: Secondary | ICD-10-CM | POA: Insufficient documentation

## 2018-09-15 MED ORDER — PERFLUTREN LIPID MICROSPHERE
1.0000 mL | INTRAVENOUS | Status: AC | PRN
  Administered 2018-09-15: 2 mL via INTRAVENOUS

## 2018-09-16 NOTE — Telephone Encounter (Signed)
Looks like echo is stable with good prosthetic valve function and myoview was low risk. I will send to Dr.Turner for her review. If OK with her I will call pt and make sure no new symptoms and can be cleared for surgery.

## 2018-09-16 NOTE — Telephone Encounter (Signed)
Yes I am fine with clearance but will need to be seen by coumadin clinic

## 2018-09-16 NOTE — Telephone Encounter (Signed)
   Primary Cardiologist: Fransico Him, MD  Chart reviewed as part of pre-operative protocol coverage. The patient has history of mechanical aortic valve replacement, CABG, post op afib with no recurrences.  He also has history of lupus anticoagulant on long term warfarin therapy. He had an echocardiogram on 09/15/2018 that showed normal EF and normal mechanical valve functioning. He has RBBB and had low risk nuclear stress test on 09/09/18. I called and spoke to the patient's wife with test results and she says that he is doing very well with no anginal symptoms.   Pt takes warfarin for mechanical AVR. He had post op afib after CABG in 11/2016 but no further arrhythmias since then per OV with Dr Radford Pax 09/2017. Pt also with hx of lupus anticoagulant and prior VTE event. He was bridged with Lovenox in 2018 and would recommend Lovenox bridging again for this procedure if he needs to hold warfarin for 5 days prior. Warfarin is now managed by [facility] - please reach out to them to have them coordinate Lovenox bridge.  I discussed the need for bridging with the patient's wife who will follow up on this recommendation.   I will route this recommendation to the requesting party via Epic fax function and remove from pre-op pool.  Please call with questions.  Daune Perch, NP 09/16/2018, 4:58 PM

## 2018-09-23 ENCOUNTER — Encounter (HOSPITAL_BASED_OUTPATIENT_CLINIC_OR_DEPARTMENT_OTHER): Payer: Self-pay | Admitting: *Deleted

## 2018-09-23 ENCOUNTER — Emergency Department (HOSPITAL_BASED_OUTPATIENT_CLINIC_OR_DEPARTMENT_OTHER)
Admission: EM | Admit: 2018-09-23 | Discharge: 2018-09-23 | Disposition: A | Discharge status: Home / Self Care | Payer: BLUE CROSS/BLUE SHIELD | Attending: Emergency Medicine | Admitting: Emergency Medicine

## 2018-09-23 ENCOUNTER — Other Ambulatory Visit: Payer: Self-pay

## 2018-09-23 DIAGNOSIS — W01198A Fall on same level from slipping, tripping and stumbling with subsequent striking against other object, initial encounter: Secondary | ICD-10-CM | POA: Insufficient documentation

## 2018-09-23 DIAGNOSIS — Z7901 Long term (current) use of anticoagulants: Secondary | ICD-10-CM | POA: Insufficient documentation

## 2018-09-23 DIAGNOSIS — Z8571 Personal history of Hodgkin lymphoma: Secondary | ICD-10-CM | POA: Diagnosis not present

## 2018-09-23 DIAGNOSIS — I2510 Atherosclerotic heart disease of native coronary artery without angina pectoris: Secondary | ICD-10-CM | POA: Diagnosis not present

## 2018-09-23 DIAGNOSIS — S61210A Laceration without foreign body of right index finger without damage to nail, initial encounter: Secondary | ICD-10-CM | POA: Diagnosis not present

## 2018-09-23 DIAGNOSIS — Z87891 Personal history of nicotine dependence: Secondary | ICD-10-CM | POA: Diagnosis not present

## 2018-09-23 DIAGNOSIS — S6981XA Other specified injuries of right wrist, hand and finger(s), initial encounter: Secondary | ICD-10-CM | POA: Diagnosis present

## 2018-09-23 DIAGNOSIS — Y9289 Other specified places as the place of occurrence of the external cause: Secondary | ICD-10-CM | POA: Diagnosis not present

## 2018-09-23 DIAGNOSIS — Z79899 Other long term (current) drug therapy: Secondary | ICD-10-CM | POA: Insufficient documentation

## 2018-09-23 DIAGNOSIS — Y939 Activity, unspecified: Secondary | ICD-10-CM | POA: Diagnosis not present

## 2018-09-23 DIAGNOSIS — S61411A Laceration without foreign body of right hand, initial encounter: Secondary | ICD-10-CM | POA: Insufficient documentation

## 2018-09-23 DIAGNOSIS — Y990 Civilian activity done for income or pay: Secondary | ICD-10-CM | POA: Diagnosis not present

## 2018-09-23 DIAGNOSIS — E039 Hypothyroidism, unspecified: Secondary | ICD-10-CM | POA: Insufficient documentation

## 2018-09-23 DIAGNOSIS — I10 Essential (primary) hypertension: Secondary | ICD-10-CM | POA: Diagnosis not present

## 2018-09-23 DIAGNOSIS — Z85528 Personal history of other malignant neoplasm of kidney: Secondary | ICD-10-CM | POA: Insufficient documentation

## 2018-09-23 DIAGNOSIS — J449 Chronic obstructive pulmonary disease, unspecified: Secondary | ICD-10-CM | POA: Insufficient documentation

## 2018-09-23 DIAGNOSIS — Z951 Presence of aortocoronary bypass graft: Secondary | ICD-10-CM | POA: Insufficient documentation

## 2018-09-23 MED ORDER — LIDOCAINE HCL (PF) 1 % IJ SOLN
30.0000 mL | Freq: Once | INTRAMUSCULAR | Status: AC
  Administered 2018-09-23: 30 mL
  Filled 2018-09-23: qty 30

## 2018-09-23 NOTE — ED Notes (Signed)
Pt understood dc material. NAD noted. All questions answered to satisfaction. Pt escorted to check out window 
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
## 2018-09-23 NOTE — ED Triage Notes (Signed)
Pt reports lacerations to his right hand with steel, approx 4pm, pt takes coumadin. dsd in place, no active bleeding observed. Pt denies any pain or other c/o.

## 2018-09-23 NOTE — Discharge Instructions (Addendum)

## 2018-10-09 ENCOUNTER — Other Ambulatory Visit: Payer: Self-pay | Admitting: Cardiology

## 2018-10-09 DIAGNOSIS — Z7901 Long term (current) use of anticoagulants: Secondary | ICD-10-CM | POA: Diagnosis not present

## 2018-10-09 DIAGNOSIS — Z6839 Body mass index (BMI) 39.0-39.9, adult: Secondary | ICD-10-CM | POA: Diagnosis not present

## 2018-10-09 DIAGNOSIS — I10 Essential (primary) hypertension: Secondary | ICD-10-CM

## 2018-10-09 DIAGNOSIS — I2510 Atherosclerotic heart disease of native coronary artery without angina pectoris: Secondary | ICD-10-CM

## 2018-10-09 DIAGNOSIS — E785 Hyperlipidemia, unspecified: Secondary | ICD-10-CM

## 2018-10-09 DIAGNOSIS — I350 Nonrheumatic aortic (valve) stenosis: Secondary | ICD-10-CM

## 2018-10-09 DIAGNOSIS — M25561 Pain in right knee: Secondary | ICD-10-CM | POA: Diagnosis not present

## 2018-10-09 DIAGNOSIS — I4510 Unspecified right bundle-branch block: Secondary | ICD-10-CM

## 2018-10-09 DIAGNOSIS — D649 Anemia, unspecified: Secondary | ICD-10-CM | POA: Diagnosis not present

## 2018-10-09 MED ORDER — LOSARTAN POTASSIUM 100 MG PO TABS: 100.0000 mg | ORAL_TABLET | Freq: Every day | ORAL | 0 refills | Status: DC

## 2018-10-12 ENCOUNTER — Other Ambulatory Visit: Payer: Self-pay | Admitting: Cardiology

## 2018-10-12 MED ORDER — METOPROLOL TARTRATE 50 MG PO TABS: 50.0000 mg | ORAL_TABLET | Freq: Two times a day (BID) | ORAL | 0 refills | Status: DC

## 2018-10-12 NOTE — Telephone Encounter (Signed)
Pt's medication was sent to pt's pharmacy as requested. Confirmation received.  °

## 2018-10-26 ENCOUNTER — Other Ambulatory Visit: Payer: Self-pay | Admitting: Cardiology

## 2018-10-26 MED ORDER — ATORVASTATIN CALCIUM 80 MG PO TABS: 80.0000 mg | ORAL_TABLET | Freq: Every day | ORAL | 0 refills | Status: DC

## 2018-11-05 DIAGNOSIS — M1711 Unilateral primary osteoarthritis, right knee: Secondary | ICD-10-CM | POA: Diagnosis not present

## 2018-11-05 DIAGNOSIS — M94261 Chondromalacia, right knee: Secondary | ICD-10-CM | POA: Diagnosis not present

## 2018-11-05 DIAGNOSIS — S83231A Complex tear of medial meniscus, current injury, right knee, initial encounter: Secondary | ICD-10-CM | POA: Diagnosis not present

## 2018-11-09 DIAGNOSIS — Z7901 Long term (current) use of anticoagulants: Secondary | ICD-10-CM | POA: Diagnosis not present

## 2018-11-10 ENCOUNTER — Other Ambulatory Visit: Payer: Self-pay | Admitting: Cardiology

## 2018-11-10 DIAGNOSIS — I350 Nonrheumatic aortic (valve) stenosis: Secondary | ICD-10-CM

## 2018-11-10 DIAGNOSIS — I2510 Atherosclerotic heart disease of native coronary artery without angina pectoris: Secondary | ICD-10-CM

## 2018-11-10 DIAGNOSIS — I4510 Unspecified right bundle-branch block: Secondary | ICD-10-CM

## 2018-11-10 DIAGNOSIS — E785 Hyperlipidemia, unspecified: Secondary | ICD-10-CM

## 2018-11-10 DIAGNOSIS — I10 Essential (primary) hypertension: Secondary | ICD-10-CM

## 2018-11-10 MED ORDER — LOSARTAN POTASSIUM 100 MG PO TABS: 100.0000 mg | ORAL_TABLET | Freq: Every day | ORAL | 0 refills | Status: DC

## 2018-11-10 MED ORDER — METOPROLOL TARTRATE 50 MG PO TABS: 50.0000 mg | ORAL_TABLET | Freq: Two times a day (BID) | ORAL | 0 refills | Status: DC

## 2018-11-11 DIAGNOSIS — M25661 Stiffness of right knee, not elsewhere classified: Secondary | ICD-10-CM | POA: Diagnosis not present

## 2018-11-11 DIAGNOSIS — M25561 Pain in right knee: Secondary | ICD-10-CM | POA: Diagnosis not present

## 2018-11-13 ENCOUNTER — Other Ambulatory Visit: Payer: Self-pay | Admitting: Cardiology

## 2018-11-13 DIAGNOSIS — Z7901 Long term (current) use of anticoagulants: Secondary | ICD-10-CM | POA: Diagnosis not present

## 2018-11-13 DIAGNOSIS — Z952 Presence of prosthetic heart valve: Secondary | ICD-10-CM | POA: Diagnosis not present

## 2018-11-13 MED ORDER — ATORVASTATIN CALCIUM 80 MG PO TABS: 80.0000 mg | ORAL_TABLET | Freq: Every day | ORAL | 0 refills | Status: DC

## 2018-11-16 DIAGNOSIS — M25661 Stiffness of right knee, not elsewhere classified: Secondary | ICD-10-CM | POA: Diagnosis not present

## 2018-11-16 DIAGNOSIS — M25561 Pain in right knee: Secondary | ICD-10-CM | POA: Diagnosis not present

## 2018-11-16 DIAGNOSIS — Z7901 Long term (current) use of anticoagulants: Secondary | ICD-10-CM | POA: Diagnosis not present

## 2018-11-17 DIAGNOSIS — Z7901 Long term (current) use of anticoagulants: Secondary | ICD-10-CM | POA: Diagnosis not present

## 2018-11-19 DIAGNOSIS — Z7901 Long term (current) use of anticoagulants: Secondary | ICD-10-CM | POA: Diagnosis not present

## 2018-11-22 NOTE — Patient Instructions (Addendum)
Medication Instructions:  Your physician recommends that you continue on your current medications as directed. Please refer to the Current Medication list given to you today.  If you need a refill on your cardiac medications before your next appointment, please call your pharmacy.   Lab work: None   If you have labs (blood work) drawn today and your tests are completely normal, you will receive your results only by: Marland Kitchen MyChart Message (if you have MyChart) OR . A paper copy in the mail If you have any lab test that is abnormal or we need to change your treatment, we will call you to review the results.  Testing/Procedures: None   Follow-Up: At [facility], you and your health needs are our priority.  As part of our continuing mission to provide you with exceptional heart care, we have created designated Provider Care Teams.  These Care Teams include your primary Cardiologist (physician) and Advanced Practice Providers (APPs -  Physician Assistants and Nurse Practitioners) who all work together to provide you with the care you need, when you need it. You will need a follow up appointment in 12 months.  Please call our office 2 months in advance to schedule this appointment.  You may see Fransico Him, MD or one of the following Advanced Practice Providers on your designated Care Team:   Dent, PA-C Melina Copa, PA-C . Ermalinda Barrios, PA-C  Any Other Special Instructions Will Be Listed Below (If Applicable).   Lifestyle Modifications to Prevent and Treat Heart Disease -Recommend heart healthy/Mediterranean diet, with whole grains, fruits, vegetables, fish, lean meats, nuts, olive oil and avocado oil.  -Limit salt intake to less than 1500 mg per day.  -Recommend moderate walking, starting slowly with a few minutes and working up to 3-5 times/week for 30-50 minutes each session. Aim for at least 150 minutes.week. Goal should be pace of 3 miles/hours, or walking 1.5 miles in 30  minutes -Recommend avoidance of tobacco products. Avoid excess alcohol. -Keep blood pressure well controlled, ideally less than 130/80.
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
## 2018-11-23 ENCOUNTER — Other Ambulatory Visit: Payer: Self-pay

## 2018-11-23 ENCOUNTER — Encounter: Payer: Self-pay | Admitting: Cardiology

## 2018-11-23 ENCOUNTER — Ambulatory Visit (INDEPENDENT_AMBULATORY_CARE_PROVIDER_SITE_OTHER): Payer: BC Managed Care – PPO | Admitting: Cardiology

## 2018-11-23 VITALS — BP 110/70 | HR 85 | Ht 69.0 in | Wt 256.8 lb

## 2018-11-23 DIAGNOSIS — I4510 Unspecified right bundle-branch block: Secondary | ICD-10-CM

## 2018-11-23 DIAGNOSIS — E785 Hyperlipidemia, unspecified: Secondary | ICD-10-CM | POA: Diagnosis not present

## 2018-11-23 DIAGNOSIS — I10 Essential (primary) hypertension: Secondary | ICD-10-CM | POA: Diagnosis not present

## 2018-11-23 DIAGNOSIS — I2510 Atherosclerotic heart disease of native coronary artery without angina pectoris: Secondary | ICD-10-CM | POA: Diagnosis not present

## 2018-11-23 DIAGNOSIS — Z954 Presence of other heart-valve replacement: Secondary | ICD-10-CM

## 2018-11-23 MED ORDER — METOPROLOL TARTRATE 50 MG PO TABS: 50.0000 mg | ORAL_TABLET | Freq: Two times a day (BID) | ORAL | 3 refills | Status: DC

## 2018-11-23 MED ORDER — LOSARTAN POTASSIUM 100 MG PO TABS: 100.0000 mg | ORAL_TABLET | Freq: Every day | ORAL | 3 refills | Status: DC

## 2018-11-23 MED ORDER — ATORVASTATIN CALCIUM 80 MG PO TABS: 80.0000 mg | ORAL_TABLET | Freq: Every day | ORAL | 3 refills | Status: AC

## 2018-11-25 DIAGNOSIS — M25561 Pain in right knee: Secondary | ICD-10-CM | POA: Diagnosis not present

## 2018-11-25 DIAGNOSIS — M25661 Stiffness of right knee, not elsewhere classified: Secondary | ICD-10-CM | POA: Diagnosis not present

## 2018-11-30 DIAGNOSIS — L570 Actinic keratosis: Secondary | ICD-10-CM | POA: Diagnosis not present

## 2018-11-30 DIAGNOSIS — L821 Other seborrheic keratosis: Secondary | ICD-10-CM | POA: Diagnosis not present

## 2018-12-02 DIAGNOSIS — M25661 Stiffness of right knee, not elsewhere classified: Secondary | ICD-10-CM | POA: Diagnosis not present

## 2018-12-02 DIAGNOSIS — M25561 Pain in right knee: Secondary | ICD-10-CM | POA: Diagnosis not present

## 2018-12-07 ENCOUNTER — Ambulatory Visit (INDEPENDENT_AMBULATORY_CARE_PROVIDER_SITE_OTHER): Payer: BC Managed Care – PPO | Admitting: Neurology

## 2018-12-07 ENCOUNTER — Other Ambulatory Visit: Payer: Self-pay

## 2018-12-07 ENCOUNTER — Encounter: Payer: Self-pay | Admitting: Neurology

## 2018-12-07 VITALS — BP 129/75 | HR 78 | Temp 97.5°F | Ht 69.0 in | Wt 258.5 lb

## 2018-12-07 DIAGNOSIS — R413 Other amnesia: Secondary | ICD-10-CM

## 2018-12-07 DIAGNOSIS — G479 Sleep disorder, unspecified: Secondary | ICD-10-CM | POA: Diagnosis not present

## 2018-12-07 DIAGNOSIS — M25561 Pain in right knee: Secondary | ICD-10-CM | POA: Diagnosis not present

## 2018-12-07 DIAGNOSIS — M25661 Stiffness of right knee, not elsewhere classified: Secondary | ICD-10-CM | POA: Diagnosis not present

## 2018-12-07 DIAGNOSIS — Z7901 Long term (current) use of anticoagulants: Secondary | ICD-10-CM | POA: Diagnosis not present
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
## 2018-12-08 ENCOUNTER — Telehealth: Payer: Self-pay | Admitting: Neurology

## 2018-12-08 ENCOUNTER — Telehealth: Payer: Self-pay

## 2018-12-08 LAB — VITAMIN B12: Vitamin B-12: 406 pg/mL (ref 232–1245)

## 2018-12-08 LAB — HIV ANTIBODY (ROUTINE TESTING W REFLEX): HIV Screen 4th Generation wRfx: NONREACTIVE

## 2018-12-08 LAB — SEDIMENTATION RATE: Sed Rate: 16 mm/hr (ref 0–30)

## 2018-12-08 LAB — RPR: RPR Ser Ql: NONREACTIVE

## 2018-12-08 NOTE — Telephone Encounter (Signed)
[employer] Auth: [id number] (exp. 12/08/18 to 06/05/19) order sent to GI. They will reach out to the patient to schedule.

## 2018-12-08 NOTE — Telephone Encounter (Signed)
-----   Message from Kathrynn Ducking, MD sent at 12/08/2018  7:22 AM EDT -----  The blood work results are unremarkable. Please call the patient. ----- Message ----- From: Lavone Neri Lab Results In Sent: 12/08/2018   5:38 AM EDT To: Kathrynn Ducking, MD

## 2018-12-08 NOTE — Telephone Encounter (Signed)
I called pt, advised him that his blood work was unremarkable. Pt verbalized understanding of results. Pt had no questions at this time but was encouraged to call back if questions arise.

## 2018-12-09 DIAGNOSIS — M25661 Stiffness of right knee, not elsewhere classified: Secondary | ICD-10-CM | POA: Diagnosis not present

## 2018-12-09 DIAGNOSIS — Z7901 Long term (current) use of anticoagulants: Secondary | ICD-10-CM | POA: Diagnosis not present

## 2018-12-09 DIAGNOSIS — M25561 Pain in right knee: Secondary | ICD-10-CM | POA: Diagnosis not present

## 2018-12-15 ENCOUNTER — Other Ambulatory Visit (HOSPITAL_COMMUNITY): Payer: Self-pay | Admitting: Orthopedic Surgery

## 2018-12-15 ENCOUNTER — Other Ambulatory Visit: Payer: Self-pay

## 2018-12-15 ENCOUNTER — Ambulatory Visit (HOSPITAL_COMMUNITY)
Admission: RE | Admit: 2018-12-15 | Discharge: 2018-12-15 | Disposition: A | Discharge status: Home / Self Care | Payer: BC Managed Care – PPO | Source: Ambulatory Visit | Attending: Orthopaedic Surgery | Admitting: Orthopaedic Surgery

## 2018-12-15 DIAGNOSIS — M7989 Other specified soft tissue disorders: Secondary | ICD-10-CM

## 2018-12-15 DIAGNOSIS — M79604 Pain in right leg: Secondary | ICD-10-CM | POA: Insufficient documentation

## 2018-12-15 DIAGNOSIS — M25661 Stiffness of right knee, not elsewhere classified: Secondary | ICD-10-CM | POA: Diagnosis not present

## 2018-12-15 DIAGNOSIS — M25561 Pain in right knee: Secondary | ICD-10-CM | POA: Diagnosis not present

## 2018-12-15 NOTE — Progress Notes (Signed)
Right lower extremity venous duplex completed. Preliminary results in Chart review CV Proc. [place] Emaline Karnes,RVS 12/15/2018, 2:05 PM

## 2018-12-16 ENCOUNTER — Institutional Professional Consult (permissible substitution): Payer: BC Managed Care – PPO | Admitting: Neurology

## 2018-12-16 ENCOUNTER — Ambulatory Visit
Admission: RE | Admit: 2018-12-16 | Discharge: 2018-12-16 | Disposition: A | Discharge status: Home / Self Care | Payer: BC Managed Care – PPO | Source: Ambulatory Visit | Attending: Neurology | Admitting: Neurology

## 2018-12-16 DIAGNOSIS — R413 Other amnesia: Secondary | ICD-10-CM | POA: Diagnosis not present

## 2018-12-17 DIAGNOSIS — M25661 Stiffness of right knee, not elsewhere classified: Secondary | ICD-10-CM | POA: Diagnosis not present

## 2018-12-17 DIAGNOSIS — M25561 Pain in right knee: Secondary | ICD-10-CM | POA: Diagnosis not present

## 2018-12-18 ENCOUNTER — Telehealth: Payer: Self-pay | Admitting: Neurology

## 2018-12-18 NOTE — Telephone Encounter (Signed)
I called the patient.  CT of the head was unremarkable.    CT head 12/17/18:  IMPRESSION:   Unremarkable CT head (without). No acute findings.

## 2018-12-23 ENCOUNTER — Ambulatory Visit (INDEPENDENT_AMBULATORY_CARE_PROVIDER_SITE_OTHER): Payer: BC Managed Care – PPO | Admitting: Neurology

## 2018-12-23 ENCOUNTER — Other Ambulatory Visit: Payer: Self-pay

## 2018-12-23 ENCOUNTER — Encounter: Payer: Self-pay | Admitting: Neurology

## 2018-12-23 DIAGNOSIS — M25661 Stiffness of right knee, not elsewhere classified: Secondary | ICD-10-CM | POA: Diagnosis not present

## 2018-12-23 DIAGNOSIS — Z7901 Long term (current) use of anticoagulants: Secondary | ICD-10-CM | POA: Diagnosis not present

## 2018-12-23 DIAGNOSIS — M25561 Pain in right knee: Secondary | ICD-10-CM | POA: Diagnosis not present

## 2018-12-23 DIAGNOSIS — Z952 Presence of prosthetic heart valve: Secondary | ICD-10-CM

## 2018-12-23 DIAGNOSIS — I350 Nonrheumatic aortic (valve) stenosis: Secondary | ICD-10-CM

## 2018-12-23 DIAGNOSIS — I480 Paroxysmal atrial fibrillation: Secondary | ICD-10-CM

## 2018-12-23 DIAGNOSIS — S065X0A Traumatic subdural hemorrhage without loss of consciousness, initial encounter: Secondary | ICD-10-CM

## 2018-12-23 DIAGNOSIS — Z85528 Personal history of other malignant neoplasm of kidney: Secondary | ICD-10-CM | POA: Diagnosis not present

## 2018-12-23 DIAGNOSIS — Z8571 Personal history of Hodgkin lymphoma: Secondary | ICD-10-CM

## 2018-12-23 MED ORDER — TRAZODONE HCL 50 MG PO TABS: 50.0000 mg | ORAL_TABLET | Freq: Every day | ORAL | 1 refills | Status: DC

## 2018-12-23 NOTE — Addendum Note (Signed)
Addended by: Larey Seat on: 12/23/2018 01:43 PM   Modules accepted: Orders

## 2018-12-23 NOTE — Patient Instructions (Signed)
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
## 2018-12-24 DIAGNOSIS — M25561 Pain in right knee: Secondary | ICD-10-CM | POA: Diagnosis not present

## 2018-12-24 DIAGNOSIS — M25661 Stiffness of right knee, not elsewhere classified: Secondary | ICD-10-CM | POA: Diagnosis not present

## 2018-12-28 DIAGNOSIS — M25661 Stiffness of right knee, not elsewhere classified: Secondary | ICD-10-CM | POA: Diagnosis not present

## 2018-12-28 DIAGNOSIS — M25561 Pain in right knee: Secondary | ICD-10-CM | POA: Diagnosis not present

## 2019-01-09 ENCOUNTER — Ambulatory Visit (INDEPENDENT_AMBULATORY_CARE_PROVIDER_SITE_OTHER): Payer: BC Managed Care – PPO | Admitting: Neurology

## 2019-01-09 ENCOUNTER — Other Ambulatory Visit: Payer: Self-pay

## 2019-01-09 DIAGNOSIS — R0902 Hypoxemia: Secondary | ICD-10-CM

## 2019-01-09 DIAGNOSIS — Z85528 Personal history of other malignant neoplasm of kidney: Secondary | ICD-10-CM

## 2019-01-09 DIAGNOSIS — S065X0A Traumatic subdural hemorrhage without loss of consciousness, initial encounter: Secondary | ICD-10-CM

## 2019-01-09 DIAGNOSIS — I480 Paroxysmal atrial fibrillation: Secondary | ICD-10-CM

## 2019-01-09 DIAGNOSIS — I350 Nonrheumatic aortic (valve) stenosis: Secondary | ICD-10-CM

## 2019-01-09 DIAGNOSIS — Z952 Presence of prosthetic heart valve: Secondary | ICD-10-CM

## 2019-01-09 DIAGNOSIS — Z8571 Personal history of Hodgkin lymphoma: Secondary | ICD-10-CM

## 2019-01-18 NOTE — Procedures (Signed)
PATIENT'S NAME:  Michael Delgado, Michael Delgado DOB:      1960-10-23      MRN:    [id number]     DATE OF RECORDING: 01/09/2019 REFERRING M.D.:  Floyde Parkins, MD  Study Performed:   Baseline Polysomnogram HISTORY:   Michael Delgado, a right-handed Caucasian male with a possible sleep disorder presented on 12-23-2018.  He has a past medical history of Blood dyscrasia, Cancer ([place]) (08/2016), Chronic anticoagulation, COPD (chronic obstructive pulmonary disease) ([place]), Coronary artery disease involving native coronary artery of native heart without angina pectoris (2018), Erectile dysfunction, GERD (gastroesophageal reflux disease), History of bowel infarction, Hodgkin's lymphoma, lupus anticoagulant disorder, Hyperlipidemia, Hypertension, Hypothyroidism, Pneumonia, RBBB (10/09/2017), S/P aortic valve replacement with mechanical valve (11/29/2016), S/P CABG x 1 (11/29/2016), Severe aortic stenosis, Thyroid disease, and Tobacco dependency.  Chief concern according to patient :" The patient states he gets about 5-6 hours of sleep and when he wakes up around 4 am it's hard for him to go back to sleep. Intermittently, he will have some daytime sleepiness, but denies snoring and apnea. He never had a sleep study".  The patient endorsed the Epworth Sleepiness Scale at 14 points and the FSS at 15 points.   The patient's weight 262 pounds with a height of 70 (inches), resulting in a BMI of 38.1 kg/m2. The patient's neck circumference measured 15.5 inches.  CURRENT MEDICATIONS: Lipitor, Symbicort, Tylenol PM, Norco, Synthroid, Cozaar, Mag Caps, Robaxin, Lopressor, fish oil, ProAir, Coumadin   PROCEDURE:  This is a multichannel digital polysomnogram utilizing the Somnostar 11.2 system.  Electrodes and sensors were applied and monitored per AASM Specifications.   EEG, EOG, Chin and Limb EMG, were sampled at 200 Hz.  ECG, Snore and Nasal Pressure, Thermal Airflow, Respiratory Effort, CPAP Flow and Pressure, Oximetry was sampled at 50 Hz.  Digital video and audio were recorded.      BASELINE STUDY: Lights Out was at 22:07 and Lights On at 04:55.  Total recording time (TRT) was 408.5 minutes, with a total sleep time (TST) of 387.5 minutes.   The patient's sleep latency was 6.5 minutes.  REM latency was 123.5 minutes.  The sleep efficiency was 94.9 %.     SLEEP ARCHITECTURE: WASO (Wake after sleep onset) was 15 minutes.  There were 4.5 minutes in Stage N1, 304.5 minutes Stage N2, 20 minutes Stage N3 and 58.5 minutes in Stage REM.  The percentage of Stage N1 was 1.2%, Stage N2 was 78.6%, Stage N3 was 5.2% and Stage R (REM sleep) was 15.1%.  RESPIRATORY ANALYSIS:  There were a total of 11 respiratory events:  0 apneas and 11 hypopneas. The patient also had no respiratory event related arousals (RERAs).      The total APNEA/HYPOPNEA INDEX (AHI) was 1.7 /hour.  3 events occurred in REM sleep and 16 events in NREM. The REM AHI was 3.1 /hour, versus a non-REM AHI of 1.5. The patient spent 117 minutes of total sleep time in the supine position and 271 minutes in non-supine. The supine AHI was /h 3.6 versus a non-supine AHI of 0.9/h.  OXYGEN SATURATION & C02:  The Wake baseline 02 saturation was 94%, with the lowest being 71%. Time spent below 89% saturation equaled 37 minutes.   AROUSALS:  The arousals were noted as: 73 were spontaneous, 0 were associated with PLMs, and 6 were associated with respiratory events. The patient had a total of 6 Periodic Limb Movements.  The Periodic Limb Movement (PLM) index was 0.9 and the PLM Arousal  index was 0.0/hour. Audio and video analysis did not show any abnormal or unusual movements, complex behaviors, phonations or vocalizations.   Snoring was loud. Please review epoch 757, screen shot attached. EKG in normal sinus rhythm (NSR).  IMPRESSION:  1. Only Sleep hypopneas were noted, and AHI is too low to allow a diagnosis of sleep apnea.  2. Sleep hypoxia with total desaturation time over 37 minutes.   3. Moderate degree of Snoring.  RECOMMENDATIONS:  1. The diagnosis of sleep apnea cannot be made. The patient also has some hypoxemia - unrelated to apnea. Treatment in a neurological sleep lab must include PAP qualification before oxygen can be supplemented.  2. We will have to leave this to pulmonology/ cardiology.   I certify that I have reviewed the entire raw data recording prior to the issuance of this report in accordance with the Standards of Accreditation of the American Academy of Sleep Medicine (AASM)   Larey Seat, MD   01-18-2019 Diplomat, American Board of Psychiatry and Neurology  Diplomat, American Board of [place] Director, [employer] [facility] [employer]

## 2019-01-19 ENCOUNTER — Telehealth: Payer: Self-pay | Admitting: Neurology

## 2019-01-19 NOTE — Telephone Encounter (Signed)
I called pt. I advised pt that Dr. Brett Fairy reviewed their sleep study results and found that pt has no significant apnea present that would require treatment. Dr Dohmeier didn't indicate any restlessness or heart rate irregularities. Snoring was present and there was concerns of oxygen level dropping. Reviewed that oxygen level dropped as low as 71% and there was a total of 37 minutes where oxygen was below 89% throughout the study. Advised that he would need to bring this cardiologist attention in case they would feel that should be treated as insurance will not allow neurology to order oxygen alone.Pt verbalized understanding of results. Pt had no questions at this time but was encouraged to call back if questions arise. Pt asked a copy of the result be mailed to his home and was advised to bring this to his cardiologist attention in regard to low oxygen during the sleep.

## 2019-01-19 NOTE — Telephone Encounter (Signed)
-----   Message from Larey Seat, MD sent at 01/18/2019  5:09 PM EDT ----- This patient does not qualify for PAP therapy, due to lack of OSA or other sleep disordered breathing. Hypoxemia was noted , but cannot be treated here ( neurology sleep lab)  in a patient not qualifying for PAP therapy.

## 2019-03-29 ENCOUNTER — Other Ambulatory Visit: Payer: Self-pay

## 2019-03-29 ENCOUNTER — Emergency Department (HOSPITAL_COMMUNITY): Payer: Managed Care, Other (non HMO)

## 2019-03-29 ENCOUNTER — Emergency Department (HOSPITAL_COMMUNITY)
Admission: EM | Admit: 2019-03-29 | Discharge: 2019-03-29 | Disposition: A | Discharge status: Home / Self Care | Payer: Managed Care, Other (non HMO) | Attending: Emergency Medicine | Admitting: Emergency Medicine

## 2019-03-29 ENCOUNTER — Encounter (HOSPITAL_COMMUNITY): Payer: Self-pay | Admitting: Emergency Medicine

## 2019-03-29 DIAGNOSIS — Z79899 Other long term (current) drug therapy: Secondary | ICD-10-CM | POA: Insufficient documentation

## 2019-03-29 DIAGNOSIS — E039 Hypothyroidism, unspecified: Secondary | ICD-10-CM | POA: Diagnosis not present

## 2019-03-29 DIAGNOSIS — Z8571 Personal history of Hodgkin lymphoma: Secondary | ICD-10-CM | POA: Insufficient documentation

## 2019-03-29 DIAGNOSIS — Z85528 Personal history of other malignant neoplasm of kidney: Secondary | ICD-10-CM | POA: Diagnosis not present

## 2019-03-29 DIAGNOSIS — Z7901 Long term (current) use of anticoagulants: Secondary | ICD-10-CM | POA: Insufficient documentation

## 2019-03-29 DIAGNOSIS — Z87891 Personal history of nicotine dependence: Secondary | ICD-10-CM | POA: Insufficient documentation

## 2019-03-29 DIAGNOSIS — I2510 Atherosclerotic heart disease of native coronary artery without angina pectoris: Secondary | ICD-10-CM | POA: Diagnosis not present

## 2019-03-29 DIAGNOSIS — Z951 Presence of aortocoronary bypass graft: Secondary | ICD-10-CM | POA: Diagnosis not present

## 2019-03-29 DIAGNOSIS — R059 Cough, unspecified: Secondary | ICD-10-CM

## 2019-03-29 DIAGNOSIS — I10 Essential (primary) hypertension: Secondary | ICD-10-CM | POA: Insufficient documentation

## 2019-03-29 DIAGNOSIS — R05 Cough: Secondary | ICD-10-CM

## 2019-03-29 DIAGNOSIS — Z20822 Contact with and (suspected) exposure to covid-19: Secondary | ICD-10-CM

## 2019-03-29 DIAGNOSIS — U071 COVID-19: Secondary | ICD-10-CM | POA: Diagnosis not present

## 2019-03-29 DIAGNOSIS — J449 Chronic obstructive pulmonary disease, unspecified: Secondary | ICD-10-CM | POA: Insufficient documentation

## 2019-03-29 LAB — CBC WITH DIFFERENTIAL/PLATELET
Abs Immature Granulocytes: 0.01 10*3/uL (ref 0.00–0.07)
Basophils Absolute: 0 10*3/uL (ref 0.0–0.1)
Basophils Relative: 0 %
Eosinophils Absolute: 0 10*3/uL (ref 0.0–0.5)
Eosinophils Relative: 1 %
HCT: 43 % (ref 39.0–52.0)
Hemoglobin: 13.5 g/dL (ref 13.0–17.0)
Immature Granulocytes: 0 %
Lymphocytes Relative: 21 %
Lymphs Abs: 1.1 10*3/uL (ref 0.7–4.0)
MCH: 27.4 pg (ref 26.0–34.0)
MCHC: 31.4 g/dL (ref 30.0–36.0)
MCV: 87.4 fL (ref 80.0–100.0)
Monocytes Absolute: 0.6 10*3/uL (ref 0.1–1.0)
Monocytes Relative: 12 %
Neutro Abs: 3.4 10*3/uL (ref 1.7–7.7)
Neutrophils Relative %: 66 %
Platelets: 211 10*3/uL (ref 150–400)
RBC: 4.92 MIL/uL (ref 4.22–5.81)
RDW: 13.6 % (ref 11.5–15.5)
WBC: 5.1 10*3/uL (ref 4.0–10.5)
nRBC: 0 % (ref 0.0–0.2)

## 2019-03-29 LAB — BASIC METABOLIC PANEL
Anion gap: 8 (ref 5–15)
BUN: 19 mg/dL (ref 6–20)
CO2: 28 mmol/L (ref 22–32)
Calcium: 8.8 mg/dL — ABNORMAL LOW (ref 8.9–10.3)
Chloride: 105 mmol/L (ref 98–111)
Creatinine, Ser: 1.02 mg/dL (ref 0.61–1.24)
GFR calc Af Amer: >60 mL/min (ref >60)
GFR calc non Af Amer: >60 mL/min (ref >60)
Glucose, Bld: 112 mg/dL — ABNORMAL HIGH (ref 70–99)
Potassium: 4.6 mmol/L (ref 3.5–5.1)
Sodium: 141 mmol/L (ref 135–145)
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
## 2019-03-29 NOTE — ED Notes (Signed)
Mini lab: COVID POC Negative

## 2019-03-29 NOTE — ED Notes (Signed)
Pt verbalizes cough, fever, fatigue, and poor appetite for a week; "daughter had COVID." Pt denies pain. NAD; speaking full sentences.

## 2019-03-29 NOTE — Discharge Instructions (Addendum)
Please return for any problem.  Follow-up with your regular care provider as instructed.  COVID test performed today should result in about 24 hours.

## 2019-03-30 LAB — SARS CORONAVIRUS 2 (TAT 6-24 HRS): SARS Coronavirus 2: POSITIVE — AB

## 2019-06-14 ENCOUNTER — Ambulatory Visit: Payer: Self-pay | Admitting: Neurology

## 2019-11-24 ENCOUNTER — Other Ambulatory Visit: Payer: Self-pay

## 2019-11-24 MED ORDER — METOPROLOL TARTRATE 50 MG PO TABS: 50.0000 mg | ORAL_TABLET | Freq: Two times a day (BID) | ORAL | 0 refills | Status: DC

## 2019-12-22 ENCOUNTER — Other Ambulatory Visit: Payer: Self-pay | Admitting: Cardiology

## 2019-12-22 DIAGNOSIS — I10 Essential (primary) hypertension: Secondary | ICD-10-CM

## 2019-12-22 DIAGNOSIS — E785 Hyperlipidemia, unspecified: Secondary | ICD-10-CM

## 2019-12-22 DIAGNOSIS — I2510 Atherosclerotic heart disease of native coronary artery without angina pectoris: Secondary | ICD-10-CM

## 2019-12-22 DIAGNOSIS — I4510 Unspecified right bundle-branch block: Secondary | ICD-10-CM

## 2019-12-22 MED ORDER — LOSARTAN POTASSIUM 100 MG PO TABS: 100.0000 mg | ORAL_TABLET | Freq: Every day | ORAL | 0 refills | Status: DC

## 2020-01-07 ENCOUNTER — Telehealth: Payer: Self-pay | Admitting: Cardiology

## 2020-01-07 NOTE — Telephone Encounter (Signed)
Called pt to inform him that his medication was already sent to his pharmacy as requested and the his pharmacy was getting his medication ready to be picked up. I advised pt that if he has any other problems, questions or concerns, to give our office a call back. Pt verbalized understanding.

## 2020-01-07 NOTE — Telephone Encounter (Signed)
*  STAT* If patient is at the pharmacy, call can be transferred to refill team.   1. Which medications need to be refilled? (please list name of each medication and dose if known) metoprolol tartrate (LOPRESSOR) 50 MG tablet  2. Which pharmacy/location (including street and city if local pharmacy) is medication to be sent to? [facility] - [place], [place] - [address]  3. Do they need a 30 day or 90 day supply? [place]

## 2020-01-08 ENCOUNTER — Emergency Department (HOSPITAL_BASED_OUTPATIENT_CLINIC_OR_DEPARTMENT_OTHER)
Admission: EM | Admit: 2020-01-08 | Discharge: 2020-01-08 | Disposition: A | Discharge status: Home / Self Care | Payer: Managed Care, Other (non HMO) | Attending: Emergency Medicine | Admitting: Emergency Medicine

## 2020-01-08 ENCOUNTER — Other Ambulatory Visit: Payer: Self-pay

## 2020-01-08 ENCOUNTER — Emergency Department (HOSPITAL_BASED_OUTPATIENT_CLINIC_OR_DEPARTMENT_OTHER): Payer: Managed Care, Other (non HMO)

## 2020-01-08 DIAGNOSIS — J189 Pneumonia, unspecified organism: Secondary | ICD-10-CM | POA: Diagnosis not present

## 2020-01-08 DIAGNOSIS — I10 Essential (primary) hypertension: Secondary | ICD-10-CM | POA: Insufficient documentation

## 2020-01-08 DIAGNOSIS — Z20822 Contact with and (suspected) exposure to covid-19: Secondary | ICD-10-CM | POA: Insufficient documentation

## 2020-01-08 DIAGNOSIS — Z7951 Long term (current) use of inhaled steroids: Secondary | ICD-10-CM | POA: Insufficient documentation

## 2020-01-08 DIAGNOSIS — Z79890 Hormone replacement therapy (postmenopausal): Secondary | ICD-10-CM | POA: Insufficient documentation

## 2020-01-08 DIAGNOSIS — Z951 Presence of aortocoronary bypass graft: Secondary | ICD-10-CM | POA: Diagnosis not present

## 2020-01-08 DIAGNOSIS — E039 Hypothyroidism, unspecified: Secondary | ICD-10-CM | POA: Insufficient documentation

## 2020-01-08 DIAGNOSIS — J441 Chronic obstructive pulmonary disease with (acute) exacerbation: Secondary | ICD-10-CM | POA: Diagnosis not present

## 2020-01-08 DIAGNOSIS — I2510 Atherosclerotic heart disease of native coronary artery without angina pectoris: Secondary | ICD-10-CM | POA: Insufficient documentation

## 2020-01-08 DIAGNOSIS — Z79899 Other long term (current) drug therapy: Secondary | ICD-10-CM | POA: Insufficient documentation

## 2020-01-08 DIAGNOSIS — R0602 Shortness of breath: Secondary | ICD-10-CM | POA: Diagnosis present

## 2020-01-08 DIAGNOSIS — Z87891 Personal history of nicotine dependence: Secondary | ICD-10-CM | POA: Insufficient documentation

## 2020-01-08 LAB — COMPREHENSIVE METABOLIC PANEL
ALT: 32 U/L (ref 0–44)
AST: 32 U/L (ref 15–41)
Albumin: 3.7 g/dL (ref 3.5–5.0)
Alkaline Phosphatase: 54 U/L (ref 38–126)
Anion gap: 12 (ref 5–15)
BUN: 18 mg/dL (ref 6–20)
CO2: 22 mmol/L (ref 22–32)
Calcium: 8.7 mg/dL — ABNORMAL LOW (ref 8.9–10.3)
Chloride: 101 mmol/L (ref 98–111)
Creatinine, Ser: 0.94 mg/dL (ref 0.61–1.24)
GFR calc Af Amer: >60 mL/min (ref >60)
GFR calc non Af Amer: >60 mL/min (ref >60)
Glucose, Bld: 163 mg/dL — ABNORMAL HIGH (ref 70–99)
Potassium: 4.4 mmol/L (ref 3.5–5.1)
Sodium: 135 mmol/L (ref 135–145)
Total Bilirubin: 0.7 mg/dL (ref 0.3–1.2)
Total Protein: 7.1 g/dL (ref 6.5–8.1)

## 2020-01-08 LAB — CBC WITH DIFFERENTIAL/PLATELET
Abs Immature Granulocytes: 0.09 10*3/uL — ABNORMAL HIGH (ref 0.00–0.07)
Basophils Absolute: 0.1 10*3/uL (ref 0.0–0.1)
Basophils Relative: 0 %
Eosinophils Absolute: 0.1 10*3/uL (ref 0.0–0.5)
Eosinophils Relative: 1 %
HCT: 42.1 % (ref 39.0–52.0)
Hemoglobin: 13.7 g/dL (ref 13.0–17.0)
Immature Granulocytes: 1 %
Lymphocytes Relative: 6 %
Lymphs Abs: 0.9 10*3/uL (ref 0.7–4.0)
MCH: 28.1 pg (ref 26.0–34.0)
MCHC: 32.5 g/dL (ref 30.0–36.0)
MCV: 86.3 fL (ref 80.0–100.0)
Monocytes Absolute: 1 10*3/uL (ref 0.1–1.0)
Monocytes Relative: 6 %
Neutro Abs: 13.5 10*3/uL — ABNORMAL HIGH (ref 1.7–7.7)
Neutrophils Relative %: 86 %
Platelets: 277 10*3/uL (ref 150–400)
RBC: 4.88 MIL/uL (ref 4.22–5.81)
RDW: 13.3 % (ref 11.5–15.5)
WBC: 15.7 10*3/uL — ABNORMAL HIGH (ref 4.0–10.5)
nRBC: 0 % (ref 0.0–0.2)

## 2020-01-08 LAB — LACTIC ACID, PLASMA
Lactic Acid, Venous: 2.2 mmol/L (ref 0.5–1.9)
Lactic Acid, Venous: 2.3 mmol/L (ref 0.5–1.9)

## 2020-01-08 LAB — D-DIMER, QUANTITATIVE: D-Dimer, Quant: 0.51 ug/mL-FEU — ABNORMAL HIGH (ref 0.00–0.50)

## 2020-01-08 LAB — SARS CORONAVIRUS 2 BY RT PCR (HOSPITAL ORDER, PERFORMED IN ~~LOC~~ HOSPITAL LAB): SARS Coronavirus 2: NEGATIVE

## 2020-01-08 LAB — PROTIME-INR
INR: 2 — ABNORMAL HIGH (ref 0.8–1.2)
Prothrombin Time: 22.3 seconds — ABNORMAL HIGH (ref 11.4–15.2)

## 2020-01-08 LAB — FERRITIN: Ferritin: 162 ng/mL (ref 24–336)

## 2020-01-08 LAB — PROCALCITONIN: Procalcitonin: 0.23 ng/mL

## 2020-01-08 LAB — C-REACTIVE PROTEIN: CRP: 1.4 mg/dL — ABNORMAL HIGH (ref <1.0)

## 2020-01-08 LAB — TRIGLYCERIDES: Triglycerides: 115 mg/dL (ref <150)

## 2020-01-08 LAB — LACTATE DEHYDROGENASE: LDH: 237 U/L — ABNORMAL HIGH (ref 98–192)

## 2020-01-08 LAB — FIBRINOGEN: Fibrinogen: 422 mg/dL (ref 210–475)

## 2020-01-08 MED ORDER — SODIUM CHLORIDE 0.9 % IV SOLN: Freq: Once | INTRAVENOUS | Status: AC

## 2020-01-08 MED ORDER — METHYLPREDNISOLONE SODIUM SUCC 125 MG IJ SOLR
125.0000 mg | Freq: Once | INTRAMUSCULAR | Status: AC
  Administered 2020-01-08: 125 mg via INTRAVENOUS
  Filled 2020-01-08: qty 2

## 2020-01-08 MED ORDER — DOXYCYCLINE HYCLATE 100 MG PO CAPS: 100.0000 mg | ORAL_CAPSULE | Freq: Two times a day (BID) | ORAL | 0 refills | Status: DC

## 2020-01-08 MED ORDER — ACETAMINOPHEN 500 MG PO TABS
1000.0000 mg | ORAL_TABLET | Freq: Once | ORAL | Status: AC
  Administered 2020-01-08: 1000 mg via ORAL
  Filled 2020-01-08: qty 2

## 2020-01-08 MED ORDER — ALBUTEROL SULFATE HFA 108 (90 BASE) MCG/ACT IN AERS
4.0000 | INHALATION_SPRAY | Freq: Once | RESPIRATORY_TRACT | Status: AC
  Administered 2020-01-08: 4 via RESPIRATORY_TRACT
  Filled 2020-01-08: qty 6.7

## 2020-01-08 MED ORDER — SODIUM CHLORIDE 0.9 % IV SOLN
500.0000 mg | Freq: Once | INTRAVENOUS | Status: AC
  Administered 2020-01-08: 500 mg via INTRAVENOUS
  Filled 2020-01-08: qty 500

## 2020-01-08 MED ORDER — AEROCHAMBER PLUS FLO-VU MEDIUM MISC
1.0000 | Freq: Once | Status: AC
  Administered 2020-01-08: 1
  Filled 2020-01-08: qty 1

## 2020-01-08 MED ORDER — HYDROCOD POLST-CPM POLST ER 10-8 MG/5ML PO SUER: 5.0000 mL | Freq: Two times a day (BID) | ORAL | 0 refills | Status: DC | PRN

## 2020-01-08 MED ORDER — SODIUM CHLORIDE 0.9 % IV SOLN
1.0000 g | Freq: Once | INTRAVENOUS | Status: AC
  Administered 2020-01-08: 1 g via INTRAVENOUS
  Filled 2020-01-08: qty 10

## 2020-01-08 MED ORDER — SODIUM CHLORIDE 0.9 % IV SOLN
INTRAVENOUS | Status: DC | PRN
  Administered 2020-01-08: 250 mL via INTRAVENOUS

## 2020-01-08 MED ORDER — HYDROCOD POLST-CPM POLST ER 10-8 MG/5ML PO SUER
5.0000 mL | Freq: Two times a day (BID) | ORAL | 0 refills | Status: DC | PRN
Start: 2020-01-08 — End: 2020-01-08
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
## 2020-01-08 NOTE — ED Notes (Signed)
Lactic Acid 2.2, results given to Orlando Penner' RN and ED MD

## 2020-01-08 NOTE — ED Notes (Signed)
Pt ambulated in ED around nursing station on room air. O2 ranged from 96%-93%, pt denies any increase in  shob, denies dizziness, endorses feeling weak. P table to ambulate with standby assist. Pt HR 86-99.

## 2020-01-08 NOTE — ED Triage Notes (Signed)
Pt arrives pov with c/o shob and emesis with blood. PT reports non vaccinated for COVID, dx with covid in December, 2020

## 2020-01-08 NOTE — ED Notes (Signed)
ED Provider at bedside. 

## 2020-01-13 LAB — CULTURE, BLOOD (ROUTINE X 2)
Culture: NO GROWTH
Culture: NO GROWTH
Special Requests: ADEQUATE
Special Requests: ADEQUATE

## 2020-01-26 ENCOUNTER — Ambulatory Visit: Payer: Managed Care, Other (non HMO) | Admitting: Cardiology
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
## 2020-02-17 ENCOUNTER — Encounter: Payer: Self-pay | Admitting: Cardiology

## 2020-02-17 ENCOUNTER — Other Ambulatory Visit: Payer: Self-pay

## 2020-02-17 ENCOUNTER — Ambulatory Visit (INDEPENDENT_AMBULATORY_CARE_PROVIDER_SITE_OTHER): Payer: Managed Care, Other (non HMO) | Admitting: Cardiology

## 2020-02-17 VITALS — BP 106/72 | HR 69 | Ht 69.5 in | Wt 263.8 lb

## 2020-02-17 DIAGNOSIS — I350 Nonrheumatic aortic (valve) stenosis: Secondary | ICD-10-CM | POA: Diagnosis not present

## 2020-02-17 DIAGNOSIS — I10 Essential (primary) hypertension: Secondary | ICD-10-CM

## 2020-02-17 DIAGNOSIS — I4510 Unspecified right bundle-branch block: Secondary | ICD-10-CM

## 2020-02-17 DIAGNOSIS — I2510 Atherosclerotic heart disease of native coronary artery without angina pectoris: Secondary | ICD-10-CM

## 2020-02-17 DIAGNOSIS — E785 Hyperlipidemia, unspecified: Secondary | ICD-10-CM

## 2020-02-17 NOTE — Patient Instructions (Signed)
Medication Instructions:  Your physician recommends that you continue on your current medications as directed. Please refer to the Current Medication list given to you today.  *If you need a refill on your cardiac medications before your next appointment, please call your pharmacy*   Lab Work: None If you have labs (blood work) drawn today and your tests are completely normal, you will receive your results only by: Marland Kitchen MyChart Message (if you have MyChart) OR . A paper copy in the mail If you have any lab test that is abnormal or we need to change your treatment, we will call you to review the results.   Testing/Procedures: None   Follow-Up: At [facility], you and your health needs are our priority.  As part of our continuing mission to provide you with exceptional heart care, we have created designated Provider Care Teams.  These Care Teams include your primary Cardiologist (physician) and Advanced Practice Providers (APPs -  Physician Assistants and Nurse Practitioners) who all work together to provide you with the care you need, when you need it.  We recommend signing up for the patient portal called "MyChart".  Sign up information is provided on this After Visit Summary.  MyChart is used to connect with patients for Virtual Visits (Telemedicine).  Patients are able to view lab/test results, encounter notes, upcoming appointments, etc.  Non-urgent messages can be sent to your provider as well.   To learn more about what you can do with MyChart, go to [email or web address].    Your next appointment:   12 month(s)  The format for your next appointment:   In Person  Provider:   You may see Fransico Him, MD or one of the following Advanced Practice Providers on your designated Care Team:    Melina Copa, PA-C  Ermalinda Barrios, PA-C    Other Instructions

## 2020-03-03 ENCOUNTER — Other Ambulatory Visit: Payer: Self-pay | Admitting: Cardiology

## 2020-03-03 DIAGNOSIS — E785 Hyperlipidemia, unspecified: Secondary | ICD-10-CM

## 2020-03-03 DIAGNOSIS — I4510 Unspecified right bundle-branch block: Secondary | ICD-10-CM

## 2020-03-03 DIAGNOSIS — I2510 Atherosclerotic heart disease of native coronary artery without angina pectoris: Secondary | ICD-10-CM

## 2020-03-03 DIAGNOSIS — I10 Essential (primary) hypertension: Secondary | ICD-10-CM

## 2020-06-15 ENCOUNTER — Other Ambulatory Visit: Payer: Self-pay | Admitting: Cardiology

## 2020-06-15 DIAGNOSIS — I4510 Unspecified right bundle-branch block: Secondary | ICD-10-CM

## 2020-06-15 DIAGNOSIS — I2510 Atherosclerotic heart disease of native coronary artery without angina pectoris: Secondary | ICD-10-CM

## 2020-06-15 DIAGNOSIS — E785 Hyperlipidemia, unspecified: Secondary | ICD-10-CM

## 2020-06-15 DIAGNOSIS — I10 Essential (primary) hypertension: Secondary | ICD-10-CM

## 2020-12-15 ENCOUNTER — Other Ambulatory Visit: Payer: Self-pay | Admitting: Cardiology

## 2020-12-15 DIAGNOSIS — I2510 Atherosclerotic heart disease of native coronary artery without angina pectoris: Secondary | ICD-10-CM

## 2020-12-15 DIAGNOSIS — I10 Essential (primary) hypertension: Secondary | ICD-10-CM

## 2020-12-15 DIAGNOSIS — I4510 Unspecified right bundle-branch block: Secondary | ICD-10-CM

## 2020-12-15 DIAGNOSIS — E785 Hyperlipidemia, unspecified: Secondary | ICD-10-CM

## 2021-01-19 ENCOUNTER — Other Ambulatory Visit: Payer: Self-pay | Admitting: Cardiology

## 2021-06-30 ENCOUNTER — Emergency Department (HOSPITAL_BASED_OUTPATIENT_CLINIC_OR_DEPARTMENT_OTHER): Payer: Managed Care, Other (non HMO)

## 2021-06-30 ENCOUNTER — Encounter (HOSPITAL_BASED_OUTPATIENT_CLINIC_OR_DEPARTMENT_OTHER): Payer: Self-pay | Admitting: Emergency Medicine

## 2021-06-30 ENCOUNTER — Inpatient Hospital Stay (HOSPITAL_BASED_OUTPATIENT_CLINIC_OR_DEPARTMENT_OTHER)
Admission: EM | Admit: 2021-06-30 | Discharge: 2021-07-01 | DRG: 194 | Disposition: A | Discharge status: Home / Self Care | Payer: Managed Care, Other (non HMO) | Attending: Internal Medicine | Admitting: Internal Medicine

## 2021-06-30 ENCOUNTER — Other Ambulatory Visit: Payer: Self-pay

## 2021-06-30 DIAGNOSIS — I10 Essential (primary) hypertension: Secondary | ICD-10-CM | POA: Diagnosis present

## 2021-06-30 DIAGNOSIS — Z20822 Contact with and (suspected) exposure to covid-19: Secondary | ICD-10-CM | POA: Diagnosis present

## 2021-06-30 DIAGNOSIS — Z8261 Family history of arthritis: Secondary | ICD-10-CM

## 2021-06-30 DIAGNOSIS — Z7901 Long term (current) use of anticoagulants: Secondary | ICD-10-CM

## 2021-06-30 DIAGNOSIS — I2510 Atherosclerotic heart disease of native coronary artery without angina pectoris: Secondary | ICD-10-CM | POA: Diagnosis present

## 2021-06-30 DIAGNOSIS — R6510 Systemic inflammatory response syndrome (SIRS) of non-infectious origin without acute organ dysfunction: Secondary | ICD-10-CM | POA: Diagnosis not present

## 2021-06-30 DIAGNOSIS — E039 Hypothyroidism, unspecified: Secondary | ICD-10-CM | POA: Diagnosis present

## 2021-06-30 DIAGNOSIS — Z79899 Other long term (current) drug therapy: Secondary | ICD-10-CM | POA: Diagnosis not present

## 2021-06-30 DIAGNOSIS — R0902 Hypoxemia: Secondary | ICD-10-CM | POA: Diagnosis present

## 2021-06-30 DIAGNOSIS — Z951 Presence of aortocoronary bypass graft: Secondary | ICD-10-CM | POA: Diagnosis not present

## 2021-06-30 DIAGNOSIS — Z8571 Personal history of Hodgkin lymphoma: Secondary | ICD-10-CM

## 2021-06-30 DIAGNOSIS — I480 Paroxysmal atrial fibrillation: Secondary | ICD-10-CM | POA: Diagnosis present

## 2021-06-30 DIAGNOSIS — R042 Hemoptysis: Secondary | ICD-10-CM | POA: Diagnosis present

## 2021-06-30 DIAGNOSIS — I350 Nonrheumatic aortic (valve) stenosis: Secondary | ICD-10-CM

## 2021-06-30 DIAGNOSIS — Z952 Presence of prosthetic heart valve: Secondary | ICD-10-CM | POA: Diagnosis not present

## 2021-06-30 DIAGNOSIS — Z79890 Hormone replacement therapy (postmenopausal): Secondary | ICD-10-CM | POA: Diagnosis not present

## 2021-06-30 DIAGNOSIS — Z833 Family history of diabetes mellitus: Secondary | ICD-10-CM

## 2021-06-30 DIAGNOSIS — Z85528 Personal history of other malignant neoplasm of kidney: Secondary | ICD-10-CM

## 2021-06-30 DIAGNOSIS — E785 Hyperlipidemia, unspecified: Secondary | ICD-10-CM | POA: Diagnosis present

## 2021-06-30 DIAGNOSIS — J189 Pneumonia, unspecified organism: Secondary | ICD-10-CM | POA: Diagnosis not present

## 2021-06-30 DIAGNOSIS — E119 Type 2 diabetes mellitus without complications: Secondary | ICD-10-CM

## 2021-06-30 DIAGNOSIS — Z825 Family history of asthma and other chronic lower respiratory diseases: Secondary | ICD-10-CM | POA: Diagnosis not present

## 2021-06-30 DIAGNOSIS — Z7982 Long term (current) use of aspirin: Secondary | ICD-10-CM

## 2021-06-30 DIAGNOSIS — A419 Sepsis, unspecified organism: Secondary | ICD-10-CM | POA: Diagnosis not present

## 2021-06-30 DIAGNOSIS — J440 Chronic obstructive pulmonary disease with acute lower respiratory infection: Secondary | ICD-10-CM | POA: Diagnosis present

## 2021-06-30 DIAGNOSIS — Z6839 Body mass index (BMI) 39.0-39.9, adult: Secondary | ICD-10-CM | POA: Diagnosis not present

## 2021-06-30 DIAGNOSIS — Z8249 Family history of ischemic heart disease and other diseases of the circulatory system: Secondary | ICD-10-CM

## 2021-06-30 DIAGNOSIS — Z87891 Personal history of nicotine dependence: Secondary | ICD-10-CM

## 2021-06-30 DIAGNOSIS — Z83438 Family history of other disorder of lipoprotein metabolism and other lipidemia: Secondary | ICD-10-CM

## 2021-06-30 LAB — GLUCOSE, CAPILLARY
Glucose-Capillary: 129 mg/dL — ABNORMAL HIGH (ref 70–99)
Glucose-Capillary: 110 mg/dL — ABNORMAL HIGH (ref 70–99)

## 2021-06-30 LAB — COMPREHENSIVE METABOLIC PANEL
ALT: 27 U/L (ref 0–44)
AST: 27 U/L (ref 15–41)
Albumin: 3.9 g/dL (ref 3.5–5.0)
Alkaline Phosphatase: 75 U/L (ref 38–126)
Anion gap: 8 (ref 5–15)
BUN: 26 mg/dL — ABNORMAL HIGH (ref 6–20)
CO2: 24 mmol/L (ref 22–32)
Calcium: 8.6 mg/dL — ABNORMAL LOW (ref 8.9–10.3)
Chloride: 101 mmol/L (ref 98–111)
Creatinine, Ser: 1.07 mg/dL (ref 0.61–1.24)
GFR, Estimated: >60 mL/min (ref >60)
Glucose, Bld: 188 mg/dL — ABNORMAL HIGH (ref 70–99)
Potassium: 4.8 mmol/L (ref 3.5–5.1)
Sodium: 133 mmol/L — ABNORMAL LOW (ref 135–145)
Total Bilirubin: 1 mg/dL (ref 0.3–1.2)
Total Protein: 7.7 g/dL (ref 6.5–8.1)

## 2021-06-30 LAB — URINALYSIS, ROUTINE W REFLEX MICROSCOPIC
Bilirubin Urine: NEGATIVE
Glucose, UA: NEGATIVE mg/dL
Hgb urine dipstick: NEGATIVE
Ketones, ur: NEGATIVE mg/dL
Leukocytes,Ua: NEGATIVE
Nitrite: NEGATIVE
Protein, ur: NEGATIVE mg/dL
Specific Gravity, Urine: 1.011 (ref 1.005–1.030)
pH: 7 (ref 5.0–8.0)

## 2021-06-30 LAB — MRSA NEXT GEN BY PCR, NASAL: MRSA by PCR Next Gen: NOT DETECTED

## 2021-06-30 LAB — CBC WITH DIFFERENTIAL/PLATELET
Abs Immature Granulocytes: 0.05 10*3/uL (ref 0.00–0.07)
Basophils Absolute: 0 10*3/uL (ref 0.0–0.1)
Basophils Relative: 0 %
Eosinophils Absolute: 0.1 10*3/uL (ref 0.0–0.5)
Eosinophils Relative: 1 %
HCT: 40.1 % (ref 39.0–52.0)
Hemoglobin: 13.1 g/dL (ref 13.0–17.0)
Immature Granulocytes: 0 %
Lymphocytes Relative: 7 %
Lymphs Abs: 1 10*3/uL (ref 0.7–4.0)
MCH: 27.5 pg (ref 26.0–34.0)
MCHC: 32.7 g/dL (ref 30.0–36.0)
MCV: 84.2 fL (ref 80.0–100.0)
Monocytes Absolute: 1 10*3/uL (ref 0.1–1.0)
Monocytes Relative: 7 %
Neutro Abs: 13 10*3/uL — ABNORMAL HIGH (ref 1.7–7.7)
Neutrophils Relative %: 85 %
Platelets: 247 10*3/uL (ref 150–400)
RBC: 4.76 MIL/uL (ref 4.22–5.81)
RDW: 14.6 % (ref 11.5–15.5)
WBC: 15.2 10*3/uL — ABNORMAL HIGH (ref 4.0–10.5)
nRBC: 0 % (ref 0.0–0.2)

## 2021-06-30 LAB — STREP PNEUMONIAE URINARY ANTIGEN: Strep Pneumo Urinary Antigen: NEGATIVE

## 2021-06-30 LAB — APTT: aPTT: 30 seconds (ref 24–36)

## 2021-06-30 LAB — RESP PANEL BY RT-PCR (FLU A&B, COVID) ARPGX2
Influenza A by PCR: NEGATIVE
Influenza B by PCR: NEGATIVE
SARS Coronavirus 2 by RT PCR: NEGATIVE

## 2021-06-30 LAB — PROTIME-INR
INR: 2.1 — ABNORMAL HIGH (ref 0.8–1.2)
Prothrombin Time: 23.4 seconds — ABNORMAL HIGH (ref 11.4–15.2)

## 2021-06-30 LAB — LACTIC ACID, PLASMA: Lactic Acid, Venous: 1.7 mmol/L (ref 0.5–1.9)

## 2021-06-30 MED ORDER — ALBUTEROL SULFATE (2.5 MG/3ML) 0.083% IN NEBU: 2.5000 mg | INHALATION_SOLUTION | RESPIRATORY_TRACT | Status: DC | PRN

## 2021-06-30 MED ORDER — FLUTICASONE FUROATE-VILANTEROL 100-25 MCG/ACT IN AEPB
1.0000 | INHALATION_SPRAY | Freq: Every day | RESPIRATORY_TRACT | Status: DC
  Administered 2021-06-30: 1 via RESPIRATORY_TRACT
  Filled 2021-06-30: qty 28

## 2021-06-30 MED ORDER — ACETAMINOPHEN 650 MG RE SUPP: 650.0000 mg | Freq: Four times a day (QID) | RECTAL | Status: DC | PRN

## 2021-06-30 MED ORDER — ONDANSETRON HCL 4 MG PO TABS: 4.0000 mg | ORAL_TABLET | Freq: Four times a day (QID) | ORAL | Status: DC | PRN

## 2021-06-30 MED ORDER — WARFARIN - PHARMACIST DOSING INPATIENT: Freq: Every day | Status: DC

## 2021-06-30 MED ORDER — LEVOTHYROXINE SODIUM 112 MCG PO TABS: 112.0000 ug | ORAL_TABLET | Freq: Every morning | ORAL | Status: DC

## 2021-06-30 MED ORDER — INSULIN ASPART 100 UNIT/ML IJ SOLN
0.0000 [IU] | Freq: Three times a day (TID) | INTRAMUSCULAR | Status: DC
  Administered 2021-07-01: 2 [IU] via SUBCUTANEOUS

## 2021-06-30 MED ORDER — WARFARIN SODIUM 5 MG PO TABS
7.5000 mg | ORAL_TABLET | Freq: Once | ORAL | Status: AC
  Administered 2021-06-30: 7.5 mg via ORAL
  Filled 2021-06-30: qty 1

## 2021-06-30 MED ORDER — LEVOTHYROXINE SODIUM 125 MCG PO TABS
125.0000 ug | ORAL_TABLET | Freq: Every morning | ORAL | Status: DC
  Administered 2021-07-01: 125 ug via ORAL
  Filled 2021-06-30: qty 1

## 2021-06-30 MED ORDER — SODIUM CHLORIDE 0.9 % IV SOLN
500.0000 mg | INTRAVENOUS | Status: DC
  Administered 2021-06-30 – 2021-07-01 (×2): 500 mg via INTRAVENOUS
  Filled 2021-06-30 (×2): qty 5

## 2021-06-30 MED ORDER — ASPIRIN EC 81 MG PO TBEC
81.0000 mg | DELAYED_RELEASE_TABLET | Freq: Every day | ORAL | Status: DC
  Administered 2021-06-30 – 2021-07-01 (×2): 81 mg via ORAL
  Filled 2021-06-30 (×3): qty 1

## 2021-06-30 MED ORDER — EZETIMIBE 10 MG PO TABS
10.0000 mg | ORAL_TABLET | Freq: Every day | ORAL | Status: DC
  Administered 2021-06-30 – 2021-07-01 (×2): 10 mg via ORAL
  Filled 2021-06-30 (×2): qty 1

## 2021-06-30 MED ORDER — LACTATED RINGERS IV BOLUS
1000.0000 mL | Freq: Once | INTRAVENOUS | Status: AC
Start: 2021-06-30 — End: 2021-06-30
  Administered 2021-06-30: 1000 mL via INTRAVENOUS

## 2021-06-30 MED ORDER — DM-GUAIFENESIN ER 30-600 MG PO TB12
1.0000 | ORAL_TABLET | Freq: Two times a day (BID) | ORAL | Status: DC
  Administered 2021-06-30 – 2021-07-01 (×3): 1 via ORAL
  Filled 2021-06-30 (×3): qty 1

## 2021-06-30 MED ORDER — INSULIN ASPART 100 UNIT/ML IJ SOLN: 0.0000 [IU] | Freq: Every day | INTRAMUSCULAR | Status: DC

## 2021-06-30 MED ORDER — METOPROLOL TARTRATE 50 MG PO TABS
50.0000 mg | ORAL_TABLET | Freq: Two times a day (BID) | ORAL | Status: DC
  Administered 2021-07-01: 50 mg via ORAL
  Filled 2021-06-30: qty 1

## 2021-06-30 MED ORDER — POLYETHYLENE GLYCOL 3350 17 G PO PACK: 17.0000 g | PACK | Freq: Every day | ORAL | Status: DC | PRN

## 2021-06-30 MED ORDER — FLUTICASONE FUROATE-VILANTEROL 100-25 MCG/ACT IN AEPB
1.0000 | INHALATION_SPRAY | Freq: Every day | RESPIRATORY_TRACT | Status: DC
  Filled 2021-06-30: qty 28

## 2021-06-30 MED ORDER — SODIUM CHLORIDE 0.9 % IV SOLN
2.0000 g | INTRAVENOUS | Status: DC
  Administered 2021-06-30 – 2021-07-01 (×2): 2 g via INTRAVENOUS
  Filled 2021-06-30 (×2): qty 20

## 2021-06-30 MED ORDER — INSULIN GLARGINE-YFGN 100 UNIT/ML ~~LOC~~ SOLN
5.0000 [IU] | Freq: Two times a day (BID) | SUBCUTANEOUS | Status: DC
  Administered 2021-06-30: 5 [IU] via SUBCUTANEOUS
  Filled 2021-06-30 (×2): qty 0.05

## 2021-06-30 MED ORDER — LACTATED RINGERS IV SOLN: INTRAVENOUS | Status: AC

## 2021-06-30 MED ORDER — ACETAMINOPHEN 325 MG PO TABS
650.0000 mg | ORAL_TABLET | Freq: Four times a day (QID) | ORAL | Status: DC | PRN
  Administered 2021-07-01: 650 mg via ORAL
  Filled 2021-06-30: qty 2

## 2021-06-30 MED ORDER — ATORVASTATIN CALCIUM 40 MG PO TABS
80.0000 mg | ORAL_TABLET | Freq: Every day | ORAL | Status: DC
  Administered 2021-06-30 – 2021-07-01 (×2): 80 mg via ORAL
  Filled 2021-06-30 (×2): qty 2

## 2021-06-30 MED ORDER — ACETAMINOPHEN 500 MG PO TABS
1000.0000 mg | ORAL_TABLET | Freq: Once | ORAL | Status: AC
  Administered 2021-06-30: 1000 mg via ORAL
  Filled 2021-06-30: qty 2

## 2021-06-30 MED ORDER — ONDANSETRON HCL 4 MG/2ML IJ SOLN: 4.0000 mg | Freq: Four times a day (QID) | INTRAMUSCULAR | Status: DC | PRN

## 2021-06-30 NOTE — ED Triage Notes (Signed)
Pt awoke in middle of night "feeling sick". Pt has fever and cough with bloody sputum. Pt has hx of pneumonia and COPD ?

## 2021-06-30 NOTE — Assessment & Plan Note (Signed)
Per patient he was recently diagnosed with type 2 diabetes mellitus and was started on metformin by PCP. ?-Check A1c ?-Semglee 5 units twice daily ?-Moderate SSI ?

## 2021-06-30 NOTE — ED Provider Triage Note (Signed)
Emergency Medicine Provider Triage Evaluation Note ? ?Michael Delgado , a 61 y.o. male  was evaluated in triage.  Pt complains of cough, fever, hemoptysis. ? ?Review of Systems  ?Positive: Fever and cough ?Negative: vomiting ? ?Physical Exam  ?BP (!) 161/70   Pulse 88   Temp (!) 102.1 ?F (38.9 ?C) (Oral)   Resp 20   Ht 1.753 m ('5\' 9"'$ )   Wt 120.2 kg   SpO2 91%   BMI 39.13 kg/m?  ?Gen:   Awake, actively coughing up blood ?Resp:  tachypneic ?MSK:   Moves extremities without difficulty  ?Other:   ? ?Medical Decision Making  ?Medically screening exam initiated at 6:46 AM.  Appropriate orders placed.  Cutter Passey was informed that the remainder of the evaluation will be completed by another provider, this initial triage assessment does not replace that evaluation, and the importance of remaining in the ED until their evaluation is complete. ? ?Code sepsis has been called ?  ?Ripley Fraise, MD ?06/30/21 [phone] ? ?

## 2021-06-30 NOTE — Sepsis Progress Note (Signed)
Following for sepsis monitoring ?

## 2021-06-30 NOTE — Assessment & Plan Note (Signed)
Chest x-ray concerning for right upper and lower lobe infiltrate. ?-See above further management. ?

## 2021-06-30 NOTE — ED Provider Notes (Signed)
?[facility] EMERGENCY DEPARTMENT ?Provider Note ? ? ?CSN: [id number] ?Arrival date & time: 06/30/21  6644 ? ?  ? ?History ? ?Chief Complaint  ?Patient presents with  ? Fever  ? ? ?Michael Delgado is a 61 y.o. male.  He has a history of cancer cardiac disease mechanical valve on warfarin.  Just started on Trulicity.  Felt sick since around 3 AM this morning with cough, hemoptysis, general fatigue.  No headache chest pain abdominal pain diarrhea or urinary symptoms.  Some coworkers were sick. ? ?The history is provided by the patient.  ?Fever ?Max temp prior to arrival:  102 ?Temp source:  Oral ?Onset quality:  Unable to specify ?Duration:  4 hours ?Progression:  Unchanged ?Chronicity:  New ?Relieved by:  None tried ?Worsened by:  Nothing ?Ineffective treatments:  None tried ?Associated symptoms: cough   ?Associated symptoms: no chest pain, no confusion, no diarrhea, no dysuria, no headaches, no myalgias, no nausea, no rash, no sore throat and no vomiting   ?Risk factors: sick contacts   ? ?  ? ?Home Medications ?Prior to Admission medications   ?Medication Sig Start Date End Date Taking? Authorizing Provider  ?aspirin EC 81 MG tablet Take 81 mg by mouth daily. Swallow whole.    [provider]  ?atorvastatin (LIPITOR) 80 MG tablet Take 1 tablet (80 mg total) by mouth daily. 11/23/18   Daune Perch, NP  ?budesonide-formoterol (SYMBICORT) 80-4.5 MCG/ACT inhaler Inhale 1 puff into the lungs at bedtime as needed (shortness of breath).    [provider]  ?chlorpheniramine-HYDROcodone (TUSSIONEX PENNKINETIC ER) 10-8 MG/5ML SUER Take 5 mLs by mouth every 12 (twelve) hours as needed for cough. 01/08/20   Isla Pence, MD  ?ezetimibe (ZETIA) 10 MG tablet Take 1 tablet (10 mg total) by mouth daily. Please make yearly appt with Dr. Radford Pax for November 2022 for future refills. Thank you 1st attempt 01/19/21   Sueanne Margarita, MD  ?levothyroxine (SYNTHROID) 112 MCG tablet Take 112 mcg by mouth every  morning. 01/26/20   [provider]  ?losartan (COZAAR) 100 MG tablet Take 1 tablet (100 mg total) by mouth daily. Pt needs to make appt with provider for further refills - 1st attempt 12/15/20   Sueanne Margarita, MD  ?Magnesium Oxide (MAG-CAPS PO) Take 1 capsule by mouth daily.    [provider]  ?methocarbamol (ROBAXIN) 500 MG tablet Take 1 tablet (500 mg total) by mouth every 8 (eight) hours as needed for muscle spasms. 12/12/17   Geradine Girt, DO  ?metoprolol tartrate (LOPRESSOR) 50 MG tablet Take 1 tablet (50 mg total) by mouth 2 (two) times daily. Please make yearly appt with Dr. Radford Pax for November 2022 for future refills. Thank you 1st attempt 01/19/21   Sueanne Margarita, MD  ?Omega-3 Fatty Acids (FISH OIL PO) Take 3 capsules by mouth at bedtime. 1200/360 MG    [provider]  ?PROAIR HFA 108 (90 Base) MCG/ACT inhaler Inhale 2 puffs into the lungs daily as needed for wheezing or shortness of breath.  12/19/16   [provider]  ?traZODone (DESYREL) 50 MG tablet Take 1 tablet (50 mg total) by mouth at bedtime. 12/23/18   Dohmeier, Asencion Partridge, MD  ?warfarin (COUMADIN) 5 MG tablet Take 1 tablet (5 mg total) by mouth daily. Managed by [facility] 06/29/18   [name], [place]  ?   ? ?Allergies    ?Patient has no known allergies.   ? ?Review of Systems   ?  Review of Systems  ?Constitutional:  Positive for fatigue and fever.  ?HENT:  Negative for sore throat.   ?Eyes:  Negative for visual disturbance.  ?Respiratory:  Positive for cough.   ?Cardiovascular:  Negative for chest pain.  ?Gastrointestinal:  Negative for diarrhea, nausea and vomiting.  ?Genitourinary:  Negative for dysuria.  ?Musculoskeletal:  Negative for myalgias.  ?Skin:  Negative for rash.  ?Neurological:  Negative for headaches.  ?Psychiatric/Behavioral:  Negative for confusion.   ? ?Physical Exam ?Updated Vital Signs ?BP (!) 145/70   Pulse 87   Temp (!) 102.1 ?F (38.9 ?C) (Oral)   Resp (!) 24   Ht '5\' 9"'$  (1.753 m)    Wt 120.2 kg   SpO2 96%   BMI 39.13 kg/m?  ?Physical Exam ?Vitals and nursing note reviewed.  ?Constitutional:   ?   General: He is not in acute distress. ?   Appearance: Normal appearance. He is well-developed.  ?HENT:  ?   Head: Normocephalic and atraumatic.  ?Eyes:  ?   Conjunctiva/sclera: Conjunctivae normal.  ?Cardiovascular:  ?   Rate and Rhythm: Normal rate and regular rhythm.  ?Pulmonary:  ?   Effort: Pulmonary effort is normal. No respiratory distress.  ?   Breath sounds: Rhonchi present. No wheezing.  ?Abdominal:  ?   Palpations: Abdomen is soft.  ?   Tenderness: There is no abdominal tenderness. There is no guarding or rebound.  ?Musculoskeletal:     ?   General: No swelling.  ?   Cervical back: Neck supple.  ?   Right lower leg: No edema.  ?   Left lower leg: No edema.  ?Skin: ?   General: Skin is warm and dry.  ?   Capillary Refill: Capillary refill takes less than 2 seconds.  ?Neurological:  ?   General: No focal deficit present.  ?   Mental Status: He is alert.  ? ? ?ED Results / Procedures / Treatments   ?Labs ?(all labs ordered are listed, but only abnormal results are displayed) ?Labs Reviewed  ?COMPREHENSIVE METABOLIC PANEL - Abnormal; Notable for the following components:  ?    Result Value  ? Sodium 133 (*)   ? Glucose, Bld 188 (*)   ? BUN 26 (*)   ? Calcium 8.6 (*)   ? All other components within normal limits  ?CBC WITH DIFFERENTIAL/PLATELET - Abnormal; Notable for the following components:  ? WBC 15.2 (*)   ? Neutro Abs 13.0 (*)   ? All other components within normal limits  ?PROTIME-INR - Abnormal; Notable for the following components:  ? Prothrombin Time 23.4 (*)   ? INR 2.1 (*)   ? All other components within normal limits  ?GLUCOSE, CAPILLARY - Abnormal; Notable for the following components:  ? Glucose-Capillary 129 (*)   ? All other components within normal limits  ?URINALYSIS, ROUTINE W REFLEX MICROSCOPIC - Abnormal; Notable for the following components:  ? Color, Urine STRAW (*)   ?  All other components within normal limits  ?GLUCOSE, CAPILLARY - Abnormal; Notable for the following components:  ? Glucose-Capillary 110 (*)   ? All other components within normal limits  ?RESP PANEL BY RT-PCR (FLU A&B, COVID) [id number]  ?MRSA NEXT GEN BY PCR, NASAL  ?CULTURE, BLOOD (ROUTINE X 2)  ?CULTURE, BLOOD (ROUTINE X 2)  ?EXPECTORATED SPUTUM ASSESSMENT W GRAM STAIN, RFLX TO RESP C  ?LACTIC ACID, PLASMA  ?APTT  ?HIV ANTIBODY (ROUTINE TESTING W REFLEX)  ?HEMOGLOBIN A1C  ?LEGIONELLA PNEUMOPHILA SEROGP  1 UR AG  ?STREP PNEUMONIAE URINARY ANTIGEN  ? ? ?EKG ?EKG Interpretation ? ?Date/Time:  Saturday June 30 2021 06:53:54 EDT ?Ventricular Rate:  87 ?PR Interval:  284 ?QRS Duration: 150 ?QT Interval:  385 ?QTC Calculation: 464 ?R Axis:   95 ?Text Interpretation: Sinus rhythm Prolonged PR interval RBBB and LPFB Inferior infarct, old No significant change since prior 9/21 Confirmed by Aletta Edouard [phone]) on 06/30/2021 7:06:34 AM ? ?Radiology ?DG Chest Port 1 View ? ?Result Date: 06/30/2021 ?CLINICAL DATA:  Sepsis.  Fever and cough with bloody sputum. EXAM: PORTABLE CHEST 1 VIEW COMPARISON:  01/08/2020 FINDINGS: Previous median sternotomy and CABG procedure. Aortic atherosclerotic calcifications. There is airspace opacification within the right upper lobe and right lower lobe concerning for pneumonia. Left lung clear. No signs of pleural effusion or edema. IMPRESSION: Right upper lobe and right lower lobe airspace opacification concerning for pneumonia. Electronically Signed   By: Kerby Moors M.D.   On: 06/30/2021 07:26   ? ?Procedures ?Marland KitchenCritical Care ?Performed by: Hayden Rasmussen, MD ?Authorized by: Hayden Rasmussen, MD  ? ?Critical care provider statement:  ?  Critical care time (minutes):  45 ?  Critical care time was exclusive of:  Separately billable procedures and treating other patients ?  Critical care was necessary to treat or prevent imminent or life-threatening deterioration of the following  conditions:  Respiratory failure and shock ?  Critical care was time spent personally by me on the following activities:  Development of treatment plan with patient or surrogate, discussions with consultants, evaluati

## 2021-06-30 NOTE — Progress Notes (Signed)
ANTICOAGULATION CONSULT NOTE  ? ?Pharmacy Consult for warfarin ?Indication: mechanical valve ? ?No Known Allergies ? ?Patient Measurements: ?Height: '5\' 9"'$  (175.3 cm) ?Weight: 120.2 kg (265 lb) ?IBW/kg (Calculated) : 70.7 ? ?Vital Signs: ?Temp: 98.7 ?F (37.1 ?C) (03/18 1526) ?Temp Source: Oral (03/18 1526) ?BP: 113/69 (03/18 1526) ?Pulse Rate: 70 (03/18 1526) ? ?Labs: ?Recent Labs  ?  06/30/21 ?0653  ?HGB 13.1  ?HCT 40.1  ?PLT 247  ?APTT 30  ?LABPROT 23.4*  ?INR 2.1*  ?CREATININE 1.07  ? ? ?Estimated Creatinine Clearance: 94 mL/min (by C-G formula based on SCr of 1.07 mg/dL). ? ? ?Medical History: ?Past Medical History:  ?Diagnosis Date  ? Blood dyscrasia   ? lupus anti coagulant  ? Cancer ([place]) 08/2016  ? renal   ? Chronic anticoagulation   ? INR goal 2.5-3.5  ? COPD (chronic obstructive pulmonary disease) ([place])   ? Coronary artery disease involving native coronary artery of native heart without angina pectoris 2018  ? s/p SVG to RCA  ? Erectile dysfunction   ? GERD (gastroesophageal reflux disease)   ? History of bowel infarction   ? Hx of Hodgkin's lymphoma   ? Hx of lupus anticoagulant disorder   ? history of blood work showing lupus  ? Hyperlipidemia   ? Hypertension   ? Hypothyroidism   ? Pneumonia   ? hx  ? RBBB 10/09/2017  ? S/P aortic valve replacement with mechanical valve 11/29/2016  ? 23 mm Sorin Carbomedics top hat bileaflet mechanical valve  ? S/P CABG x 1 11/29/2016  ? Severe aortic stenosis   ? Thyroid disease   ? Tobacco dependency   ? ? ?Medications:  ?Medications Prior to Admission  ?Medication Sig Dispense Refill Last Dose  ? aspirin EC 81 MG tablet Take 81 mg by mouth daily. Swallow whole.   06/29/2021  ? atorvastatin (LIPITOR) 80 MG tablet Take 1 tablet (80 mg total) by mouth daily. 90 tablet 3 06/29/2021  ? budesonide-formoterol (SYMBICORT) 160-4.5 MCG/ACT inhaler Inhale 2 puffs into the lungs 2 (two) times daily.   06/29/2021  ? doxylamine, Sleep, (UNISOM) 25 MG tablet Take 50 mg by mouth at  bedtime as needed.   06/29/2021  ? ezetimibe (ZETIA) 10 MG tablet Take 1 tablet (10 mg total) by mouth daily. Please make yearly appt with Dr. [facility] Pax for November 2022 for future refills. Thank you 1st attempt 90 tablet 0 06/29/2021  ? Homeopathic Products (LEG CRAMP RELIEF PO) Take 1 tablet by mouth daily as needed.   06/29/2021  ? levothyroxine (SYNTHROID) 125 MCG tablet Take 125 mcg by mouth every morning.   06/29/2021  ? losartan (COZAAR) 100 MG tablet Take 1 tablet (100 mg total) by mouth daily. Pt needs to make appt with provider for further refills - 1st attempt 90 tablet 0 06/29/2021  ? Magnesium 125 MG CAPS Take 125 mg by mouth daily.     ? metFORMIN (GLUCOPHAGE) 500 MG tablet Take 500 mg by mouth daily.   06/29/2021  ? metoprolol tartrate (LOPRESSOR) 50 MG tablet Take 1 tablet (50 mg total) by mouth 2 (two) times daily. Please make yearly appt with Dr. [facility] Pax for November 2022 for future refills. Thank you 1st attempt 180 tablet 0 06/29/2021 at 2300  ? Omega-3 Fatty Acids (FISH OIL PO) Take 3 capsules by mouth at bedtime. 1200/360 MG   06/29/2021  ? Potassium Chloride (K+ POTASSIUM PO) Take 1 tablet by mouth daily.   06/29/2021  ? PROAIR HFA 108 (90  Base) MCG/ACT inhaler Inhale 2 puffs into the lungs daily as needed for wheezing or shortness of breath.    02/13/2021  ? TRULICITY 0.93 OI/7.1IW SOPN Inject 0.75 mg into the skin once a week. Wednesdays     ? warfarin (COUMADIN) 2.5 MG tablet Take 2.5 mg by mouth. Tuesday, Thursday, saturaday   06/26/2021  ? warfarin (COUMADIN) 5 MG tablet Take 1 tablet (5 mg total) by mouth daily. Managed by [facility] (Patient taking differently: Take 5 mg by mouth. On Sunday, Monday, Wednesday and Friday.)   06/29/2021 at 2300  ? chlorpheniramine-HYDROcodone (TUSSIONEX PENNKINETIC ER) 10-8 MG/5ML SUER Take 5 mLs by mouth every 12 (twelve) hours as needed for cough. (Patient not taking: Reported on 06/30/2021) 140 mL 0 Not Taking  ? methocarbamol (ROBAXIN) 500 MG tablet Take 1  tablet (500 mg total) by mouth every 8 (eight) hours as needed for muscle spasms. (Patient not taking: Reported on 06/30/2021) 20 tablet 0 Not Taking  ? ?Scheduled:  ? aspirin EC  81 mg Oral Daily  ? atorvastatin  80 mg Oral Daily  ? dextromethorphan-guaiFENesin  1 tablet Oral BID  ? ezetimibe  10 mg Oral Daily  ? fluticasone furoate-vilanterol  1 puff Inhalation Daily  ? insulin aspart  0-15 Units Subcutaneous TID WC  ? insulin aspart  0-5 Units Subcutaneous QHS  ? insulin glargine-yfgn  5 Units Subcutaneous BID  ? [START ON 07/01/2021] levothyroxine  112 mcg Oral q morning  ? [START ON 07/01/2021] metoprolol tartrate  50 mg Oral BID  ? warfarin  7.5 mg Oral Once  ? [START ON 07/01/2021] Warfarin - Pharmacist Dosing Inpatient   Does not apply [id number]  ? ?PRN: acetaminophen **OR** acetaminophen, albuterol, ondansetron **OR** ondansetron (ZOFRAN) IV, polyethylene glycol ? ?Assessment: ?38 yoM with PMH CAD s/p CABG, AV stenosis s/p mech valve on warfarin (INR goal 2.5-3.5), HTN, DM2, RCC, HL not on chemo, admitted for sepsis d/t CAP. Pharmacy to continue warfarin during admission. ? ?Baseline INR subtherapeutic ?Prior anticoagulation: warfarin 5 mg daily except 2.5 mg on TTS; LD 3/17  ? ?Significant events: ? ?Today, 06/30/2021: ?CBC: WNL ?INR SUBtherapeutic despite (purportedly) no missed warfarin doses ?Major drug interactions: starting broad spectrum antibiotics - can increase warfarin sensitivity ?No bleeding issues per nursing ?Eating 100% of meals ? ?Goal of Therapy: ?INR 2-3 ? ?Plan: ?Warfarin 7.5 mg PO tonight at 16:00 ?MD did not wish to bridge subtherapeutic warfarin ?Daily INR ?CBC daily x 3 days given recent reports of hematemesis ?Monitor for signs of bleeding or thrombosis ? ? ?Reuel Boom, PharmD, BCPS ?[phone] ?06/30/2021, 4:34 PM ? ? ?

## 2021-06-30 NOTE — Assessment & Plan Note (Signed)
Currently in sinus rhythm. ?-Continue metoprolol and Coumadin ?

## 2021-06-30 NOTE — Assessment & Plan Note (Signed)
Estimated body mass index is 39.13 kg/m? as calculated from the following: ?  Height as of this encounter: '5\' 9"'$  (1.753 m). ?  Weight as of this encounter: 120.2 kg.  ? ?This will complicate overall prognosis. ?

## 2021-06-30 NOTE — Plan of Care (Signed)
I was notified to directly admit this patient from [facility].  ?61 y/o male, on coumadin for mechanical valve has a cough and fatigue, fever 102, 91% on room air, hemoptysis, WBC count of 15, large right sided infiltrate, BP 103/52. Ordered 1 L NS, Ceftriaxone and Azithromycin.  ? ? ?Debbe Odea, MD ?

## 2021-06-30 NOTE — H&P (Addendum)
?History and Physical  ? ? ?Patient: Michael Delgado NLZ:[id number] DOB: 05-Aug-1960 ?DOA: 06/30/2021 ?DOS: the patient was seen and examined on 06/30/2021 ?PCP: [name], MD  ?Patient coming from: Home ? ?Chief Complaint:  ?Chief Complaint  ?Patient presents with  ? Fever  ? ?HPI: Michael Delgado is a 61 y.o. male with medical history significant of COPD, CAD s/p CABG, aortic valve stenosis s/p mechanical valve replacement, hypertension, diabetes, hypothyroidism, renal cell carcinoma and Hodgkin lymphoma not on any current treatment came to ED with complaint of cough, chest congestion, fever and chills for the past 1 day. ? ?Per patient he started getting worsening of his normal cough with greenish sputum yesterday, last night he had 1 episode of hemoptysis and then earlier in the morning he had 1 hematemesis, he saw some blood in his vomitus, he got worried due to him being on Coumadin so came to ED for evaluation.  Per patient he gets pneumonia every year.  He was not vaccinated against COVID-19 but denies any sick contacts.  Denies any chest pain or shortness of breath.  No orthopnea or PND. ?Patient did not had any more episode of hemoptysis or hematemesis since 1 as noted above. ? ?Patient denies any recent change in his appetite or bowel habits.  No urinary symptoms. ? ?ED course.  On arrival to ED he was febrile with temperature of 102.1, saturating around 90% on room air and was placed on 2 L of oxygen with improvement in saturation to 97 to 98%.  No history of prior oxygen use.  Labs pertinent for leukocytosis 0.2, mildly elevated BUN at 26, INR of 2.1, blood glucose level of 180.,  Lactic acid within normal limits.  Chest x-ray with concern of right upper and lower lobe infiltrate.  COVID-19 and influenza PCR negative. ?EKG with sinus rhythm, first-degree heart block, RBBB and LPFB, no acute changes. ? ?Patient is being admitted for concern of community-acquired pneumonia. ? ? ?Review of Systems: As mentioned  in the history of present illness. All other systems reviewed and are negative. ?Past Medical History:  ?Diagnosis Date  ? Blood dyscrasia   ? lupus anti coagulant  ? Cancer ([place]) 08/2016  ? renal   ? Chronic anticoagulation   ? INR goal 2.5-3.5  ? COPD (chronic obstructive pulmonary disease) ([place])   ? Coronary artery disease involving native coronary artery of native heart without angina pectoris 2018  ? s/p SVG to RCA  ? Erectile dysfunction   ? GERD (gastroesophageal reflux disease)   ? History of bowel infarction   ? Hx of Hodgkin's lymphoma   ? Hx of lupus anticoagulant disorder   ? history of blood work showing lupus  ? Hyperlipidemia   ? Hypertension   ? Hypothyroidism   ? Pneumonia   ? hx  ? RBBB 10/09/2017  ? S/P aortic valve replacement with mechanical valve 11/29/2016  ? 23 mm Sorin Carbomedics top hat bileaflet mechanical valve  ? S/P CABG x 1 11/29/2016  ? Severe aortic stenosis   ? Thyroid disease   ? Tobacco dependency   ? ?Past Surgical History:  ?Procedure Laterality Date  ? ABDOMINAL SURGERY  1999  ? AORTIC VALVE REPLACEMENT N/A 11/29/2016  ? Procedure: AORTIC VALVE REPLACEMENT (AVR) using 23MM Carbonmedics Valve;  Surgeon: Rexene Alberts, MD;  Location: [facility];  Service: Open Heart Surgery;  Laterality: N/A;  ? BOWEL INFARCTION  1997  ? HAD SURGERY AND FOUND TO HAVE LUPUS ANTICOAGULANT  ? CORONARY ARTERY  BYPASS GRAFT N/A 11/29/2016  ? Procedure: CORONARY ARTERY BYPASS GRAFTING (CABG) x one, using right leg greater saphenous vein harvested endoscopically;  Surgeon: Rexene Alberts, MD;  Location: [facility];  Service: Open Heart Surgery;  Laterality: N/A;  ? HODGKIN  LYMPHOMA WITH RADIATION AND CHEMOTHERAPY  1984  ? RIGHT/LEFT HEART CATH AND CORONARY ANGIOGRAPHY N/A 10/23/2016  ? Procedure: Right/Left Heart Cath and Coronary Angiography;  Surgeon: Troy Sine, MD;  Location: [facility];  Service: Cardiovascular;  Laterality: N/A;  ? ROBOTIC ASSITED PARTIAL NEPHRECTOMY Right 09/11/2016  ?  Procedure: XI ROBOTIC ASSISTED RETROPERITONEAL PARTIAL NEPHRECTOMY;  Surgeon: Alexis Frock, MD;  Location: [facility];  Service: Urology;  Laterality: Right;  ? TEE WITHOUT CARDIOVERSION N/A 11/29/2016  ? Procedure: TRANSESOPHAGEAL ECHOCARDIOGRAM (TEE);  Surgeon: Rexene Alberts, MD;  Location: [facility];  Service: Open Heart Surgery;  Laterality: N/A;  ? THORACIC AORTOGRAM N/A 10/23/2016  ? Procedure: Thoracic Aortogram;  Surgeon: Troy Sine, MD;  Location: [facility];  Service: Cardiovascular;  Laterality: N/A;  ? ?Social History:  reports that he quit smoking about 7 years ago. He has never used smokeless tobacco. He reports that he does not drink alcohol and does not use drugs. ? ?No Known Allergies ? ?Family History  ?Problem Relation Age of Onset  ? Arthritis Mother   ? Hypertension Mother   ? High Cholesterol Mother   ? Cancer - Ovarian Mother   ? Diabetes Father   ? Hypertension Father   ? Arthritis Father   ? High Cholesterol Father   ? Arthritis Sister   ? Heart disease Brother   ? Hypertension Brother   ? Healthy Brother   ? Asthma Sister   ? Heart disease Sister   ? ? ?Prior to Admission medications   ?Medication Sig Start Date End Date Taking? Authorizing Provider  ?aspirin EC 81 MG tablet Take 81 mg by mouth daily. Swallow whole.    [provider]  ?atorvastatin (LIPITOR) 80 MG tablet Take 1 tablet (80 mg total) by mouth daily. 11/23/18   Daune Perch, NP  ?budesonide-formoterol (SYMBICORT) 80-4.5 MCG/ACT inhaler Inhale 1 puff into the lungs at bedtime as needed (shortness of breath).    [provider]  ?chlorpheniramine-HYDROcodone (TUSSIONEX PENNKINETIC ER) 10-8 MG/5ML SUER Take 5 mLs by mouth every 12 (twelve) hours as needed for cough. 01/08/20   Isla Pence, MD  ?ezetimibe (ZETIA) 10 MG tablet Take 1 tablet (10 mg total) by mouth daily. Please make yearly appt with Dr. [facility] Pax for November 2022 for future refills. Thank you 1st attempt 01/19/21   Sueanne Margarita, MD   ?levothyroxine (SYNTHROID) 112 MCG tablet Take 112 mcg by mouth every morning. 01/26/20   [provider]  ?losartan (COZAAR) 100 MG tablet Take 1 tablet (100 mg total) by mouth daily. Pt needs to make appt with provider for further refills - 1st attempt 12/15/20   Sueanne Margarita, MD  ?Magnesium Oxide (MAG-CAPS PO) Take 1 capsule by mouth daily.    [provider]  ?methocarbamol (ROBAXIN) 500 MG tablet Take 1 tablet (500 mg total) by mouth every 8 (eight) hours as needed for muscle spasms. 12/12/17   Geradine Girt, DO  ?metoprolol tartrate (LOPRESSOR) 50 MG tablet Take 1 tablet (50 mg total) by mouth 2 (two) times daily. Please make yearly appt with Dr. [facility] Pax for November 2022 for future refills. Thank you 1st attempt 01/19/21   Sueanne Margarita, MD  ?Omega-3  Fatty Acids (FISH OIL PO) Take 3 capsules by mouth at bedtime. 1200/360 MG    [provider]  ?PROAIR HFA 108 (90 Base) MCG/ACT inhaler Inhale 2 puffs into the lungs daily as needed for wheezing or shortness of breath.  12/19/16   [provider]  ?traZODone (DESYREL) 50 MG tablet Take 1 tablet (50 mg total) by mouth at bedtime. 12/23/18   Dohmeier, Asencion Partridge, MD  ?warfarin (COUMADIN) 5 MG tablet Take 1 tablet (5 mg total) by mouth daily. Managed by [facility] 06/29/18   [name], [place]  ? ? ?Physical Exam: ?Vitals:  ? 06/30/21 0758 06/30/21 0815 06/30/21 1001 06/30/21 1103  ?BP:  (!) 103/52 128/60 116/73  ?Pulse:  82 74 82  ?Resp:  16 (!) 23 17  ?Temp: (!) 101.2 ?F (38.4 ?C)   98.9 ?F (37.2 ?C)  ?TempSrc: Oral   Oral  ?SpO2:  96% 98% 97%  ?Weight:      ?Height:      ? ?Vitals:  ? 06/30/21 0758 06/30/21 0815 06/30/21 1001 06/30/21 1103  ?BP:  (!) 103/52 128/60 116/73  ?Pulse:  82 74 82  ?Resp:  16 (!) 23 17  ?Temp: (!) 101.2 ?F (38.4 ?C)   98.9 ?F (37.2 ?C)  ?TempSrc: Oral   Oral  ?SpO2:  96% 98% 97%  ?Weight:      ?Height:      ? ?General: Vital signs reviewed.  Patient is well-developed and well-nourished, in no acute  distress and cooperative with exam.  ?Head: Normocephalic and atraumatic. ?Eyes: EOMI, conjunctivae normal, no scleral icterus.  ?Neck: Supple, trachea midline, normal ROM, no JVD,  ?Cardiovascular: RRR, S1 no

## 2021-06-30 NOTE — Assessment & Plan Note (Signed)
Patient met sepsis criteria with fever, mild tachypnea and leukocytosis, secondary to community-acquired pneumonia. ?Patient also has mild hypoxia requiring up to 2 L of oxygen, saturation never dropped below 90%. ?-Admit to MedSurg ?-Sputum culture ?-Urinary strep pneumo antigen ?-Legionella pneumobilia ?-MRSA PCR ?-Ceftriaxone for 5 days ?-Zithromax for 5 days ?-Supplemental oxygen-wean as tolerated ?-Supportive care ?

## 2021-06-30 NOTE — Assessment & Plan Note (Addendum)
S/p CABG.  No chest pain.  EKG without any acute changes ?-Continue home dose of aspirin, Lipitor, Zetia and metoprolol. ?

## 2021-06-30 NOTE — Assessment & Plan Note (Signed)
Continue with outpatient oncology follow-up. ?

## 2021-06-30 NOTE — Assessment & Plan Note (Signed)
Being followed up as an outpatient with oncology ?

## 2021-06-30 NOTE — Assessment & Plan Note (Signed)
Currently blood pressure within goal.  Patient was on losartan and metformin at home.  Concern of hypotension with sepsis ?-Continue with metformin ?-Holding losartan-we will restart if blood pressure trending up. ?

## 2021-06-30 NOTE — Discharge Instructions (Addendum)
Please see your doctor later this week for a follow up. ? ?You were cared for by a hospitalist during your hospital stay. If you have any questions about your discharge medications or the care you received while you were in the hospital after you are discharged, you can call the unit and asked to speak with the hospitalist on call if the hospitalist that took care of you is not available. Once you are discharged, your primary care physician will handle any further medical issues.  ? ?Please note that NO REFILLS for any discharge medications will be authorized once you are discharged, as it is imperative that you return to your primary care physician (or establish a relationship with a primary care physician if you do not have one) for your aftercare needs so that they can reassess your need for medications and monitor your lab values. ? ?Please take all your medications with you for your next visit with your Primary MD. ?Please ask your Primary MD to get all Hospital records sent to his/her office. ?Please request your Primary MD to go over all hospital test results at the follow up.  ? ?If you experience worsening of your admission symptoms, develop shortness of breath, chest pain, suicidal or homicidal thoughts or a life threatening emergency, you must seek medical attention immediately by calling 911 or calling your MD. ?  ?You must read the complete instructions/literature along with all the possible adverse reactions/side effects for all the medicines you take including new medications that have been prescribed to you. Take new medicines after you have completely understood and accpet all the possible adverse reactions/side effects.  ?  ?Do not drive when taking pain medications or sedatives.  ?   ?Do not take more than prescribed Pain, Sleep and Anxiety Medications ?  ?If you have smoked or chewed Tobacco in the last 2 yrs please stop. Stop any regular alcohol  and or recreational drug use. ?  ?Wear Seat belts  while driving. ? ?

## 2021-06-30 NOTE — Assessment & Plan Note (Addendum)
S/p mechanical valve replacement. ?Patient is on Coumadin with INR of 2.1.  Concern of 1 episode of hematemesis, no more vomiting since then.  Hemoglobin seems stable. ?-Continue Coumadin per pharmacy ?-Monitor INR or any other active bleeding ?

## 2021-07-01 DIAGNOSIS — Z85528 Personal history of other malignant neoplasm of kidney: Secondary | ICD-10-CM

## 2021-07-01 DIAGNOSIS — J189 Pneumonia, unspecified organism: Secondary | ICD-10-CM | POA: Diagnosis not present

## 2021-07-01 DIAGNOSIS — R6510 Systemic inflammatory response syndrome (SIRS) of non-infectious origin without acute organ dysfunction: Secondary | ICD-10-CM

## 2021-07-01 DIAGNOSIS — R042 Hemoptysis: Secondary | ICD-10-CM | POA: Diagnosis not present

## 2021-07-01 DIAGNOSIS — I10 Essential (primary) hypertension: Secondary | ICD-10-CM | POA: Diagnosis not present

## 2021-07-01 DIAGNOSIS — E119 Type 2 diabetes mellitus without complications: Secondary | ICD-10-CM | POA: Diagnosis not present

## 2021-07-01 LAB — CBC
HCT: 37.5 % — ABNORMAL LOW (ref 39.0–52.0)
Hemoglobin: 12 g/dL — ABNORMAL LOW (ref 13.0–17.0)
MCH: 27.5 pg (ref 26.0–34.0)
MCHC: 32 g/dL (ref 30.0–36.0)
MCV: 85.8 fL (ref 80.0–100.0)
Platelets: 219 10*3/uL (ref 150–400)
RBC: 4.37 MIL/uL (ref 4.22–5.81)
RDW: 14.6 % (ref 11.5–15.5)
WBC: 11.7 10*3/uL — ABNORMAL HIGH (ref 4.0–10.5)
nRBC: 0 % (ref 0.0–0.2)

## 2021-07-01 LAB — HEMOGLOBIN A1C
Hgb A1c MFr Bld: 8 % — ABNORMAL HIGH (ref 4.8–5.6)
Mean Plasma Glucose: 182.9 mg/dL

## 2021-07-01 LAB — HIV ANTIBODY (ROUTINE TESTING W REFLEX): HIV Screen 4th Generation wRfx: NONREACTIVE

## 2021-07-01 LAB — PROTIME-INR
INR: 2.4 — ABNORMAL HIGH (ref 0.8–1.2)
Prothrombin Time: 26.3 seconds — ABNORMAL HIGH (ref 11.4–15.2)

## 2021-07-01 LAB — GLUCOSE, CAPILLARY: Glucose-Capillary: 134 mg/dL — ABNORMAL HIGH (ref 70–99)

## 2021-07-01 MED ORDER — AZITHROMYCIN 500 MG PO TABS: ORAL_TABLET | ORAL | 0 refills | Status: AC

## 2021-07-01 MED ORDER — DM-GUAIFENESIN ER 30-600 MG PO TB12
1.0000 | ORAL_TABLET | Freq: Two times a day (BID) | ORAL | 0 refills | Status: AC | PRN
Start: 2021-07-01 — End: 2021-07-15

## 2021-07-01 MED ORDER — WARFARIN SODIUM 5 MG PO TABS: 5.0000 mg | ORAL_TABLET | Freq: Once | ORAL | Status: DC

## 2021-07-01 MED ORDER — CEFUROXIME AXETIL 500 MG PO TABS: 500.0000 mg | ORAL_TABLET | Freq: Two times a day (BID) | ORAL | 0 refills | Status: DC

## 2021-07-01 MED ORDER — AZITHROMYCIN 250 MG PO TABS
500.0000 mg | ORAL_TABLET | Freq: Every day | ORAL | Status: DC
Start: 2021-07-02 — End: 2021-07-01

## 2021-07-01 NOTE — Progress Notes (Signed)
SATURATION QUALIFICATIONS: (This note is used to comply with regulatory documentation for home oxygen)  Patient Saturations on Room Air at Rest = 97%  Patient Saturations on Room Air while Ambulating = 93%  

## 2021-07-01 NOTE — Discharge Summary (Signed)
Physician Discharge Summary  ?Michael Delgado WLN:[id number] DOB: 1960-07-10 DOA: 06/30/2021 ? ?PCP: Michael Fess, MD ? ?Admit date: 06/30/2021 ?Discharge date: 07/01/2021 ?Discharging to: home ?Recommendations for Outpatient Follow-up:  ?F/u in 1 wk ? ?Consults:  ?none ?Procedures:  ?none ? ? ?Discharge Diagnoses:  ? Active Problems: ?  CAP (community acquired pneumonia) ?  Nonrheumatic aortic valve stenosis ?  Coronary artery disease involving native coronary artery of native heart without angina pectoris ?  Essential hypertension ?  Controlled type 2 diabetes mellitus without complication, without long-term current use of insulin ([place]) ?  Paroxysmal atrial fibrillation (HCC) ?  History of renal cell cancer ?  Personal history of Hodgkin lymphoma ?  Morbid obesity due to excess calories (Michael [facility]) ?  Hemoptysis ? ? ? ? ?Hospital Course: ?Michael Delgado is a 61 y.o. male with medical history significant of COPD, CAD s/p CABG, aortic valve stenosis s/p mechanical valve replacement, hypertension, diabetes, hypothyroidism, renal cell carcinoma and Hodgkin lymphoma not on any current treatment came to ED with complaint of cough, chest congestion, fever and chills for 1 day. ? ?In ED. Temp 102.1, pulse ox 90% on room air.  ? ?On exam today, he is not hypoxic, has clear lungs but has a mild cough. He states he is doing much better than yesterday and asks to go home.  ?  ? ?Assessment and Plan: ?  ? ?CAP (community acquired pneumonia) ?Chest x-ray concerning for right upper and lower lobe infiltrate. ?- has received 2 days of Ceftriaxone and Azithromycin-  ?- fever resolved, WBC count improved, pulse ox improved- and 93% on exertion today  ?- hemoptysis is much less today ?- doing much better and stable to dc home ? ?Nonrheumatic aortic valve stenosis ?S/p mechanical valve replacement. ?Patient is on Coumadin with INR of 2.1.    ?-Continue Coumadin per pharmacy ?  ?Coronary artery disease involving native coronary artery of native  heart without angina pectoris ?S/p CABG.  No chest pain.  EKG without any acute changes ?-Continue home dose of aspirin, Lipitor, Zetia and metoprolol. ? ?Controlled type 2 diabetes mellitus without complication, without long-term current use of insulin (Michael Delgado) ?Per patient he was recently diagnosed with type 2 diabetes mellitus and was started on metformin by PCP. ? - A1c is 8.0 ? ?Essential hypertension ?Paroxysmal atrial fibrillation (HCC) ?Currently in sinus rhythm. ?-Continue metoprolol and Coumadin ? ?History of renal cell cancer ?Being followed up as an outpatient with oncology ? ?Personal history of Hodgkin lymphoma ?Continue with outpatient oncology follow-up. ? ?Morbid obesity due to excess calories (Michael Delgado) ?Estimated body mass index is 39.13 kg/m? as calculated from the following: ?  Height as of this encounter: '5\' 9"'$  (1.753 m). ?  Weight as of this encounter: 120.2 kg.  ?  ? ? ?Discharge Instructions ? ?Discharge Instructions   ? ? Diet - low sodium heart healthy   Complete by: As directed ?  ? Increase activity slowly   Complete by: As directed ?  ? ?  ? ?Allergies as of 07/01/2021   ?No Known Allergies ?  ? ?  ?Medication List  ?  ? ?TAKE these medications   ? ?aspirin EC 81 MG tablet ?Take 81 mg by mouth daily. Swallow whole. ?  ?atorvastatin 80 MG tablet ?Commonly known as: LIPITOR ?Take 1 tablet (80 mg total) by mouth daily. ?  ?azithromycin 500 MG tablet ?Commonly known as: ZITHROMAX ?Take last tab tomorrow AM ?Start taking on: July 02, 2021 ?  ?budesonide-formoterol 160-4.5 MCG/ACT  inhaler ?Commonly known as: SYMBICORT ?Inhale 2 puffs into the lungs 2 (two) times daily. ?  ?cefUROXime 500 MG tablet ?Commonly known as: CEFTIN ?Take 1 tablet (500 mg total) by mouth 2 (two) times daily with a meal. ?Start taking on: July 02, 2021 ?  ?dextromethorphan-guaiFENesin 30-600 MG 12hr tablet ?Commonly known as: [place] DM ?Take 1 tablet by mouth 2 (two) times daily as needed for up to 14 days for cough. ?   ?doxylamine (Sleep) 25 MG tablet ?Commonly known as: UNISOM ?Take 50 mg by mouth at bedtime as needed. ?  ?ezetimibe 10 MG tablet ?Commonly known as: ZETIA ?Take 1 tablet (10 mg total) by mouth daily. Please make yearly appt with Dr. Radford Pax for November 2022 for future refills. Thank you 1st attempt ?  ?FISH OIL PO ?Take 3 capsules by mouth at bedtime. 1200/360 MG ?  ?K+ POTASSIUM PO ?Take 1 tablet by mouth daily. ?  ?LEG CRAMP RELIEF PO ?Take 1 tablet by mouth daily as needed. ?  ?levothyroxine 125 MCG tablet ?Commonly known as: SYNTHROID ?Take 125 mcg by mouth every morning. ?  ?losartan 100 MG tablet ?Commonly known as: COZAAR ?Take 1 tablet (100 mg total) by mouth daily. Pt needs to make appt with provider for further refills - 1st attempt ?  ?Magnesium 125 MG Caps ?Take 125 mg by mouth daily. ?  ?metFORMIN 500 MG tablet ?Commonly known as: GLUCOPHAGE ?Take 500 mg by mouth daily. ?  ?methocarbamol 500 MG tablet ?Commonly known as: Robaxin ?Take 1 tablet (500 mg total) by mouth every 8 (eight) hours as needed for muscle spasms. ?  ?metoprolol tartrate 50 MG tablet ?Commonly known as: LOPRESSOR ?Take 1 tablet (50 mg total) by mouth 2 (two) times daily. Please make yearly appt with Dr. Radford Pax for November 2022 for future refills. Thank you 1st attempt ?  ?ProAir HFA 108 (90 Base) MCG/ACT inhaler ?Generic drug: albuterol ?Inhale 2 puffs into the lungs daily as needed for wheezing or shortness of breath. ?  ?Trulicity 7.98 XQ/1.1HE Sopn ?Generic drug: Dulaglutide ?Inject 0.75 mg into the skin once a week. Wednesdays ?  ?warfarin 2.5 MG tablet ?Commonly known as: COUMADIN ?Take 2.5 mg by mouth. Tuesday, Thursday, saturaday ?What changed: Another medication with the same name was changed. Make sure you understand how and when to take each. ?  ?warfarin 5 MG tablet ?Commonly known as: COUMADIN ?Take 1 tablet (5 mg total) by mouth daily. Managed by [facility] ?What changed:  ?when to take this ?additional  instructions ?  ? ?  ? ? Follow-up Information   ? ? Michael Fess, MD Follow up.   ?Specialty: Family Medicine ?Why: f/u later this week ?Contact information: ?[address] ?[place] [address] ?[phone] ? ? ?  ?  ? ? Sueanne Margarita, MD Follow up.   ?Specialty: Cardiology ?Contact information: ?[address] ?[address] ?[place] [address] ?[phone] ? ? ?  ?  ? ?  ?  ? ?  ? ?  ?  ?The results of significant diagnostics from this hospitalization (including imaging, microbiology, ancillary and laboratory) are listed below for reference.   ? ?DG Chest Port 1 Delgado ? ?Result Date: 06/30/2021 ?CLINICAL DATA:  Sepsis.  Fever and cough with bloody sputum. EXAM: PORTABLE CHEST 1 Delgado COMPARISON:  01/08/2020 FINDINGS: Previous median sternotomy and CABG procedure. Aortic atherosclerotic calcifications. There is airspace opacification within the right upper lobe and right lower lobe concerning for pneumonia. Left lung clear.  No signs of pleural effusion or edema. IMPRESSION: Right upper lobe and right lower lobe airspace opacification concerning for pneumonia. Electronically Signed   By: Kerby Moors M.D.   On: 06/30/2021 07:26   ?Labs: ?BNP (last 3 results) ?No results for input(s): BNP in the last 8760 hours. ?Basic Metabolic Panel: ?Recent Labs  ?Lab 06/30/21 ?8421  ?NA 133*  ?K 4.8  ?CL 101  ?CO2 24  ?GLUCOSE 188*  ?BUN 26*  ?CREATININE 1.07  ?CALCIUM 8.6*  ? ?Liver Function Tests: ?Recent Labs  ?Lab 06/30/21 ?0312  ?AST 27  ?ALT 27  ?ALKPHOS 75  ?BILITOT 1.0  ?PROT 7.7  ?ALBUMIN 3.9  ? ?No results for input(s): LIPASE, AMYLASE in the last 168 hours. ?No results for input(s): AMMONIA in the last 168 hours. ?CBC: ?Recent Labs  ?Lab 06/30/21 ?0653 07/01/21 ?8118  ?WBC 15.2* 11.7*  ?NEUTROABS 13.0*  --   ?HGB 13.1 12.0*  ?HCT 40.1 37.5*  ?MCV 84.2 85.8  ?PLT 247 219  ? ?Cardiac Enzymes: ?No results for input(s): CKTOTAL, CKMB, CKMBINDEX, TROPONINI in the last 168 hours. ?BNP: ?Invalid input(s):  POCBNP ?CBG: ?Recent Labs  ?Lab 06/30/21 ?1231 06/30/21 ?1610 07/01/21 ?0719  ?GLUCAP 129* 110* 134*  ? ?D-Dimer ?No results for input(s): DDIMER in the last 72 hours. ?Hgb A1c ?Recent Labs  ?  07/01/21 ?8677  ?HGBA1C 8

## 2021-07-01 NOTE — Progress Notes (Addendum)
ANTICOAGULATION CONSULT NOTE  ? ?Pharmacy Consult for warfarin ?Indication: mechanical valve ? ?No Known Allergies ? ?Patient Measurements: ?Height: '5\' 9"'$  (175.3 cm) ?Weight: 120.2 kg (265 lb) ?IBW/kg (Calculated) : 70.7 ? ?Vital Signs: ?Temp: 98.1 ?F (36.7 ?C) (03/19 0529) ?Temp Source: Oral (03/19 0529) ?BP: 133/83 (03/19 0529) ?Pulse Rate: 80 (03/19 0529) ? ?Labs: ?Recent Labs  ?  06/30/21 ?0653 07/01/21 ?2956  ?HGB 13.1 12.0*  ?HCT 40.1 37.5*  ?PLT 247 219  ?APTT 30  --   ?LABPROT 23.4* 26.3*  ?INR 2.1* 2.4*  ?CREATININE 1.07  --   ? ? ? ?Estimated Creatinine Clearance: 94 mL/min (by C-G formula based on SCr of 1.07 mg/dL). ? ? ?Assessment: ?70 yoM with PMH CAD s/p CABG, AV stenosis s/p mech valve on warfarin (INR goal 2.5-3.5), HTN, DM2, RCC, HL not on chemo, admitted for sepsis d/t CAP. Pharmacy to continue warfarin during admission. ?admission INR subtherapeutic ?Prior anticoagulation: warfarin 5 mg daily except 2.5 mg on TTS; LD 3/17  ? ?07/01/2021 ?INR 2.4. almost to 2.5-3.5 goal for valve ?No bleeding reported.  ? ?Goal of Therapy: ?INR 2.5-3.5 ? ?Plan: ?Warfarin 5 mg PO tonight at 16:00 ?Daily INR ?CBC daily x 3 days given recent reports of hematemesis ?Monitor for signs of bleeding or thrombosis ?Azithromycin to po per protocol ? ? ?Eudelia Bunch, Pharm.D ?07/01/2021 11:45 AM ? ? ?

## 2021-07-02 LAB — GLUCOSE, CAPILLARY
Glucose-Capillary: 125 mg/dL — ABNORMAL HIGH (ref 70–99)
Glucose-Capillary: 96 mg/dL (ref 70–99)

## 2021-07-02 LAB — EXPECTORATED SPUTUM ASSESSMENT W GRAM STAIN, RFLX TO RESP C

## 2021-07-03 LAB — LEGIONELLA PNEUMOPHILA SEROGP 1 UR AG: L. pneumophila Serogp 1 Ur Ag: NEGATIVE

## 2021-07-03 LAB — CULTURE, RESPIRATORY W GRAM STAIN

## 2021-07-05 LAB — CULTURE, BLOOD (ROUTINE X 2)
Culture: NO GROWTH
Culture: NO GROWTH
Special Requests: ADEQUATE
Special Requests: ADEQUATE

## 2021-11-01 ENCOUNTER — Ambulatory Visit: Payer: Managed Care, Other (non HMO) | Admitting: Cardiology

## 2022-03-04 ENCOUNTER — Ambulatory Visit: Payer: Managed Care, Other (non HMO) | Admitting: Cardiology

## 2022-05-28 ENCOUNTER — Emergency Department (HOSPITAL_BASED_OUTPATIENT_CLINIC_OR_DEPARTMENT_OTHER): Payer: Managed Care, Other (non HMO)

## 2022-05-28 ENCOUNTER — Other Ambulatory Visit: Payer: Self-pay

## 2022-05-28 ENCOUNTER — Emergency Department (HOSPITAL_BASED_OUTPATIENT_CLINIC_OR_DEPARTMENT_OTHER)
Admission: EM | Admit: 2022-05-28 | Discharge: 2022-05-29 | Disposition: A | Discharge status: Home / Self Care | Payer: Managed Care, Other (non HMO) | Attending: Emergency Medicine | Admitting: Emergency Medicine

## 2022-05-28 DIAGNOSIS — W01198A Fall on same level from slipping, tripping and stumbling with subsequent striking against other object, initial encounter: Secondary | ICD-10-CM | POA: Diagnosis not present

## 2022-05-28 DIAGNOSIS — Z7982 Long term (current) use of aspirin: Secondary | ICD-10-CM | POA: Insufficient documentation

## 2022-05-28 DIAGNOSIS — S0101XA Laceration without foreign body of scalp, initial encounter: Secondary | ICD-10-CM | POA: Diagnosis present

## 2022-05-28 DIAGNOSIS — Z7901 Long term (current) use of anticoagulants: Secondary | ICD-10-CM | POA: Insufficient documentation

## 2022-05-28 DIAGNOSIS — W19XXXA Unspecified fall, initial encounter: Secondary | ICD-10-CM

## 2022-05-28 NOTE — ED Notes (Signed)
Pt refusing head ct and labs at this time.

## 2022-05-28 NOTE — ED Triage Notes (Signed)
Pt arrives with c/o fall that happened today. Pt hit his head on a mirror. Pt has a laceration top of his head. Pt does take coumadin.

## 2022-05-29 ENCOUNTER — Emergency Department (HOSPITAL_BASED_OUTPATIENT_CLINIC_OR_DEPARTMENT_OTHER): Payer: Managed Care, Other (non HMO)

## 2022-05-29 LAB — CBC WITH DIFFERENTIAL/PLATELET
Abs Immature Granulocytes: 0.04 10*3/uL (ref 0.00–0.07)
Basophils Absolute: 0.1 10*3/uL (ref 0.0–0.1)
Basophils Relative: 1 %
Eosinophils Absolute: 0.3 10*3/uL (ref 0.0–0.5)
Eosinophils Relative: 3 %
HCT: 39.9 % (ref 39.0–52.0)
Hemoglobin: 12.9 g/dL — ABNORMAL LOW (ref 13.0–17.0)
Immature Granulocytes: 0 %
Lymphocytes Relative: 19 %
Lymphs Abs: 2 10*3/uL (ref 0.7–4.0)
MCH: 27.1 pg (ref 26.0–34.0)
MCHC: 32.3 g/dL (ref 30.0–36.0)
MCV: 83.8 fL (ref 80.0–100.0)
Monocytes Absolute: 0.9 10*3/uL (ref 0.1–1.0)
Monocytes Relative: 8 %
Neutro Abs: 7.5 10*3/uL (ref 1.7–7.7)
Neutrophils Relative %: 69 %
Platelets: 268 10*3/uL (ref 150–400)
RBC: 4.76 MIL/uL (ref 4.22–5.81)
RDW: 13.7 % (ref 11.5–15.5)
WBC: 10.8 10*3/uL — ABNORMAL HIGH (ref 4.0–10.5)
nRBC: 0 % (ref 0.0–0.2)

## 2022-05-29 LAB — BASIC METABOLIC PANEL
Anion gap: 7 (ref 5–15)
BUN: 29 mg/dL — ABNORMAL HIGH (ref 8–23)
CO2: 27 mmol/L (ref 22–32)
Calcium: 8.6 mg/dL — ABNORMAL LOW (ref 8.9–10.3)
Chloride: 100 mmol/L (ref 98–111)
Creatinine, Ser: 1.04 mg/dL (ref 0.61–1.24)
GFR, Estimated: >60 mL/min (ref >60)
Glucose, Bld: 135 mg/dL — ABNORMAL HIGH (ref 70–99)
Potassium: 4.8 mmol/L (ref 3.5–5.1)
Sodium: 134 mmol/L — ABNORMAL LOW (ref 135–145)

## 2022-05-29 LAB — PROTIME-INR
INR: 3.1 — ABNORMAL HIGH (ref 0.8–1.2)
Prothrombin Time: 31.7 seconds — ABNORMAL HIGH (ref 11.4–15.2)

## 2022-05-29 MED ORDER — LIDOCAINE-EPINEPHRINE-TETRACAINE (LET) TOPICAL GEL
3.0000 mL | Freq: Once | TOPICAL | Status: AC
  Administered 2022-05-29: 3 mL via TOPICAL
  Filled 2022-05-29: qty 3

## 2022-05-29 NOTE — ED Notes (Signed)
EDP and Patient had a discussion with this RN and family present. EDP made patent aware of new lab results and reiterated need for CT of head due to elevated INR. EDP made patient aware of risk of leaving against medical advice, up to and including death. Patient expressed understanding and declined CT.

## 2022-05-29 NOTE — ED Notes (Signed)
Pt refusing CT scans

## 2022-05-29 NOTE — ED Notes (Signed)
Pt wound irrigated

## 2022-05-29 NOTE — ED Provider Notes (Signed)
EMERGENCY DEPARTMENT AT [facility] Provider Note   CSN: [id number] Arrival date & time: 05/28/22  1918     History  Chief Complaint  Patient presents with   Michael Delgado is a 62 y.o. male.   Fall This is a new problem. The current episode started 6 to 12 hours ago. The problem occurs constantly. The problem has been resolved. Pertinent negatives include no chest pain, no abdominal pain, no headaches and no shortness of breath. Nothing aggravates the symptoms. Nothing relieves the symptoms. He has tried nothing for the symptoms. The treatment provided no relief.  Patient on coumadin presents with fall striking head on a mirror.  Denies LOC.  No headache no neck pain no vomiting.  Tetanus is up to date.  Has a laceration to the scalp.       Home Medications Prior to Admission medications   Medication Sig Start Date End Date Taking? Authorizing Provider  aspirin EC 81 MG tablet Take 81 mg by mouth daily. Swallow whole.    [provider]  atorvastatin (LIPITOR) 80 MG tablet Take 1 tablet (80 mg total) by mouth daily. 11/23/18   Daune Perch, NP  budesonide-formoterol [facility]) 160-4.5 MCG/ACT inhaler Inhale 2 puffs into the lungs 2 (two) times daily.    [provider]  cefUROXime (CEFTIN) 500 MG tablet Take 1 tablet (500 mg total) by mouth 2 (two) times daily with a meal. 07/02/21   Debbe Odea, MD  doxylamine, Sleep, (UNISOM) 25 MG tablet Take 50 mg by mouth at bedtime as needed.    [provider]  ezetimibe (ZETIA) 10 MG tablet Take 1 tablet (10 mg total) by mouth daily. Please make yearly appt with Dr. Radford Pax for November 2022 for future refills. Thank you 1st attempt 01/19/21   Sueanne Margarita, MD  Homeopathic Products (LEG CRAMP RELIEF PO) Take 1 tablet by mouth daily as needed.    [provider]  levothyroxine (SYNTHROID) 125 MCG tablet Take 125 mcg by mouth every morning. 06/27/21   [provider]   losartan (COZAAR) 100 MG tablet Take 1 tablet (100 mg total) by mouth daily. Pt needs to make appt with provider for further refills - 1st attempt 12/15/20   Sueanne Margarita, MD  Magnesium 125 MG CAPS Take 125 mg by mouth daily.    [provider]  metFORMIN (GLUCOPHAGE) 500 MG tablet Take 500 mg by mouth daily. 06/11/21   [provider]  methocarbamol (ROBAXIN) 500 MG tablet Take 1 tablet (500 mg total) by mouth every 8 (eight) hours as needed for muscle spasms. Patient not taking: Reported on 06/30/2021 12/12/17   Geradine Girt, DO  metoprolol tartrate (LOPRESSOR) 50 MG tablet Take 1 tablet (50 mg total) by mouth 2 (two) times daily. Please make yearly appt with Dr. Radford Pax for November 2022 for future refills. Thank you 1st attempt 01/19/21   Sueanne Margarita, MD  Omega-3 Fatty Acids (FISH OIL PO) Take 3 capsules by mouth at bedtime. 1200/360 MG    [provider]  Potassium Chloride (K+ POTASSIUM PO) Take 1 tablet by mouth daily.    [provider]  PROAIR HFA 108 [phone] Base) MCG/ACT inhaler Inhale 2 puffs into the lungs daily as needed for wheezing or shortness of breath.  12/19/16   [provider]  TRULICITY [id number] SOPN Inject 0.75 mg into the skin once a week. Wednesdays 06/28/21   [provider]  warfarin (COUMADIN) 2.5 MG tablet Take 2.5 mg by mouth. Tuesday, Thursday, saturaday    [provider]  warfarin (COUMADIN) 5 MG tablet Take 1 tablet (5 mg total) by mouth daily. Managed by [facility] Patient taking differently: Take 5 mg by mouth. On Sunday, Monday, Wednesday and Friday. 06/29/18   [name], [place]      Allergies    Patient has no known allergies.    Review of Systems   Review of Systems  Constitutional:  Negative for fever.  Respiratory:  Negative for shortness of breath.   Cardiovascular:  Negative for chest pain.  Gastrointestinal:  Negative for abdominal pain and vomiting.  Musculoskeletal:  Negative  for neck pain.  Skin:  Negative for wound.  Neurological:  Negative for dizziness and headaches.  All other systems reviewed and are negative.   Physical Exam Updated Vital Signs BP (!) 178/89   Pulse 95   Temp 98.5 F (36.9 C) (Oral)   Resp 18   Wt 122.5 kg   SpO2 96%   BMI 39.87 kg/m  Physical Exam Vitals and nursing note reviewed. Exam conducted with a chaperone present.  Constitutional:      General: He is not in acute distress.    Appearance: He is well-developed. He is not diaphoretic.  HENT:     Head: Normocephalic.      Nose: Nose normal.  Eyes:     Conjunctiva/sclera: Conjunctivae normal.     Pupils: Pupils are equal, round, and reactive to light.  Cardiovascular:     Rate and Rhythm: Normal rate and regular rhythm.     Pulses: Normal pulses.     Heart sounds: Normal heart sounds.  Pulmonary:     Effort: Pulmonary effort is normal.     Breath sounds: Normal breath sounds. No wheezing or rales.  Abdominal:     General: Bowel sounds are normal.     Palpations: Abdomen is soft.     Tenderness: There is no abdominal tenderness. There is no guarding or rebound.  Musculoskeletal:        General: Normal range of motion.     Cervical back: Normal range of motion and neck supple. No tenderness.  Skin:    General: Skin is warm and dry.     Capillary Refill: Capillary refill takes less than 2 seconds.  Neurological:     General: No focal deficit present.     Mental Status: He is alert and oriented to person, place, and time.     Deep Tendon Reflexes: Reflexes normal.     Comments: Patient is alert and AO4 and has full decision making capacity and understands all verbal instructions and information   Psychiatric:        Mood and Affect: Mood normal.        Behavior: Behavior normal.     ED Results / Procedures / Treatments   Labs (all labs ordered are listed, but only abnormal results are displayed) Results for orders placed or performed during the hospital  encounter of 05/28/22  CBC with Differential  Result Value Ref Range   WBC 10.8 (H) 4.0 - 10.5 K/uL   RBC 4.76 4.22 - 5.81 MIL/uL   Hemoglobin 12.9 (L) 13.0 - 17.0 g/dL   HCT 39.9 39.0 - 52.0 %   MCV 83.8 80.0 - 100.0 fL   MCH 27.1 26.0 - 34.0 pg   MCHC 32.3 30.0 - 36.0 g/dL   RDW 13.7 11.5 - 15.5 %  Platelets 268 150 - 400 K/uL   nRBC 0.0 0.0 - 0.2 %   Neutrophils Relative % 69 %   Neutro Abs 7.5 1.7 - 7.7 K/uL   Lymphocytes Relative 19 %   Lymphs Abs 2.0 0.7 - 4.0 K/uL   Monocytes Relative 8 %   Monocytes Absolute 0.9 0.1 - 1.0 K/uL   Eosinophils Relative 3 %   Eosinophils Absolute 0.3 0.0 - 0.5 K/uL   Basophils Relative 1 %   Basophils Absolute 0.1 0.0 - 0.1 K/uL   Immature Granulocytes 0 %   Abs Immature Granulocytes 0.04 0.00 - 0.07 K/uL  Basic metabolic panel  Result Value Ref Range   Sodium 134 (L) 135 - 145 mmol/L   Potassium 4.8 3.5 - 5.1 mmol/L   Chloride 100 98 - 111 mmol/L   CO2 27 22 - 32 mmol/L   Glucose, Bld 135 (H) 70 - 99 mg/dL   BUN 29 (H) 8 - 23 mg/dL   Creatinine, Ser 1.04 0.61 - 1.24 mg/dL   Calcium 8.6 (L) 8.9 - 10.3 mg/dL   GFR, Estimated >60 >60 mL/min   Anion gap 7 5 - 15  Protime-INR  Result Value Ref Range   Prothrombin Time 31.7 (H) 11.4 - 15.2 seconds   INR 3.1 (H) 0.8 - 1.2   CT Head Wo Contrast  Result Date: 05/29/2022 CLINICAL DATA:  Fall, laceration to top of head. EXAM: CT HEAD WITHOUT CONTRAST CT CERVICAL SPINE WITHOUT CONTRAST TECHNIQUE: Multidetector CT imaging of the head and cervical spine was performed following the standard protocol without intravenous contrast. Multiplanar CT image reconstructions of the cervical spine were also generated. RADIATION DOSE REDUCTION: This exam was performed according to the departmental dose-optimization program which includes automated exposure control, adjustment of the mA and/or kV according to patient size and/or use of iterative reconstruction technique. COMPARISON:  12/16/2018. FINDINGS: CT  HEAD FINDINGS Brain: No acute intracranial hemorrhage, midline shift or mass effect. No extra-axial fluid collection. Periventricular white matter hypodensities are present bilaterally. No hydrocephalus. Vascular: No hyperdense vessel or unexpected calcification. Skull: Normal. Negative for fracture or focal lesion. Sinuses/Orbits: No acute finding. Other: A small scalp contusion and surgical staples are present over the parietal bone on the left. CT CERVICAL SPINE FINDINGS Alignment: Normal. Skull base and vertebrae: No acute fracture. No primary bone lesion or focal pathologic process. Soft tissues and spinal canal: No prevertebral fluid or swelling. No visible canal hematoma. Disc levels: Mild multilevel degenerative endplate changes and facet arthropathy bilaterally. Upper chest: No acute abnormality. Other: Carotid artery calcifications bilaterally. IMPRESSION: 1. No acute intracranial process. 2. Chronic microvascular ischemic changes. 3. Degenerative changes in the cervical spine without evidence of acute fracture. Electronically Signed   By: Brett Fairy M.D.   On: 05/29/2022 01:42   CT Cervical Spine Wo Contrast  Result Date: 05/29/2022 CLINICAL DATA:  Fall, laceration to top of head. EXAM: CT HEAD WITHOUT CONTRAST CT CERVICAL SPINE WITHOUT CONTRAST TECHNIQUE: Multidetector CT imaging of the head and cervical spine was performed following the standard protocol without intravenous contrast. Multiplanar CT image reconstructions of the cervical spine were also generated. RADIATION DOSE REDUCTION: This exam was performed according to the departmental dose-optimization program which includes automated exposure control, adjustment of the mA and/or kV according to patient size and/or use of iterative reconstruction technique. COMPARISON:  12/16/2018. FINDINGS: CT HEAD FINDINGS Brain: No acute intracranial hemorrhage, midline shift or mass effect. No extra-axial fluid collection. Periventricular white matter  hypodensities are present  bilaterally. No hydrocephalus. Vascular: No hyperdense vessel or unexpected calcification. Skull: Normal. Negative for fracture or focal lesion. Sinuses/Orbits: No acute finding. Other: A small scalp contusion and surgical staples are present over the parietal bone on the left. CT CERVICAL SPINE FINDINGS Alignment: Normal. Skull base and vertebrae: No acute fracture. No primary bone lesion or focal pathologic process. Soft tissues and spinal canal: No prevertebral fluid or swelling. No visible canal hematoma. Disc levels: Mild multilevel degenerative endplate changes and facet arthropathy bilaterally. Upper chest: No acute abnormality. Other: Carotid artery calcifications bilaterally. IMPRESSION: 1. No acute intracranial process. 2. Chronic microvascular ischemic changes. 3. Degenerative changes in the cervical spine without evidence of acute fracture. Electronically Signed   By: Brett Fairy M.D.   On: 05/29/2022 01:42     Radiology No results found.  Procedures .Marland KitchenLaceration Repair  Date/Time: 05/29/2022 1:15 AM  Performed by: Veatrice Kells, MD Authorized by: Veatrice Kells, MD   Consent:    Consent obtained:  Verbal   Consent given by:  Patient   Risks discussed:  Need for additional repair, nerve damage, infection, pain, poor cosmetic result, poor wound healing, tendon damage and vascular damage Universal protocol:    Patient identity confirmed:  Arm band Anesthesia:    Anesthesia method:  Topical application   Topical anesthetic:  LET Laceration details:    Location:  Scalp   Length (cm):  5   Depth (mm):  1 Pre-procedure details:    Preparation:  Patient was prepped and draped in usual sterile fashion Exploration:    Hemostasis achieved with:  Direct pressure   Imaging outcome: foreign body not noted     Wound exploration: wound explored through full range of motion     Wound extent: fascia not violated, no foreign body and no signs of injury      Contaminated: no   Treatment:    Area cleansed with:  Povidone-iodine, chlorhexidine, saline and Shur-Clens   Amount of cleaning:  Extensive   Irrigation solution:  Sterile saline   Irrigation method:  Syringe   Debridement:  None   Undermining:  None   Scar revision: no   Skin repair:    Repair method:  Staples   Number of staples:  10 Approximation:    Approximation:  Close Repair type:    Repair type:  Intermediate Post-procedure details:    Dressing:  Sterile dressing   Procedure completion:  Tolerated well, no immediate complications     Medications Ordered in ED Medications  lidocaine-EPINEPHrine-tetracaine (LET) topical gel (3 mLs Topical Given 05/29/22 0022)    ED Course/ Medical Decision Making/ A&P                             Medical Decision Making Fall on coumadin scalp laceration   Problems Addressed: Fall, initial encounter:    Details: Patient refused CT scan I have had a long discussion with patient and son regarding the imperativeness   Amount and/or Complexity of Data Reviewed External Data Reviewed: notes.    Details: Previous notes reviewed  Labs: ordered.    Details: All labs reviewed:  white count top normal 10.8, hemoglobin slight low 12.9, normal platelet count.  INR elevated and therapeutic 3.1. Sodium slight low 134, normal potassium 4.8 normal creatinine 1.04  Radiology: ordered and independent interpretation performed.    Details: Negative CT head and C spine by me   Risk Risk Details: Patient was refusing ct  scan.  I have explained that the patient would be a trauma level 2 at my other hospitals due to a significant fall on blood thinners with a therapeutic INR.  Patient wanted to [facility] and I went with charge nurse and went through the entire AMA discussion of risks of signing out.  I had hit the [place] button in epic and was informed patient decided to stay for CT.  He was taken immediately to CT.  This is negative and he will need to follow up  at urgent care in 7 days for staple removal.  Strict return     Final Clinical Impression(s) / ED Diagnoses Final diagnoses:  Fall, initial encounter  Laceration of scalp, initial encounter   Return for intractable cough, coughing up blood, fevers > 100.4 unrelieved by medication, shortness of breath, intractable vomiting, chest pain, shortness of breath, weakness, numbness, changes in speech, facial asymmetry, abdominal pain, passing out, Inability to tolerate liquids or food, cough, altered mental status or any concerns. No signs of systemic illness or infection. The patient is nontoxic-appearing on exam and vital signs are within normal limits.  I have reviewed the triage vital signs and the nursing notes. Pertinent labs & imaging results that were available during my care of the patient were reviewed by me and considered in my medical decision making (see chart for details). After history, exam, and medical workup I feel the patient has been appropriately medically screened and is safe for discharge home. Pertinent diagnoses were discussed with the patient. Patient was given return precautions. Rx / DC Orders ED Discharge Orders     None         Jalin Erpelding, MD 05/29/22 [phone]

## 2022-06-04 ENCOUNTER — Ambulatory Visit: Payer: Managed Care, Other (non HMO) | Admitting: Cardiology

## 2022-10-07 ENCOUNTER — Encounter: Payer: Self-pay | Admitting: Cardiology

## 2022-10-07 ENCOUNTER — Ambulatory Visit: Payer: Managed Care, Other (non HMO) | Attending: Cardiology | Admitting: Cardiology

## 2022-10-07 VITALS — BP 140/78 | HR 67 | Ht 69.0 in | Wt 271.6 lb

## 2022-10-07 DIAGNOSIS — I10 Essential (primary) hypertension: Secondary | ICD-10-CM | POA: Diagnosis not present

## 2022-10-07 DIAGNOSIS — I2510 Atherosclerotic heart disease of native coronary artery without angina pectoris: Secondary | ICD-10-CM | POA: Diagnosis not present

## 2022-10-07 DIAGNOSIS — I350 Nonrheumatic aortic (valve) stenosis: Secondary | ICD-10-CM | POA: Diagnosis not present

## 2022-10-07 DIAGNOSIS — E785 Hyperlipidemia, unspecified: Secondary | ICD-10-CM

## 2022-10-07 DIAGNOSIS — Z79899 Other long term (current) drug therapy: Secondary | ICD-10-CM

## 2022-10-07 NOTE — Patient Instructions (Signed)
Medication Instructions:  Your physician recommends that you continue on your current medications as directed. Please refer to the Current Medication list given to you today.  *If you need a refill on your cardiac medications before your next appointment, please call your pharmacy*   Lab Work: Please complete a FASTING lipid panel and an ALT in our lab before you leave today.  If you have labs (blood work) drawn today and your tests are completely normal, you will receive your results only by: MyChart Message (if you have MyChart) OR A paper copy in the mail If you have any lab test that is abnormal or we need to change your treatment, we will call you to review the results.   Testing/Procedures: Your physician has requested that you have an echocardiogram. Echocardiography is a painless test that uses sound waves to create images of your heart. It provides your doctor with information about the size and shape of your heart and how well your heart's chambers and valves are working. This procedure takes approximately one hour. There are no restrictions for this procedure. Please do NOT wear cologne, perfume, aftershave, or lotions (deodorant is allowed). Please arrive 15 minutes prior to your appointment time.    Follow-Up: At [facility], you and your health needs are our priority.  As part of our continuing mission to provide you with exceptional heart care, we have created designated Provider Care Teams.  These Care Teams include your primary Cardiologist (physician) and Advanced Practice Providers (APPs -  Physician Assistants and Nurse Practitioners) who all work together to provide you with the care you need, when you need it.  We recommend signing up for the patient portal called "MyChart".  Sign up information is provided on this After Visit Summary.  MyChart is used to connect with patients for Virtual Visits (Telemedicine).  Patients are able to view lab/test results,  encounter notes, upcoming appointments, etc.  Non-urgent messages can be sent to your provider as well.   To learn more about what you can do with MyChart, go to [email or web address].    Your next appointment:   1 year(s)  Provider:   Armanda Magic, MD

## 2022-10-07 NOTE — Addendum Note (Signed)
Addended by: Luellen Pucker on: 10/07/2022 08:38 AM   Modules accepted: Orders
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
## 2022-10-08 LAB — LIPID PANEL
Chol/HDL Ratio: 3.1 ratio (ref 0.0–5.0)
Cholesterol, Total: 107 mg/dL (ref 100–199)
HDL: 34 mg/dL — ABNORMAL LOW (ref >39)
LDL Chol Calc (NIH): 51 mg/dL (ref 0–99)
Triglycerides: 118 mg/dL (ref 0–149)
VLDL Cholesterol Cal: 22 mg/dL (ref 5–40)

## 2022-10-08 LAB — ALT: ALT: 31 IU/L (ref 0–44)

## 2022-11-04 ENCOUNTER — Ambulatory Visit (HOSPITAL_COMMUNITY): Payer: Managed Care, Other (non HMO) | Attending: Internal Medicine

## 2022-11-04 DIAGNOSIS — I350 Nonrheumatic aortic (valve) stenosis: Secondary | ICD-10-CM | POA: Diagnosis not present

## 2022-11-04 LAB — ECHOCARDIOGRAM COMPLETE
AR max vel: 1.71 cm2
AV Area VTI: 1.96 cm2
AV Area mean vel: 1.83 cm2
AV Mean grad: 16 mmHg
AV Peak grad: 32.5 mmHg
Ao pk vel: 2.85 m/s
Area-P 1/2: 2.99 cm2
S' Lateral: 2.8 cm

## 2022-11-04 MED ORDER — PERFLUTREN LIPID MICROSPHERE
3.0000 mL | INTRAVENOUS | Status: AC | PRN
  Administered 2022-11-04: 3 mL via INTRAVENOUS

## 2023-10-24 ENCOUNTER — Emergency Department (HOSPITAL_BASED_OUTPATIENT_CLINIC_OR_DEPARTMENT_OTHER)

## 2023-10-24 ENCOUNTER — Other Ambulatory Visit: Payer: Self-pay

## 2023-10-24 ENCOUNTER — Encounter (HOSPITAL_BASED_OUTPATIENT_CLINIC_OR_DEPARTMENT_OTHER): Payer: Self-pay

## 2023-10-24 ENCOUNTER — Inpatient Hospital Stay (HOSPITAL_BASED_OUTPATIENT_CLINIC_OR_DEPARTMENT_OTHER)
Admission: EM | Admit: 2023-10-24 | Discharge: 2023-10-26 | DRG: 871 | Disposition: A | Discharge status: Home / Self Care | Source: Ambulatory Visit | Attending: Internal Medicine | Admitting: Internal Medicine

## 2023-10-24 ENCOUNTER — Inpatient Hospital Stay (HOSPITAL_COMMUNITY)

## 2023-10-24 DIAGNOSIS — Z8349 Family history of other endocrine, nutritional and metabolic diseases: Secondary | ICD-10-CM | POA: Diagnosis not present

## 2023-10-24 DIAGNOSIS — J9601 Acute respiratory failure with hypoxia: Secondary | ICD-10-CM | POA: Diagnosis present

## 2023-10-24 DIAGNOSIS — Z825 Family history of asthma and other chronic lower respiratory diseases: Secondary | ICD-10-CM

## 2023-10-24 DIAGNOSIS — E039 Hypothyroidism, unspecified: Secondary | ICD-10-CM | POA: Diagnosis present

## 2023-10-24 DIAGNOSIS — Z85528 Personal history of other malignant neoplasm of kidney: Secondary | ICD-10-CM

## 2023-10-24 DIAGNOSIS — M8448XA Pathological fracture, other site, initial encounter for fracture: Secondary | ICD-10-CM | POA: Diagnosis present

## 2023-10-24 DIAGNOSIS — I480 Paroxysmal atrial fibrillation: Secondary | ICD-10-CM | POA: Diagnosis present

## 2023-10-24 DIAGNOSIS — Z8249 Family history of ischemic heart disease and other diseases of the circulatory system: Secondary | ICD-10-CM

## 2023-10-24 DIAGNOSIS — Z923 Personal history of irradiation: Secondary | ICD-10-CM

## 2023-10-24 DIAGNOSIS — Z7901 Long term (current) use of anticoagulants: Secondary | ICD-10-CM | POA: Diagnosis not present

## 2023-10-24 DIAGNOSIS — J441 Chronic obstructive pulmonary disease with (acute) exacerbation: Secondary | ICD-10-CM | POA: Diagnosis present

## 2023-10-24 DIAGNOSIS — K920 Hematemesis: Secondary | ICD-10-CM | POA: Diagnosis present

## 2023-10-24 DIAGNOSIS — Z8041 Family history of malignant neoplasm of ovary: Secondary | ICD-10-CM

## 2023-10-24 DIAGNOSIS — E1165 Type 2 diabetes mellitus with hyperglycemia: Secondary | ICD-10-CM | POA: Diagnosis present

## 2023-10-24 DIAGNOSIS — Z905 Acquired absence of kidney: Secondary | ICD-10-CM

## 2023-10-24 DIAGNOSIS — E785 Hyperlipidemia, unspecified: Secondary | ICD-10-CM | POA: Diagnosis present

## 2023-10-24 DIAGNOSIS — Z79890 Hormone replacement therapy (postmenopausal): Secondary | ICD-10-CM

## 2023-10-24 DIAGNOSIS — R55 Syncope and collapse: Secondary | ICD-10-CM | POA: Diagnosis not present

## 2023-10-24 DIAGNOSIS — E66812 Obesity, class 2: Secondary | ICD-10-CM | POA: Diagnosis present

## 2023-10-24 DIAGNOSIS — D6862 Lupus anticoagulant syndrome: Secondary | ICD-10-CM | POA: Diagnosis present

## 2023-10-24 DIAGNOSIS — Z952 Presence of prosthetic heart valve: Secondary | ICD-10-CM

## 2023-10-24 DIAGNOSIS — K5900 Constipation, unspecified: Secondary | ICD-10-CM | POA: Diagnosis not present

## 2023-10-24 DIAGNOSIS — Z91148 Patient's other noncompliance with medication regimen for other reason: Secondary | ICD-10-CM

## 2023-10-24 DIAGNOSIS — E782 Mixed hyperlipidemia: Secondary | ICD-10-CM | POA: Insufficient documentation

## 2023-10-24 DIAGNOSIS — J189 Pneumonia, unspecified organism: Secondary | ICD-10-CM | POA: Diagnosis present

## 2023-10-24 DIAGNOSIS — Z7984 Long term (current) use of oral hypoglycemic drugs: Secondary | ICD-10-CM

## 2023-10-24 DIAGNOSIS — J440 Chronic obstructive pulmonary disease with acute lower respiratory infection: Secondary | ICD-10-CM | POA: Diagnosis present

## 2023-10-24 DIAGNOSIS — J449 Chronic obstructive pulmonary disease, unspecified: Secondary | ICD-10-CM | POA: Diagnosis present

## 2023-10-24 DIAGNOSIS — A419 Sepsis, unspecified organism: Secondary | ICD-10-CM | POA: Diagnosis present

## 2023-10-24 DIAGNOSIS — Z23 Encounter for immunization: Secondary | ICD-10-CM | POA: Diagnosis not present

## 2023-10-24 DIAGNOSIS — S22059A Unspecified fracture of T5-T6 vertebra, initial encounter for closed fracture: Secondary | ICD-10-CM | POA: Diagnosis present

## 2023-10-24 DIAGNOSIS — Z6835 Body mass index (BMI) 35.0-35.9, adult: Secondary | ICD-10-CM | POA: Diagnosis not present

## 2023-10-24 DIAGNOSIS — I2510 Atherosclerotic heart disease of native coronary artery without angina pectoris: Secondary | ICD-10-CM | POA: Diagnosis present

## 2023-10-24 DIAGNOSIS — Z8701 Personal history of pneumonia (recurrent): Secondary | ICD-10-CM

## 2023-10-24 DIAGNOSIS — Z954 Presence of other heart-valve replacement: Secondary | ICD-10-CM

## 2023-10-24 DIAGNOSIS — Z79899 Other long term (current) drug therapy: Secondary | ICD-10-CM

## 2023-10-24 DIAGNOSIS — Z7982 Long term (current) use of aspirin: Secondary | ICD-10-CM

## 2023-10-24 DIAGNOSIS — Z8261 Family history of arthritis: Secondary | ICD-10-CM

## 2023-10-24 DIAGNOSIS — Z833 Family history of diabetes mellitus: Secondary | ICD-10-CM

## 2023-10-24 DIAGNOSIS — I10 Essential (primary) hypertension: Secondary | ICD-10-CM | POA: Diagnosis present

## 2023-10-24 DIAGNOSIS — Z87891 Personal history of nicotine dependence: Secondary | ICD-10-CM

## 2023-10-24 DIAGNOSIS — R1310 Dysphagia, unspecified: Secondary | ICD-10-CM | POA: Diagnosis present

## 2023-10-24 DIAGNOSIS — Z7951 Long term (current) use of inhaled steroids: Secondary | ICD-10-CM

## 2023-10-24 DIAGNOSIS — Z7985 Long-term (current) use of injectable non-insulin antidiabetic drugs: Secondary | ICD-10-CM

## 2023-10-24 DIAGNOSIS — Z8571 Personal history of Hodgkin lymphoma: Secondary | ICD-10-CM

## 2023-10-24 DIAGNOSIS — Z951 Presence of aortocoronary bypass graft: Secondary | ICD-10-CM

## 2023-10-24 DIAGNOSIS — R0602 Shortness of breath: Secondary | ICD-10-CM | POA: Diagnosis present

## 2023-10-24 LAB — COMPREHENSIVE METABOLIC PANEL WITH GFR
ALT: 23 U/L (ref 0–44)
AST: 29 U/L (ref 15–41)
Albumin: 3.8 g/dL (ref 3.5–5.0)
Alkaline Phosphatase: 85 U/L (ref 38–126)
Anion gap: 10 (ref 5–15)
BUN: 34 mg/dL — ABNORMAL HIGH (ref 8–23)
CO2: 23 mmol/L (ref 22–32)
Calcium: 8.3 mg/dL — ABNORMAL LOW (ref 8.9–10.3)
Chloride: 105 mmol/L (ref 98–111)
Creatinine, Ser: 1.09 mg/dL (ref 0.61–1.24)
GFR, Estimated: >60 mL/min (ref >60)
Glucose, Bld: 100 mg/dL — ABNORMAL HIGH (ref 70–99)
Potassium: 4.8 mmol/L (ref 3.5–5.1)
Sodium: 138 mmol/L (ref 135–145)
Total Bilirubin: 0.5 mg/dL (ref 0.0–1.2)
Total Protein: 6.6 g/dL (ref 6.5–8.1)

## 2023-10-24 LAB — URINALYSIS, W/ REFLEX TO CULTURE (INFECTION SUSPECTED)
Bilirubin Urine: NEGATIVE
Glucose, UA: NEGATIVE mg/dL
Hgb urine dipstick: NEGATIVE
Ketones, ur: NEGATIVE mg/dL
Leukocytes,Ua: NEGATIVE
Nitrite: NEGATIVE
Protein, ur: NEGATIVE mg/dL
Specific Gravity, Urine: 1.01 (ref 1.005–1.030)
pH: 6.5 (ref 5.0–8.0)

## 2023-10-24 LAB — CBC WITH DIFFERENTIAL/PLATELET
Abs Immature Granulocytes: 0.06 K/uL (ref 0.00–0.07)
Basophils Absolute: 0 K/uL (ref 0.0–0.1)
Basophils Relative: 0 %
Eosinophils Absolute: 0.2 K/uL (ref 0.0–0.5)
Eosinophils Relative: 1 %
HCT: 30.1 % — ABNORMAL LOW (ref 39.0–52.0)
Hemoglobin: 9.6 g/dL — ABNORMAL LOW (ref 13.0–17.0)
Immature Granulocytes: 1 %
Lymphocytes Relative: 3 %
Lymphs Abs: 0.4 K/uL — ABNORMAL LOW (ref 0.7–4.0)
MCH: 26 pg (ref 26.0–34.0)
MCHC: 31.9 g/dL (ref 30.0–36.0)
MCV: 81.6 fL (ref 80.0–100.0)
Monocytes Absolute: 0.8 K/uL (ref 0.1–1.0)
Monocytes Relative: 6 %
Neutro Abs: 11.1 K/uL — ABNORMAL HIGH (ref 1.7–7.7)
Neutrophils Relative %: 89 %
Platelets: 239 K/uL (ref 150–400)
RBC: 3.69 MIL/uL — ABNORMAL LOW (ref 4.22–5.81)
RDW: 16.6 % — ABNORMAL HIGH (ref 11.5–15.5)
WBC: 12.6 K/uL — ABNORMAL HIGH (ref 4.0–10.5)
nRBC: 0 % (ref 0.0–0.2)

## 2023-10-24 LAB — LACTIC ACID, PLASMA: Lactic Acid, Venous: 1.4 mmol/L (ref 0.5–1.9)

## 2023-10-24 LAB — PHOSPHORUS: Phosphorus: 2.9 mg/dL (ref 2.5–4.6)

## 2023-10-24 LAB — STREP PNEUMONIAE URINARY ANTIGEN: Strep Pneumo Urinary Antigen: NEGATIVE

## 2023-10-24 LAB — EXPECTORATED SPUTUM ASSESSMENT W GRAM STAIN, RFLX TO RESP C

## 2023-10-24 LAB — MAGNESIUM: Magnesium: 1.9 mg/dL (ref 1.7–2.4)

## 2023-10-24 LAB — TROPONIN T, HIGH SENSITIVITY: Troponin T High Sensitivity: 21 ng/L — ABNORMAL HIGH (ref <19)

## 2023-10-24 LAB — PROTIME-INR
INR: 2.8 — ABNORMAL HIGH (ref 0.8–1.2)
Prothrombin Time: 31.1 s — ABNORMAL HIGH (ref 11.4–15.2)

## 2023-10-24 MED ORDER — IPRATROPIUM-ALBUTEROL 0.5-2.5 (3) MG/3ML IN SOLN: 3.0000 mL | RESPIRATORY_TRACT | Status: DC | PRN

## 2023-10-24 MED ORDER — IPRATROPIUM-ALBUTEROL 0.5-2.5 (3) MG/3ML IN SOLN
3.0000 mL | Freq: Once | RESPIRATORY_TRACT | Status: AC
  Administered 2023-10-24: 3 mL via RESPIRATORY_TRACT
  Filled 2023-10-24: qty 3

## 2023-10-24 MED ORDER — ACETAMINOPHEN 650 MG RE SUPP: 650.0000 mg | Freq: Four times a day (QID) | RECTAL | Status: DC | PRN

## 2023-10-24 MED ORDER — ONDANSETRON HCL 4 MG PO TABS: 4.0000 mg | ORAL_TABLET | Freq: Four times a day (QID) | ORAL | Status: DC | PRN

## 2023-10-24 MED ORDER — SODIUM CHLORIDE 0.9 % IV SOLN
1.0000 g | Freq: Once | INTRAVENOUS | Status: AC
  Administered 2023-10-24: 1 g via INTRAVENOUS
  Filled 2023-10-24: qty 10

## 2023-10-24 MED ORDER — IPRATROPIUM-ALBUTEROL 0.5-2.5 (3) MG/3ML IN SOLN: 3.0000 mL | Freq: Four times a day (QID) | RESPIRATORY_TRACT | Status: DC

## 2023-10-24 MED ORDER — METHYLPREDNISOLONE SODIUM SUCC 40 MG IJ SOLR
40.0000 mg | Freq: Once | INTRAMUSCULAR | Status: AC
  Administered 2023-10-24: 40 mg via INTRAVENOUS
  Filled 2023-10-24: qty 1

## 2023-10-24 MED ORDER — LACTATED RINGERS IV BOLUS
1000.0000 mL | Freq: Once | INTRAVENOUS | Status: AC
  Administered 2023-10-24: 1000 mL via INTRAVENOUS

## 2023-10-24 MED ORDER — IOHEXOL 350 MG/ML SOLN
100.0000 mL | Freq: Once | INTRAVENOUS | Status: AC | PRN
  Administered 2023-10-24: 75 mL via INTRAVENOUS

## 2023-10-24 MED ORDER — SODIUM CHLORIDE 0.9 % IV SOLN
500.0000 mg | Freq: Once | INTRAVENOUS | Status: AC
  Administered 2023-10-24: 500 mg via INTRAVENOUS
  Filled 2023-10-24: qty 5

## 2023-10-24 MED ORDER — WARFARIN - PHARMACIST DOSING INPATIENT: Freq: Every day | Status: DC

## 2023-10-24 MED ORDER — WARFARIN SODIUM 5 MG PO TABS
5.0000 mg | ORAL_TABLET | Freq: Once | ORAL | Status: AC
  Administered 2023-10-24: 5 mg via ORAL
  Filled 2023-10-24: qty 1

## 2023-10-24 MED ORDER — IPRATROPIUM-ALBUTEROL 0.5-2.5 (3) MG/3ML IN SOLN
3.0000 mL | Freq: Four times a day (QID) | RESPIRATORY_TRACT | Status: DC
  Administered 2023-10-25 – 2023-10-26 (×2): 3 mL via RESPIRATORY_TRACT
  Filled 2023-10-24 (×2): qty 3

## 2023-10-24 MED ORDER — ACETAMINOPHEN 325 MG PO TABS
650.0000 mg | ORAL_TABLET | Freq: Four times a day (QID) | ORAL | Status: DC | PRN
  Administered 2023-10-24 – 2023-10-26 (×2): 650 mg via ORAL
  Filled 2023-10-24 (×2): qty 2

## 2023-10-24 MED ORDER — PNEUMOCOCCAL 20-VAL CONJ VACC 0.5 ML IM SUSY
0.5000 mL | PREFILLED_SYRINGE | INTRAMUSCULAR | Status: AC
  Administered 2023-10-26: 0.5 mL via INTRAMUSCULAR
  Filled 2023-10-24: qty 0.5

## 2023-10-24 MED ORDER — SODIUM CHLORIDE 0.9 % IV SOLN
2.0000 g | INTRAVENOUS | Status: DC
  Administered 2023-10-25: 2 g via INTRAVENOUS
  Filled 2023-10-24: qty 20

## 2023-10-24 MED ORDER — SODIUM CHLORIDE 0.9 % IV SOLN
500.0000 mg | INTRAVENOUS | Status: DC
  Administered 2023-10-25 – 2023-10-26 (×2): 500 mg via INTRAVENOUS
  Filled 2023-10-24 (×2): qty 5

## 2023-10-24 MED ORDER — SODIUM CHLORIDE 0.9 % IV BOLUS
1000.0000 mL | Freq: Once | INTRAVENOUS | Status: AC
  Administered 2023-10-24: 1000 mL via INTRAVENOUS

## 2023-10-24 MED ORDER — ONDANSETRON HCL 4 MG/2ML IJ SOLN: 4.0000 mg | Freq: Four times a day (QID) | INTRAMUSCULAR | Status: DC | PRN

## 2023-10-24 NOTE — ED Triage Notes (Addendum)
 Pt presents with wife who helps provide to the HPI. Pt has been acting abnormally for the last 2 days then this AM pt started having a fever, cough, and ShOB. He does report hemoptysis. Last had APAP at 09:00 this AM. Pt with hx of COPD and does not take his maintenance inhaler.

## 2023-10-24 NOTE — ED Notes (Signed)
 Patient reporting R hand numbness/tingling. Reports it started since he has been here (arrival 1000) and has been present since before he went to CT. Unable to say exactly what time numbness began. Will notify MD.

## 2023-10-24 NOTE — ED Provider Notes (Signed)
 [name] Provider Note   CSN: [id number] Arrival date & time: 10/24/23  1000     Patient presents with: Shortness of Breath   Michael Delgado is a 63 y.o. male.   This is a 63 year old male presenting emergency department for shortness of breath.  Has had some chest congestion for the past month and a half or so.  Has had worsening shortness of breath for the past several days.  History of COPD noncompliant with his Symbicort and albuterol .  He is not on oxygen.  Started having what appeared to be blood in sputum with cough today.  Reports nickel to dime sized.  No chest pain or shortness of breath.  Did have a fever at home, took Tylenol  at 9 AM.  Denies worsening lower extremity edema.  Does have lupus on warfarin, had INR checked last week was in therapeutic range.  Feeling mildly improved with nasal cannula`   Shortness of Breath      Prior to Admission medications   Medication Sig Start Date End Date Taking? Authorizing Provider  aspirin  EC 81 MG tablet Take 81 mg by mouth daily. Swallow whole.    [provider]  atorvastatin  (LIPITOR ) 80 MG tablet Take 1 tablet (80 mg total) by mouth daily. 11/23/18   Hammond, Janine, NP  budesonide-formoterol (SYMBICORT) 160-4.5 MCG/ACT inhaler Inhale 2 puffs into the lungs 2 (two) times daily.    [provider]  cefUROXime  (CEFTIN ) 500 MG tablet Take 1 tablet (500 mg total) by mouth 2 (two) times daily with a meal. Patient not taking: Reported on 10/07/2022 07/02/21   Rizwan, Saima, MD  doxylamine, Sleep, (UNISOM) 25 MG tablet Take 50 mg by mouth at bedtime as needed.    [provider]  ezetimibe  (ZETIA ) 10 MG tablet Take 1 tablet (10 mg total) by mouth daily. Please make yearly appt with Dr. Shlomo for November 2022 for future refills. Thank you 1st attempt 01/19/21   Shlomo Wilbert SAUNDERS, MD  Homeopathic Products (LEG CRAMP RELIEF PO) Take 1 tablet by mouth daily as needed.     [provider]  levothyroxine  (SYNTHROID ) 125 MCG tablet Take 125 mcg by mouth every morning. 06/27/21   [provider]  losartan  (COZAAR ) 100 MG tablet Take 1 tablet (100 mg total) by mouth daily. Pt needs to make appt with provider for further refills - 1st attempt 12/15/20   Shlomo Wilbert SAUNDERS, MD  Magnesium  125 MG CAPS Take 125 mg by mouth daily.    [provider]  metFORMIN (GLUCOPHAGE) 500 MG tablet Take 500 mg by mouth daily. 06/11/21   [provider]  methocarbamol  (ROBAXIN ) 500 MG tablet Take 1 tablet (500 mg total) by mouth every 8 (eight) hours as needed for muscle spasms. 12/12/17   Vann, Jessica U, DO  metoprolol  tartrate (LOPRESSOR ) 50 MG tablet Take 1 tablet (50 mg total) by mouth 2 (two) times daily. Please make yearly appt with Dr. Shlomo for November 2022 for future refills. Thank you 1st attempt 01/19/21   Shlomo Wilbert SAUNDERS, MD  Omega-3 Fatty Acids (FISH OIL PO) Take 3 capsules by mouth at bedtime. 1200/360 MG    [provider]  Potassium Chloride  (K+ POTASSIUM PO) Take 1 tablet by mouth daily.    [provider]  PROAIR  HFA 108 (90 Base) MCG/ACT inhaler Inhale 2 puffs into the lungs daily as needed for wheezing or shortness of breath.  12/19/16   [provider]  TRULICITY 0.75 MG/0.5ML SOPN Inject 0.75 mg into the skin once a week. Wednesdays 06/28/21   [provider]  warfarin (COUMADIN ) 5 MG tablet Take 1 tablet (5 mg total) by mouth daily. Managed by [facility] Patient taking differently: Take 5 mg by mouth. On Sunday, Monday, Wednesday and Friday. 06/29/18   Meng, Hao, PA    Allergies: Patient has no known allergies.    Review of Systems  Respiratory:  Positive for shortness of breath.     Updated Vital Signs BP 113/69 (BP Location: Left Arm)   Pulse 88   Temp 98.5 F (36.9 C) (Oral)   Resp 16   Ht 5' 9 (1.753 m)   Wt 110.2 kg   SpO2 96%   BMI 35.88 kg/m   Physical Exam Vitals and  nursing note reviewed.  Constitutional:      General: He is not in acute distress.    Appearance: He is not toxic-appearing.  HENT:     Head: Normocephalic.  Cardiovascular:     Rate and Rhythm: Normal rate and regular rhythm.  Pulmonary:     Effort: Pulmonary effort is normal.     Breath sounds: Rhonchi present.  Chest:     Chest wall: No tenderness.  Musculoskeletal:     Cervical back: Normal range of motion.     Right lower leg: Edema present.     Left lower leg: Edema present.  Skin:    General: Skin is warm.     Capillary Refill: Capillary refill takes less than 2 seconds.  Neurological:     Mental Status: He is alert and oriented to person, place, and time.  Psychiatric:        Mood and Affect: Mood normal.        Behavior: Behavior normal.     (all labs ordered are listed, but only abnormal results are displayed) Labs Reviewed  COMPREHENSIVE METABOLIC PANEL WITH GFR - Abnormal; Notable for the following components:      Result Value   Glucose, Bld 100 (*)    BUN 34 (*)    Calcium  8.3 (*)    All other components within normal limits  CBC WITH DIFFERENTIAL/PLATELET - Abnormal; Notable for the following components:   WBC 12.6 (*)    RBC 3.69 (*)    Hemoglobin 9.6 (*)    HCT 30.1 (*)    RDW 16.6 (*)    Neutro Abs 11.1 (*)    Lymphs Abs 0.4 (*)    All other components within normal limits  PROTIME-INR - Abnormal; Notable for the following components:   Prothrombin Time 31.1 (*)    INR 2.8 (*)    All other components within normal limits  TROPONIN T, HIGH SENSITIVITY - Abnormal; Notable for the following components:   Troponin T High Sensitivity 21 (*)    All other components within normal limits  CULTURE, BLOOD (ROUTINE X 2)  CULTURE, BLOOD (ROUTINE X 2)  EXPECTORATED SPUTUM ASSESSMENT W GRAM STAIN, RFLX TO RESP C  LACTIC ACID, PLASMA  URINALYSIS, W/ REFLEX TO CULTURE (INFECTION SUSPECTED)  STREP PNEUMONIAE URINARY ANTIGEN  TROPONIN T, HIGH SENSITIVITY     EKG: EKG Interpretation Date/Time:  Friday October 24 2023 10:19:03 EDT Ventricular Rate:  93 PR Interval:  235 QRS Duration:  145 QT Interval:  370 QTC Calculation: 461 R Axis:   205  Text Interpretation: Sinus rhythm Prolonged PR interval RBBB and LPFB Inferior infarct, old Lateral leads are also involved Confirmed  by Neysa Clap [phone]) on 10/24/2023 10:40:10 AM  Radiology: CT Angio Chest PE W and/or Wo Contrast Result Date: 10/24/2023 CLINICAL DATA:  Pulmonary embolism suspected, high probability EXAM: CT ANGIOGRAPHY CHEST WITH CONTRAST TECHNIQUE: Multidetector CT imaging of the chest was performed using the standard protocol during bolus administration of intravenous contrast. Multiplanar CT image reconstructions and MIPs were obtained to evaluate the vascular anatomy. RADIATION DOSE REDUCTION: This exam was performed according to the departmental dose-optimization program which includes automated exposure control, adjustment of the mA and/or kV according to patient size and/or use of iterative reconstruction technique. CONTRAST:  75mL OMNIPAQUE  IOHEXOL  350 MG/ML SOLN COMPARISON:  Chest x-ray October 24, 2023, chest CT December 11, 2017 FINDINGS: Exam is suboptimal for evaluation of PE given the inadequate opacification of the pulmonary artery. Cardiovascular: Mild cardiomegaly. Atherosclerotic calcifications of aorta and coronary arteries. No suspicious finding to suggest pulmonary embolism. Artificial aortic valve. Status post median sternotomy and postsurgical changes in anterior mediastinum. Mediastinum/Nodes: Subcentimeter mediastinal lymph nodes in right lower paratracheal, bilateral hilar and subcarinal nodal stations. Lungs/Pleura: Multifocal bronchocentric solid and ground-glass conglomerate nodules throughout entire perihilar right lung suggestive of multifocal infectious/inflammatory process (multifocal pneumonia). Small right pleural effusion. There is severe bronchial and bronchiolar  wall thickening on the right. Upper Abdomen: Hepatomegaly. Gallbladder wall thickening versus/dense intraluminal sludge. Left kidney nonobstructing nephrolithiasis. Hiatal hernia. Musculoskeletal: Sclerotic changes and possible compression fracture at the level of T6 vertebral body site of known hemangioma, new to prior CT from 2019. Review of the MIP images confirms the above findings. IMPRESSION: Multifocal infectious/inflammatory process of right lung (pneumonia) with associated small right parapneumonic effusion. Recommend follow-up to ensure resolution. No suspicious findings to suggest pulmonary embolism. New sclerotic changes of T6 vertebral body and compression fracture at the site of known osseous hemangioma. Recommend MRI thoracic spine for further assessment. Aortic Atherosclerosis (ICD10-I70.0). Electronically Signed   By: Megan  Zare M.D.   On: 10/24/2023 12:48   DG Chest Portable 1 View Result Date: 10/24/2023 CLINICAL DATA:  Dyspnea, fever, cough EXAM: PORTABLE CHEST - 1 VIEW COMPARISON:  January 25, 2023 FINDINGS: Perihilar airspace disease throughout the right lung. No pleural effusion or pneumothorax. No cardiomegaly. Tortuous aorta with aortic atherosclerosis. Sternotomy wires. No acute fracture or destructive lesions. Multilevel thoracic osteophytosis. IMPRESSION: Findings most consistent with multifocal pneumonia in the right lung. No parapneumonic effusion. Electronically Signed   By: Rogelia Myers M.D.   On: 10/24/2023 11:36     .Critical Care  Performed by: Neysa Clap PARAS, DO Authorized by: Neysa Clap PARAS, DO   Critical care provider statement:    Critical care time (minutes):  30   Critical care was necessary to treat or prevent imminent or life-threatening deterioration of the following conditions:  Sepsis and respiratory failure   Critical care was time spent personally by me on the following activities:  Development of treatment plan with patient or surrogate,  discussions with consultants, evaluation of patient's response to treatment, examination of patient, ordering and review of laboratory studies, ordering and review of radiographic studies, ordering and performing treatments and interventions, pulse oximetry, re-evaluation of patient's condition and review of old charts    Medications Ordered in the ED  ipratropium-albuterol  (DUONEB) 0.5-2.5 (3) MG/3ML nebulizer solution 3 mL (has no administration in time range)  cefTRIAXone  (ROCEPHIN ) 2 g in sodium chloride  0.9 % 100 mL IVPB (has no administration in time range)  cefTRIAXone  (ROCEPHIN ) 1 g in sodium chloride  0.9 % 100 mL IVPB (1 g Intravenous  New Bag/Given 10/24/23 1500)  azithromycin  (ZITHROMAX ) 500 mg in sodium chloride  0.9 % 250 mL IVPB (has no administration in time range)  acetaminophen  (TYLENOL ) tablet 650 mg (has no administration in time range)    Or  acetaminophen  (TYLENOL ) suppository 650 mg (has no administration in time range)  ondansetron  (ZOFRAN ) tablet 4 mg (has no administration in time range)    Or  ondansetron  (ZOFRAN ) injection 4 mg (has no administration in time range)  ipratropium-albuterol  (DUONEB) 0.5-2.5 (3) MG/3ML nebulizer solution 3 mL (3 mLs Nebulization Given 10/24/23 1034)  sodium chloride  0.9 % bolus 1,000 mL (0 mLs Intravenous Stopped 10/24/23 1133)  cefTRIAXone  (ROCEPHIN ) 1 g in sodium chloride  0.9 % 100 mL IVPB (0 g Intravenous Stopped 10/24/23 1136)  azithromycin  (ZITHROMAX ) 500 mg in sodium chloride  0.9 % 250 mL IVPB (0 mg Intravenous Stopped 10/24/23 1238)  iohexol  (OMNIPAQUE ) 350 MG/ML injection 100 mL (75 mLs Intravenous Contrast Given 10/24/23 1137)  lactated ringers  bolus 1,000 mL (0 mLs Intravenous Stopped 10/24/23 1300)    Clinical Course as of 10/24/23 1511  Fri Oct 24, 2023  1114 DG Chest Portable 1 View Appears to have right-sided infiltrates [TY]  1137 WBC(!): 12.6 [TY]  1137 Comprehensive metabolic panel(!) No significant metabolic derangements.  [TY]  1137 Troponin T High Sensitivity(!): 21 Minor elevation; likely demand. [TY]  1145 DG Chest Portable 1 View IMPRESSION: Findings most consistent with multifocal pneumonia in the right lung. No parapneumonic effusion.   Electronically Signed   By: Rogelia Myers M.D.   On: 10/24/2023 11:36   [TY]  1221 Case discussed with Dr. Celinda, hospitalist he will put admission orders [TY]  1510 CT Angio Chest PE W and/or Wo Contrast Multifocal infectious/inflammatory process of right lung (pneumonia) with associated small right parapneumonic effusion. Recommend follow-up to ensure resolution.  No suspicious findings to suggest pulmonary embolism.  New sclerotic changes of T6 vertebral body and compression fracture at the site of known osseous hemangioma. Recommend MRI thoracic spine for further assessment.  Aortic Atherosclerosis (ICD10-I70.0).   [TY]    Clinical Course User Index [TY] Neysa Caron PARAS, DO                                 Medical Decision Making This is a 63 year old male presenting emergency department for fevers, cough with reported hemoptysis.  He is borderline febrile, but did take Tylenol  prior to arrival.  Mildly elevated heart rate, initial blood pressure soft at 98/52, he is tachypneic as well, with oxygen saturation 88% on room air, placed on 2 L with improvement to the mid 90s.  He does not use oxygen at home, and patient's wife reports noncompliance with his inhalers.  Sepsis screening labs ordered, chest x-ray.  Given history of lupus with prior clots will get CTA  chest to evaluate for breakthrough clot versus pneumonia.  Anticipate admission given patient's new oxygen required  Amount and/or Complexity of Data Reviewed Independent Historian: spouse    Details: See above; medication noncompliance with inhaler.   External Data Reviewed:     Details: Last echo with normal EF Labs: ordered. Decision-making details documented in ED Course. Radiology:  ordered and independent interpretation performed. Decision-making details documented in ED Course. ECG/medicine tests: independent interpretation performed.    Details: EKG appears to be normal sinus rhythm at a rate of 93 bpm.  Slightly prolonged PR interval, also appears to have a right bundle branch type  pattern.  No ST segment changes to indicate ischemia.  No QTc prolongation  Risk Prescription drug management. Decision regarding hospitalization. Diagnosis or treatment significantly limited by social determinants of health. Risk Details: Poor health literacy; medication noncompliance.      Final diagnoses:  Community acquired pneumonia of right lung, unspecified part of lung    ED Discharge Orders     None          Neysa Caron PARAS, DO 10/24/23 1511

## 2023-10-24 NOTE — ED Notes (Signed)
 Pt transported to CT via stretcher.

## 2023-10-24 NOTE — ED Notes (Signed)
 Report given to [facility].

## 2023-10-24 NOTE — ED Notes (Signed)
   10/24/23 1254  Respiratory Severity Assessment  $ Protocol Assessment  Yes  Heart Rate 1  Breath Sounds 3  Respiratory Pattern 2  Cough 1  Chest X Ray 2  O2 Requirements/ Pulse Ox Sat(%) 1  Mental Status 0  Dyspnea 1  Score Total 11  Aerosolized Bronchodilators  Aerosolized bronchodilator indications Aerosolized bronchodilator indicated;COPD exacerbation/maintenance therapy  Medication Plan of Care Hand held neb treatment  Bronchial Hygiene  Bronchial Hygiene Plan of Care Cough & Deep breath;Acapella device  Oxygen Therapy  Oxygen therapy indications Oxygen therapy indicated;Hypoxemia;Hypoxia;SOB/Increased WOB;Increased myocardial O2 demand;SpO2 less than 92% or MD goal  Oxygen Plan of care Nasal cannula  Respiratory Therapy Follow Up Assessment  Assessment follow up date 10/25/23   Severity score 11, ordering duonebs at [facility] with flutter valve.

## 2023-10-24 NOTE — ED Notes (Signed)
 [facility] here to transport patient to [facility].

## 2023-10-24 NOTE — Progress Notes (Signed)
 Plan of Care Note for accepted transfer   Patient: Michael Delgado MRN: [id number]   DOA: 10/24/2023  Facility requesting transfer: [facility].  Requesting Provider: Caron Salt, DO. Reason for transfer: Sepsis due to PNA. Facility course:  63 year old male with a past medical history of SLE,  hyperlipidemia, hypertension, type 2 diabetes, class II obesity, CAD/CABG, aortic valve replacement, paroxysmal atrial fibrillation on warfarin, history of pneumonia who presented to the emergency department with fever, cough, hemoptysis and aggressively worse dyspnea.  Symptoms started about 2 days ago.  He desaturated to 88% and RT placed him on nasal cannula oxygen at 2 L/min.  He received ceftriaxone  and azithromycin  along with 2 L of IV fluids.  Plan of care: The patient is accepted for admission to Progressive unit, at [facility].  Author: Alm Dorn Castor, MD 10/24/2023  Check [email or web address] for on-call coverage.  Nursing staff, Please call [facility] Admits & Consults System-Wide number on Amion as soon as patient's arrival, so appropriate admitting provider can evaluate the pt.

## 2023-10-24 NOTE — ED Notes (Signed)
 Dr. Neysa reassessed patient at this time. Pt now reporting complete resolution of RH numbness. Full sensation intact at this time.

## 2023-10-24 NOTE — ED Notes (Signed)
 MD notified of BP trending down, SBP 85 currently. Awaiting orders.

## 2023-10-24 NOTE — Progress Notes (Addendum)
 PHARMACY - ANTICOAGULATION CONSULT NOTE  Pharmacy Consult for warfarin Indication: Afib, mech AVR, lupus anticoagulant  Allergies  Allergen Reactions   Doxycycline  Other (See Comments) and Hypertension    Headache and increases b/p    Patient Measurements: Height: 5' 9 (175.3 cm) Weight: 110.2 kg (243 lb) IBW/kg (Calculated) : 70.7 HEPARIN  DW (KG): 94.9  Vital Signs: Temp: 98.5 F (36.9 C) (07/11 1358) Temp Source: Oral (07/11 1358) BP: 113/69 (07/11 1358) Pulse Rate: 88 (07/11 1358)  Labs: Recent Labs    10/24/23 1026  HGB 9.6*  HCT 30.1*  PLT 239  LABPROT 31.1*  INR 2.8*  CREATININE 1.09    Estimated Creatinine Clearance: 86 mL/min (by C-G formula based on SCr of 1.09 mg/dL).   Medical History: Past Medical History:  Diagnosis Date   Blood dyscrasia    lupus anti coagulant   Cancer (HCC) 08/2016   renal    Chronic anticoagulation    INR goal 2.5-3.5   COPD (chronic obstructive pulmonary disease) (HCC)    Coronary artery disease involving native coronary artery of native heart without angina pectoris 2018   s/p SVG to RCA   Erectile dysfunction    GERD (gastroesophageal reflux disease)    History of bowel infarction    Hx of Hodgkin's lymphoma    Hx of lupus anticoagulant disorder    history of blood work showing lupus   Hyperlipidemia    Hypertension    Hypothyroidism    Pneumonia    hx   RBBB 10/09/2017   S/P aortic valve replacement with mechanical valve 11/29/2016   23 mm Sorin [employer] top hat bileaflet mechanical valve   S/P CABG x 1 11/29/2016   Severe aortic stenosis    Thyroid  disease    Tobacco dependency     Medications:  Medications Prior to Admission  Medication Sig Dispense Refill Last Dose/Taking   aspirin  EC 81 MG tablet Take 81 mg by mouth daily. Swallow whole.   10/23/2023 at 10:00 AM   warfarin (COUMADIN ) 5 MG tablet Take 1 tablet (5 mg total) by mouth daily. Managed by [facility] (Patient taking differently:  Take 2.5-5 mg by mouth See admin instructions. Take 5 mg by mouth at bedtime on Sun/Mon/Wed/Fri/Sat and 2.5 mg on Tues/Thurs)   10/23/2023 at 10:30 PM   atorvastatin  (LIPITOR ) 80 MG tablet Take 1 tablet (80 mg total) by mouth daily. 90 tablet 3    budesonide-formoterol (SYMBICORT) 160-4.5 MCG/ACT inhaler Inhale 2 puffs into the lungs 2 (two) times daily.      cefUROXime  (CEFTIN ) 500 MG tablet Take 1 tablet (500 mg total) by mouth 2 (two) times daily with a meal. (Patient not taking: Reported on 10/24/2023) 10 tablet 0 Not Taking   doxylamine, Sleep, (UNISOM) 25 MG tablet Take 50 mg by mouth at bedtime as needed for sleep.      ezetimibe  (ZETIA ) 10 MG tablet Take 1 tablet (10 mg total) by mouth daily. Please make yearly appt with Dr. Shlomo for November 2022 for future refills. Thank you 1st attempt 90 tablet 0    Homeopathic Products (LEG CRAMP RELIEF PO) Take 1 tablet by mouth daily as needed.      levothyroxine  (SYNTHROID ) 125 MCG tablet Take 125 mcg by mouth every morning.      losartan  (COZAAR ) 100 MG tablet Take 1 tablet (100 mg total) by mouth daily. Pt needs to make appt with provider for further refills - 1st attempt 90 tablet 0    Magnesium   125 MG CAPS Take 125 mg by mouth daily.      metFORMIN (GLUCOPHAGE) 500 MG tablet Take 500 mg by mouth daily.      methocarbamol  (ROBAXIN ) 500 MG tablet Take 1 tablet (500 mg total) by mouth every 8 (eight) hours as needed for muscle spasms. 20 tablet 0    metoprolol  tartrate (LOPRESSOR ) 50 MG tablet Take 1 tablet (50 mg total) by mouth 2 (two) times daily. Please make yearly appt with Dr. Shlomo for November 2022 for future refills. Thank you 1st attempt (Patient taking differently: Take 50 mg by mouth 2 (two) times daily.) 180 tablet 0    Omega-3 Fatty Acids (FISH OIL PO) Take 3 capsules by mouth at bedtime. 1200/360 MG      Potassium Chloride  (K+ POTASSIUM PO) Take 1 tablet by mouth daily.      PROAIR  HFA 108 (90 Base) MCG/ACT inhaler Inhale 2 puffs into  the lungs daily as needed for wheezing or shortness of breath.       Scheduled:   [START ON 10/25/2023] ipratropium-albuterol   3 mL Nebulization QID   [START ON 10/25/2023] pneumococcal 20-valent conjugate vaccine  0.5 mL Intramuscular Tomorrow-1000   warfarin  5 mg Oral Once   [START ON 10/25/2023] Warfarin - Pharmacist Dosing Inpatient   Does not apply q1600   PRN: acetaminophen  **OR** acetaminophen , ondansetron  **OR** ondansetron  (ZOFRAN ) IV  Assessment: 62 yoM on warfarin PTA for PMH lupus anticoagulant, Afib with mechanical AVR, as well as COPD, CAD s/p CABG who was admitted 7/11 for ECOPD. Pharmacy to continue warfarin while admitted.  Baseline INR therapeutic Prior anticoagulation: warfarin 2.5 mg Mon & Thurs; 5 mg all other days; last dose (presumably 2.5 mg) on 7/10  Significant events:  Today, 10/24/2023: CBC: Hgb low on admission, Plt WNL INR therapeutic Major drug interactions: macrolides, and broad spectrum abx in general, can increase warfarin sensitivity No bleeding issues per nursing Eating 100% of meals  Goal of Therapy: INR 2.5-3.5  Plan: Warfarin 5 mg PO tonight at 16:00 Daily INR CBC at least q72 hr while on warfarin Monitor for signs of bleeding or thrombosis   Bard Jeans, PharmD, BCPS [phone] 10/24/2023, 6:04 PM

## 2023-10-24 NOTE — Plan of Care (Incomplete)
  Problem: Education: Goal: Knowledge of General Education information will improve Description: Including pain rating scale, medication(s)/side effects and non-pharmacologic comfort measures Outcome: Progressing   Problem: Health Behavior/Discharge Planning: Goal: Ability to manage health-related needs will improve Outcome: Progressing   Problem: Clinical Measurements: Goal: Ability to maintain clinical measurements within normal limits will improve Outcome: Progressing Goal: Will remain free from infection Outcome: Progressing Goal: Diagnostic test results will improve Outcome: Progressing Goal: Respiratory complications will improve Outcome: Progressing   Problem: Safety: Goal: Ability to remain free from injury will improve Outcome: Progressing   Problem: Activity: Goal: Ability to tolerate increased activity will improve Outcome: Progressing   Problem: Clinical Measurements: Goal: Cardiovascular complication will be avoided Outcome: Adequate for Discharge   Problem: Activity: Goal: Risk for activity intolerance will decrease Outcome: Adequate for Discharge   Problem: Nutrition: Goal: Adequate nutrition will be maintained Outcome: Adequate for Discharge   Problem: Coping: Goal: Level of anxiety will decrease Outcome: Adequate for Discharge   Problem: Elimination: Goal: Will not experience complications related to bowel motility Outcome: Adequate for Discharge Goal: Will not experience complications related to urinary retention Outcome: Adequate for Discharge   Problem: Pain Managment: Goal: General experience of comfort will improve and/or be controlled Outcome: Adequate for Discharge   Problem: Skin Integrity: Goal: Risk for impaired skin integrity will decrease Outcome: Adequate for Discharge

## 2023-10-24 NOTE — H&P (Addendum)
 History and Physical    Patient: Michael Delgado FMW:[id number] DOB: 29-Jun-1960 DOA: 10/24/2023 DOS: the patient was seen and examined on 10/24/2023 PCP: Marsa Miquel Faden, MD  Patient coming from: Home  Chief Complaint:  Chief Complaint  Patient presents with   Shortness of Breath   HPI: Michael Delgado is a 63 y.o. male with medical history significant of lupus anticoagulant, renal cancer, severe aortic stenosis, aortic valve replacement with mechanical valve on warfarin, COPD, CAD, CABG, GERD, bowel infarction, Hodgkin's lymphoma, hyperlipidemia, hypertension, hypothyroidism, tobacco abuse who presented to the emergency department with dyspnea, fever, productive cough with hemoptysis.  3 to 4 days ago he had URI symptoms with sore throat and rhinorrhea. No chest pain, palpitations, diaphoresis, PND, orthopnea or pitting edema of the lower extremities.  No abdominal pain, nausea, emesis, diarrhea, constipation, melena or hematochezia.  No flank pain, dysuria, frequency or hematuria.  No polyuria, polydipsia, polyphagia or blurred vision.   Lab work: Urinalysis showed rare bacteria but was otherwise unremarkable.  CBC showed white count 12.6, hemoglobin 9.6 g/dL and platelets 760.  PT 31.1 and INR 2.8.  Troponin was 21 ng/L.  CMP showed a glucose of 100, BUN of 34 and corrected calcium  of 8.5 mg/dL, the rest of the CMP measurements were normal.  Unremarkable lactic acid.  Imaging: Portable 1 view chest radiograph showing multifocal pneumonia in the right lung.  No parapneumonic effusion.  CTA chest no findings suggesting PE.  There was multifocal infectious process in the right lung with associated small right parapneumonic effusion.  Follow-up recommended to ensure resolution.  T6 vertebral body and compression fracture of the side of known osseous hemangioma.  MRI of the thoracic spine recommended for further assessment.  Aortic atherosclerosis.   ED course: Initial vital signs were  temperature 99.6 F, pulse 95, respiration 26, BP 98/52 mmHg O2 sat 94% on room air.  Patient received azithromycin  500 mg IVPB, ceftriaxone  2 g IVPB, DuoNeb LR 1000 mm bolus and NS 1000 mL bolus.  Review of Systems: As mentioned in the history of present illness. All other systems reviewed and are negative.  Past Medical History:  Diagnosis Date   Blood dyscrasia    lupus anti coagulant   Cancer (HCC) 08/2016   renal    Chronic anticoagulation    INR goal 2.5-3.5   COPD (chronic obstructive pulmonary disease) (HCC)    Coronary artery disease involving native coronary artery of native heart without angina pectoris 2018   s/p SVG to RCA   Erectile dysfunction    GERD (gastroesophageal reflux disease)    History of bowel infarction    Hx of Hodgkin's lymphoma    Hx of lupus anticoagulant disorder    history of blood work showing lupus   Hyperlipidemia    Hypertension    Hypothyroidism    Pneumonia    hx   RBBB 10/09/2017   S/P aortic valve replacement with mechanical valve 11/29/2016   23 mm Sorin Carbomedics top hat bileaflet mechanical valve   S/P CABG x 1 11/29/2016   Severe aortic stenosis    Thyroid  disease    Tobacco dependency    Past Surgical History:  Procedure Laterality Date   ABDOMINAL SURGERY  1999   AORTIC VALVE REPLACEMENT N/A 11/29/2016   Procedure: AORTIC VALVE REPLACEMENT (AVR) using Carbonmedics Valve;  Surgeon: Dusty Sudie DEL, MD;  Location: [facility] OR;  Service: Open Heart Surgery;  Laterality: N/A;   BOWEL INFARCTION  1997   HAD SURGERY AND  FOUND TO HAVE LUPUS ANTICOAGULANT   CORONARY ARTERY BYPASS GRAFT N/A 11/29/2016   Procedure: CORONARY ARTERY BYPASS GRAFTING (CABG) x one, using right leg greater saphenous vein harvested endoscopically;  Surgeon: Dusty Sudie DEL, MD;  Location: [facility] OR;  Service: Open Heart Surgery;  Laterality: N/A;   HODGKIN  LYMPHOMA WITH RADIATION AND CHEMOTHERAPY  1984   RIGHT/LEFT HEART CATH AND CORONARY ANGIOGRAPHY N/A 10/23/2016    Procedure: Right/Left Heart Cath and Coronary Angiography;  Surgeon: Burnard Debby LABOR, MD;  Location: [facility] INVASIVE CV LAB;  Service: Cardiovascular;  Laterality: N/A;   ROBOTIC ASSITED PARTIAL NEPHRECTOMY Right 09/11/2016   Procedure: XI ROBOTIC ASSISTED RETROPERITONEAL PARTIAL NEPHRECTOMY;  Surgeon: Alvaro Hummer, MD;  Location: [facility] ORS;  Service: Urology;  Laterality: Right;   TEE WITHOUT CARDIOVERSION N/A 11/29/2016   Procedure: TRANSESOPHAGEAL ECHOCARDIOGRAM (TEE);  Surgeon: Dusty Sudie DEL, MD;  Location: [facility] OR;  Service: Open Heart Surgery;  Laterality: N/A;   THORACIC AORTOGRAM N/A 10/23/2016   Procedure: Thoracic Aortogram;  Surgeon: Burnard Debby LABOR, MD;  Location: [facility] INVASIVE CV LAB;  Service: Cardiovascular;  Laterality: N/A;   Social History:  reports that he quit smoking about 9 years ago. His smoking use included cigarettes. He has never used smokeless tobacco. He reports that he does not drink alcohol and does not use drugs.  No Known Allergies  Family History  Problem Relation Age of Onset   Arthritis Mother    Hypertension Mother    High Cholesterol Mother    Cancer - Ovarian Mother    Diabetes Father    Hypertension Father    Arthritis Father    High Cholesterol Father    Arthritis Sister    Heart disease Brother    Hypertension Brother    Healthy Brother    Asthma Sister    Heart disease Sister     Prior to Admission medications   Medication Sig Start Date End Date Taking? Authorizing Provider  aspirin  EC 81 MG tablet Take 81 mg by mouth daily. Swallow whole.    [provider]  atorvastatin  (LIPITOR ) 80 MG tablet Take 1 tablet (80 mg total) by mouth daily. 11/23/18   Hammond, Janine, NP  budesonide-formoterol (SYMBICORT) 160-4.5 MCG/ACT inhaler Inhale 2 puffs into the lungs 2 (two) times daily.    [provider]  cefUROXime  (CEFTIN ) 500 MG tablet Take 1 tablet (500 mg total) by mouth 2 (two) times daily with a meal. Patient not taking: Reported  on 10/07/2022 07/02/21   Rizwan, Saima, MD  doxylamine, Sleep, (UNISOM) 25 MG tablet Take 50 mg by mouth at bedtime as needed.    [provider]  ezetimibe  (ZETIA ) 10 MG tablet Take 1 tablet (10 mg total) by mouth daily. Please make yearly appt with Dr. Shlomo for November 2022 for future refills. Thank you 1st attempt 01/19/21   Shlomo Wilbert SAUNDERS, MD  Homeopathic Products (LEG CRAMP RELIEF PO) Take 1 tablet by mouth daily as needed.    [provider]  levothyroxine  (SYNTHROID ) 125 MCG tablet Take 125 mcg by mouth every morning. 06/27/21   [provider]  losartan  (COZAAR ) 100 MG tablet Take 1 tablet (100 mg total) by mouth daily. Pt needs to make appt with provider for further refills - 1st attempt 12/15/20   Shlomo Wilbert SAUNDERS, MD  Magnesium  125 MG CAPS Take 125 mg by mouth daily.    [provider]  metFORMIN (GLUCOPHAGE) 500 MG tablet Take 500 mg by mouth daily. 06/11/21  [provider]  methocarbamol  (ROBAXIN ) 500 MG tablet Take 1 tablet (500 mg total) by mouth every 8 (eight) hours as needed for muscle spasms. 12/12/17   Vann, Jessica U, DO  metoprolol  tartrate (LOPRESSOR ) 50 MG tablet Take 1 tablet (50 mg total) by mouth 2 (two) times daily. Please make yearly appt with Dr. Shlomo for November 2022 for future refills. Thank you 1st attempt 01/19/21   Shlomo Wilbert SAUNDERS, MD  Omega-3 Fatty Acids (FISH OIL PO) Take 3 capsules by mouth at bedtime. 1200/360 MG    [provider]  Potassium Chloride  (K+ POTASSIUM PO) Take 1 tablet by mouth daily.    [provider]  PROAIR  HFA 108 (90 Base) MCG/ACT inhaler Inhale 2 puffs into the lungs daily as needed for wheezing or shortness of breath.  12/19/16   [provider]  TRULICITY 0.75 MG/0.5ML SOPN Inject 0.75 mg into the skin once a week. Wednesdays 06/28/21   [provider]  warfarin (COUMADIN ) 5 MG tablet Take 1 tablet (5 mg total) by mouth daily. Managed by [facility] Patient taking differently: Take 5 mg by mouth. On Sunday, Monday, Wednesday and Friday. 06/29/18   Janene Boer, [place]    Physical Exam: Vitals:   10/24/23 1230 10/24/23 1245 10/24/23 1312 10/24/23 1358  BP: 103/65 96/63  113/69  Pulse: 87 87  88  Resp: 17 (!) 23  16  Temp:  99.6 F (37.6 C)  98.5 F (36.9 C)  TempSrc:  Oral  Oral  SpO2: 98% 100% (!) 89% 96%  Weight:      Height:       Physical Exam Vitals reviewed.  Constitutional:      General: He is awake. He is not in acute distress.    Appearance: He is obese. He is ill-appearing.     Interventions: Nasal cannula in place.  HENT:     Head: Normocephalic.     Nose: No rhinorrhea.     Mouth/Throat:     Mouth: Mucous membranes are moist.  Eyes:     General: No scleral icterus.    Pupils: Pupils are equal, round, and reactive to light.  Neck:     Vascular: No JVD.  Cardiovascular:     Rate and Rhythm: Normal rate and regular rhythm.     Heart sounds: S1 normal and S2 normal.  Pulmonary:     Breath sounds: Wheezing and rhonchi present. No rales.  Abdominal:     General: Bowel sounds are normal. There is no distension.     Palpations: Abdomen is soft.     Tenderness: There is no abdominal tenderness. There is no guarding.  Musculoskeletal:     Cervical back: Neck supple.     Right lower leg: No edema.     Left lower leg: No edema.  Skin:    General: Skin is warm and dry.  Neurological:     General: No focal deficit present.     Mental Status: He is alert and oriented to person, place, and time.  Psychiatric:        Mood and Affect: Mood normal.        Behavior: Behavior normal. Behavior is cooperative.     Data Reviewed:  Results are pending, will review when available.  11/04/2022 echocardiogram report IMPRESSIONS:   1. Left ventricular ejection fraction, by estimation, is 60 to 65%. The  left ventricle has normal function. The left ventricle has no regional  wall motion abnormalities.  There is  mild concentric left ventricular  hypertrophy. Left ventricular diastolic  function could not be evaluated.   2. Right ventricular systolic function is mildly reduced. The right  ventricular size is mildly enlarged.   3. Left atrial size was mildly dilated.   4. The mitral valve is degenerative. No evidence of mitral valve  regurgitation. Severe mitral annular calcification.   5. Tricuspid valve regurgitation is mild to moderate.   6. The aortic valve has been repaired/replaced. Aortic valve  regurgitation is not visualized. Echo findings are consistent with normal  structure and function of the aortic valve prosthesis. Aortic valve mean  gradient measures 16.0 mmHg. Aortic valve  Vmax measures 2.85 m/s. Aortic valve acceleration time measures 81 msec.   Comparison(s): Prior images reviewed side by side. RV is less vigorous  from prior; unable to estimate RVSP due to suboptimal IVC acquisition.   EKG: Vent. rate 93 BPM PR interval 235 ms QRS duration 145 ms QT/QTcB 370/461 ms P-R-T axes 59 205 31 Sinus rhythm Prolonged PR interval RBBB and LPFB Inferior infarct, old Lateral leads are also involved  Assessment and Plan: Principal Problem:   Acute respiratory failure with hypoxia (HCC) In the setting of:   Sepsis due to pneumonia (HCC) Superimposed on:   Chronic obstructive pulmonary disease (HCC) Admit to PCU/inpatient. Continue supplemental oxygen. Scheduled and as needed bronchodilators. Continue ceftriaxone  2 g IVPB daily. Continue azithromycin  500 mg IVPB daily. Check strep pneumoniae urinary antigen. Check sputum Gram stain, culture and sensitivity. Follow-up blood culture and sensitivity. Follow-up CBC and chemistry in the morning.  Active Problems:   S/P aortic valve replacement with metallic valve Continue warfarin per pharmacy.    Coronary artery disease involving native  coronary artery of native heart without angina pectoris Pending med rec: Continue  metoprolol  twice daily. Continue statin and warfarin.    Essential hypertension Continue metoprolol  and losartan .    Paroxysmal atrial fibrillation (HCC) CHA?DS?-VASc Score of at least 3. On warfarin. Continue beta-blocker for rate control.    Dyslipidemia Continue atorvastatin  80 mg p.o. daily after med rec done. Continue ezetimibe  10 mg p.o. pending med rec.    Hypothyroidism Continue levothyroxine  pending med rec.    Hypocalcemia Recheck calcium  with albumin  level in AM. Further workup depending on results.    T6 vertebral fracture (HCC)  No significant pain at the moment. Will obtain MRI thoracic spine as recommended by radiology.   Advance Care Planning:   Code Status: Full Code   Consults:   Family Communication: His wife was at bedside.  Severity of Illness: The appropriate patient status for this patient is INPATIENT. Inpatient status is judged to be reasonable and necessary in order to provide the required intensity of service to ensure the patient's safety. The patient's presenting symptoms, physical exam findings, and initial radiographic and laboratory data in the context of their chronic comorbidities is felt to place them at high risk for further clinical deterioration. Furthermore, it is not anticipated that the patient will be medically stable for discharge from the hospital within 2 midnights of admission.   * I certify that at the point of admission it is my clinical judgment that the patient will require inpatient hospital care spanning beyond 2 midnights from the point of admission due to high intensity of service, high risk for further deterioration and high frequency of surveillance required.*  Author: Alm Dorn Castor, MD 10/24/2023 2:22 PM  For on call review [email or web address].   This document was prepared  using Conservation officer, historic buildings and may contain some unintended transcription errors.

## 2023-10-24 NOTE — ED Notes (Signed)
   10/24/23 1015  Respiratory Assessment  $ RT Protocol Assessment  Yes  Assessment Type Pre-treatment  Respiratory Pattern Regular;Dyspnea at rest;Symmetrical;Accessory muscle use;Pursed lips  Chest Assessment Chest expansion symmetrical  Cough Congested;Productive  Sputum Consistency Thin  Sputum Specimen Source Spontaneous cough  R Upper  Breath Sounds Rhonchi  L Upper Breath Sounds Rhonchi  R Lower Breath Sounds Rhonchi;Diminished  L Lower Breath Sounds Rhonchi  Oxygen Therapy/Pulse Ox  O2 Device (S)  Room Air;Nasal Cannula (SpO2 88%)  O2 Therapy Oxygen  O2 Flow Rate (L/min) 2 L/min  SpO2 95 %   Bilat rhonchi, congested cough per family thin blood tinged secretions.  Also not taking inhaled corticosteroid meds as prescribed.

## 2023-10-25 ENCOUNTER — Inpatient Hospital Stay (HOSPITAL_COMMUNITY)

## 2023-10-25 ENCOUNTER — Encounter (HOSPITAL_COMMUNITY): Payer: Self-pay

## 2023-10-25 DIAGNOSIS — J189 Pneumonia, unspecified organism: Secondary | ICD-10-CM | POA: Diagnosis not present

## 2023-10-25 DIAGNOSIS — R55 Syncope and collapse: Secondary | ICD-10-CM

## 2023-10-25 DIAGNOSIS — A419 Sepsis, unspecified organism: Secondary | ICD-10-CM | POA: Diagnosis not present

## 2023-10-25 LAB — PROTIME-INR
INR: 2.4 — ABNORMAL HIGH (ref 0.8–1.2)
Prothrombin Time: 27.4 s — ABNORMAL HIGH (ref 11.4–15.2)

## 2023-10-25 LAB — RETICULOCYTES
Immature Retic Fract: 15.9 % (ref 2.3–15.9)
RBC.: 3.74 MIL/uL — ABNORMAL LOW (ref 4.22–5.81)
Retic Count, Absolute: 53.5 K/uL (ref 19.0–186.0)
Retic Ct Pct: 1.4 % (ref 0.4–3.1)

## 2023-10-25 LAB — VITAMIN B12: Vitamin B-12: 684 pg/mL (ref 180–914)

## 2023-10-25 LAB — ECHOCARDIOGRAM COMPLETE
AR max vel: 1.41 cm2
AV Area VTI: 1.73 cm2
AV Area mean vel: 1.29 cm2
AV Mean grad: 13.5 mmHg
AV Peak grad: 23 mmHg
Ao pk vel: 2.4 m/s
Height: 69 in
MV VTI: 1.36 cm2
S' Lateral: 2.7 cm
Weight: 3888 [oz_av]

## 2023-10-25 LAB — COMPREHENSIVE METABOLIC PANEL WITH GFR
ALT: 22 U/L (ref 0–44)
AST: 23 U/L (ref 15–41)
Albumin: 3.5 g/dL (ref 3.5–5.0)
Alkaline Phosphatase: 69 U/L (ref 38–126)
Anion gap: 10 (ref 5–15)
BUN: 29 mg/dL — ABNORMAL HIGH (ref 8–23)
CO2: 24 mmol/L (ref 22–32)
Calcium: 8.3 mg/dL — ABNORMAL LOW (ref 8.9–10.3)
Chloride: 105 mmol/L (ref 98–111)
Creatinine, Ser: 0.96 mg/dL (ref 0.61–1.24)
GFR, Estimated: >60 mL/min (ref >60)
Glucose, Bld: 208 mg/dL — ABNORMAL HIGH (ref 70–99)
Potassium: 4.1 mmol/L (ref 3.5–5.1)
Sodium: 139 mmol/L (ref 135–145)
Total Bilirubin: 0.5 mg/dL (ref 0.0–1.2)
Total Protein: 7.2 g/dL (ref 6.5–8.1)

## 2023-10-25 LAB — CBC
HCT: 32 % — ABNORMAL LOW (ref 39.0–52.0)
Hemoglobin: 9.8 g/dL — ABNORMAL LOW (ref 13.0–17.0)
MCH: 26.1 pg (ref 26.0–34.0)
MCHC: 30.6 g/dL (ref 30.0–36.0)
MCV: 85.3 fL (ref 80.0–100.0)
Platelets: 231 K/uL (ref 150–400)
RBC: 3.75 MIL/uL — ABNORMAL LOW (ref 4.22–5.81)
RDW: 16.6 % — ABNORMAL HIGH (ref 11.5–15.5)
WBC: 18.3 K/uL — ABNORMAL HIGH (ref 4.0–10.5)
nRBC: 0 % (ref 0.0–0.2)

## 2023-10-25 LAB — FOLATE: Folate: 7 ng/mL (ref >5.9)

## 2023-10-25 LAB — PROCALCITONIN: Procalcitonin: 4.19 ng/mL

## 2023-10-25 LAB — HIV ANTIBODY (ROUTINE TESTING W REFLEX): HIV Screen 4th Generation wRfx: NONREACTIVE

## 2023-10-25 LAB — GLUCOSE, CAPILLARY
Glucose-Capillary: 170 mg/dL — ABNORMAL HIGH (ref 70–99)
Glucose-Capillary: 175 mg/dL — ABNORMAL HIGH (ref 70–99)
Glucose-Capillary: 182 mg/dL — ABNORMAL HIGH (ref 70–99)

## 2023-10-25 LAB — TSH: TSH: 1.632 u[IU]/mL (ref 0.350–4.500)

## 2023-10-25 LAB — IRON AND TIBC
Iron: 23 ug/dL — ABNORMAL LOW (ref 45–182)
Saturation Ratios: 6 % — ABNORMAL LOW (ref 17.9–39.5)
TIBC: 377 ug/dL (ref 250–450)
UIBC: 354 ug/dL

## 2023-10-25 LAB — FERRITIN: Ferritin: 52 ng/mL (ref 24–336)

## 2023-10-25 MED ORDER — ATORVASTATIN CALCIUM 40 MG PO TABS
80.0000 mg | ORAL_TABLET | Freq: Every day | ORAL | Status: DC
  Administered 2023-10-25: 80 mg via ORAL
  Filled 2023-10-25: qty 2

## 2023-10-25 MED ORDER — SENNA 8.6 MG PO TABS
1.0000 | ORAL_TABLET | Freq: Every day | ORAL | Status: DC
  Administered 2023-10-25: 8.6 mg via ORAL
  Filled 2023-10-25 (×2): qty 1

## 2023-10-25 MED ORDER — WARFARIN SODIUM 5 MG PO TABS
5.0000 mg | ORAL_TABLET | Freq: Once | ORAL | Status: AC
  Administered 2023-10-25: 5 mg via ORAL
  Filled 2023-10-25: qty 1

## 2023-10-25 MED ORDER — LEVOTHYROXINE SODIUM 112 MCG PO TABS
224.0000 ug | ORAL_TABLET | ORAL | Status: DC
  Administered 2023-10-26: 224 ug via ORAL
  Filled 2023-10-25: qty 2

## 2023-10-25 MED ORDER — INSULIN ASPART 100 UNIT/ML IJ SOLN: 0.0000 [IU] | Freq: Every day | INTRAMUSCULAR | Status: DC

## 2023-10-25 MED ORDER — METOPROLOL TARTRATE 50 MG PO TABS
50.0000 mg | ORAL_TABLET | Freq: Two times a day (BID) | ORAL | Status: DC
  Administered 2023-10-25 – 2023-10-26 (×3): 50 mg via ORAL
  Filled 2023-10-25 (×3): qty 1

## 2023-10-25 MED ORDER — LEVOTHYROXINE SODIUM 112 MCG PO TABS: 112.0000 ug | ORAL_TABLET | ORAL | Status: DC

## 2023-10-25 MED ORDER — FLUTICASONE FUROATE-VILANTEROL 200-25 MCG/ACT IN AEPB
1.0000 | INHALATION_SPRAY | Freq: Every day | RESPIRATORY_TRACT | Status: DC
  Administered 2023-10-25 – 2023-10-26 (×2): 1 via RESPIRATORY_TRACT
  Filled 2023-10-25: qty 28

## 2023-10-25 MED ORDER — VITAMIN D 25 MCG (1000 UNIT) PO TABS
1000.0000 [IU] | ORAL_TABLET | Freq: Every day | ORAL | Status: DC
  Administered 2023-10-25: 1000 [IU] via ORAL
  Filled 2023-10-25: qty 1

## 2023-10-25 MED ORDER — LOSARTAN POTASSIUM 50 MG PO TABS
100.0000 mg | ORAL_TABLET | Freq: Every day | ORAL | Status: DC
  Administered 2023-10-25 – 2023-10-26 (×2): 100 mg via ORAL
  Filled 2023-10-25 (×2): qty 2

## 2023-10-25 MED ORDER — LEVOTHYROXINE SODIUM 112 MCG PO TABS
112.0000 ug | ORAL_TABLET | ORAL | Status: DC
  Administered 2023-10-25: 112 ug via ORAL
  Filled 2023-10-25 (×2): qty 1

## 2023-10-25 MED ORDER — INSULIN ASPART 100 UNIT/ML IJ SOLN
0.0000 [IU] | Freq: Three times a day (TID) | INTRAMUSCULAR | Status: DC
  Administered 2023-10-25 (×2): 2 [IU] via SUBCUTANEOUS
  Administered 2023-10-26: 1 [IU] via SUBCUTANEOUS

## 2023-10-25 MED ORDER — POLYETHYLENE GLYCOL 3350 17 G PO PACK: 17.0000 g | PACK | Freq: Every day | ORAL | Status: DC | PRN

## 2023-10-25 MED ORDER — DOXAZOSIN MESYLATE 2 MG PO TABS
2.0000 mg | ORAL_TABLET | Freq: Every day | ORAL | Status: DC
  Administered 2023-10-25: 2 mg via ORAL
  Filled 2023-10-25: qty 1

## 2023-10-25 MED ORDER — CALCIUM GLUCONATE-NACL 1-0.675 GM/50ML-% IV SOLN
1.0000 g | Freq: Once | INTRAVENOUS | Status: AC
  Administered 2023-10-25: 1000 mg via INTRAVENOUS
  Filled 2023-10-25: qty 50

## 2023-10-25 MED ORDER — PERFLUTREN LIPID MICROSPHERE
1.0000 mL | INTRAVENOUS | Status: AC | PRN
  Administered 2023-10-25: 2 mL via INTRAVENOUS

## 2023-10-25 MED ORDER — PANTOPRAZOLE SODIUM 40 MG PO TBEC
40.0000 mg | DELAYED_RELEASE_TABLET | Freq: Every day | ORAL | Status: DC
  Administered 2023-10-25 – 2023-10-26 (×2): 40 mg via ORAL
  Filled 2023-10-25 (×2): qty 1

## 2023-10-25 MED ORDER — EZETIMIBE 10 MG PO TABS
10.0000 mg | ORAL_TABLET | Freq: Every day | ORAL | Status: DC
  Administered 2023-10-25 – 2023-10-26 (×2): 10 mg via ORAL
  Filled 2023-10-25 (×2): qty 1

## 2023-10-25 NOTE — Progress Notes (Signed)
 PHARMACY - ANTICOAGULATION CONSULT NOTE  Pharmacy Consult for warfarin Indication: Afib, mech AVR, lupus anticoagulant  Allergies  Allergen Reactions   Doxycycline  Other (See Comments) and Hypertension    Headache and increases b/p    Patient Measurements: Height: 5' 9 (175.3 cm) Weight: 110.2 kg (243 lb) IBW/kg (Calculated) : 70.7 HEPARIN  DW (KG): 94.9  Vital Signs: Temp: 97.7 F (36.5 C) (07/12 0821) Temp Source: Oral (07/12 0821) BP: 162/80 (07/12 0821) Pulse Rate: 82 (07/12 0821)  Labs: Recent Labs    10/24/23 1026 10/25/23 0433  HGB 9.6* 9.8*  HCT 30.1* 32.0*  PLT 239 231  LABPROT 31.1* 27.4*  INR 2.8* 2.4*  CREATININE 1.09 0.96    Estimated Creatinine Clearance: 97.6 mL/min (by C-G formula based on SCr of 0.96 mg/dL).   Medical History: Past Medical History:  Diagnosis Date   Blood dyscrasia    lupus anti coagulant   Cancer (HCC) 08/2016   renal    Chronic anticoagulation    INR goal 2.5-3.5   COPD (chronic obstructive pulmonary disease) (HCC)    Coronary artery disease involving native coronary artery of native heart without angina pectoris 2018   s/p SVG to RCA   Erectile dysfunction    GERD (gastroesophageal reflux disease)    History of bowel infarction    Hx of Hodgkin's lymphoma    Hx of lupus anticoagulant disorder    history of blood work showing lupus   Hyperlipidemia    Hypertension    Hypothyroidism    Pneumonia    hx   RBBB 10/09/2017   S/P aortic valve replacement with mechanical valve 11/29/2016   23 mm Sorin Carbomedics top hat bileaflet mechanical valve   S/P CABG x 1 11/29/2016   Severe aortic stenosis    Thyroid  disease    Tobacco dependency     Medications:  Medications Prior to Admission  Medication Sig Dispense Refill Last Dose/Taking   aspirin  EC 81 MG tablet Take 81 mg by mouth daily. Swallow whole.   10/23/2023 at 10:00 AM   atorvastatin  (LIPITOR ) 80 MG tablet Take 1 tablet (80 mg total) by mouth daily. (Patient  taking differently: Take 80 mg by mouth at bedtime.) 90 tablet 3 10/23/2023 Bedtime   budesonide-formoterol (SYMBICORT) 160-4.5 MCG/ACT inhaler Inhale 2 puffs into the lungs 2 (two) times daily.   10/23/2023 Bedtime   Calcium -Magnesium -Zinc (CAL-MAG-ZINC PO) Take 1 tablet by mouth at bedtime.   10/23/2023 Bedtime   Cholecalciferol  (VITAMIN D3) 25 MCG (1000 UT) CAPS Take 1,000 Units by mouth at bedtime.   10/23/2023 Bedtime   Cyanocobalamin  (VITAMIN B-12) 2500 MCG SUBL Place 2,500 mcg under the tongue in the morning.   10/23/2023 Morning   doxazosin  (CARDURA ) 2 MG tablet Take 2 mg by mouth at bedtime.   10/23/2023 Bedtime   doxylamine, Sleep, (UNISOM) 25 MG tablet Take 25 mg by mouth at bedtime as needed for sleep.   Unknown   glipiZIDE (GLUCOTROL) 10 MG tablet Take 10 mg by mouth at bedtime.   10/23/2023 Bedtime   Homeopathic Products (LEG CRAMP RELIEF PO) Take 1 tablet by mouth at bedtime as needed (for leg cramps).   Unknown   levothyroxine  (SYNTHROID ) 112 MCG tablet Take 112-224 mcg by mouth See admin instructions. Take 224 mcg by mouth 30 minutes before breakfast on Sunday and 112 mcg on Mon/Tues/Wed/Thurs/Fri/Sat   10/23/2023 Morning   loratadine (CLARITIN) 10 MG tablet Take 10 mg by mouth in the morning.   10/23/2023 Morning   losartan -hydrochlorothiazide (  HYZAAR) 100-12.5 MG tablet Take 1 tablet by mouth daily.   10/23/2023 Morning   magnesium  oxide (MAG-OX) 400 (240 Mg) MG tablet Take 400 mg by mouth in the morning and at bedtime.   10/23/2023 Bedtime   metFORMIN (GLUCOPHAGE) 500 MG tablet Take 500-1,000 mg by mouth See admin instructions. Take 500 mg by mouth in the morning and 1,000 mg at bedtime   10/23/2023 Bedtime   metoprolol  tartrate (LOPRESSOR ) 50 MG tablet Take 1 tablet (50 mg total) by mouth 2 (two) times daily. Please make yearly appt with Dr. Shlomo for November 2022 for future refills. Thank you 1st attempt (Patient taking differently: Take 50 mg by mouth 2 (two) times daily.) 180 tablet 0  10/23/2023 Bedtime   Omega-3 Fatty Acids (FISH OIL PO) Take 3 capsules by mouth at bedtime.   10/23/2023 Bedtime   omeprazole (PRILOSEC) 40 MG capsule Take 40 mg by mouth in the morning and at bedtime.   10/23/2023 Bedtime   OVER THE COUNTER MEDICATION Take 11.5 oz by mouth See admin instructions. [employer] Nutrition drink- Drink 11.5 ounces by mouth once a day   10/23/2023   Potassium 99 MG TABS Take 99 mg by mouth at bedtime.   10/23/2023 Bedtime   PROAIR  HFA 108 (90 Base) MCG/ACT inhaler Inhale 2 puffs into the lungs every 6 (six) hours as needed for wheezing or shortness of breath.   Unknown   warfarin (COUMADIN ) 5 MG tablet Take 1 tablet (5 mg total) by mouth daily. Managed by [facility] (Patient taking differently: Take 2.5-5 mg by mouth See admin instructions. Take 5 mg by mouth at bedtime on Sun/Mon/Wed/Fri/Sat and 2.5 mg on Tues/Thurs)   10/23/2023 at 10:30 PM   cefUROXime  (CEFTIN ) 500 MG tablet Take 1 tablet (500 mg total) by mouth 2 (two) times daily with a meal. (Patient not taking: Reported on 10/24/2023) 10 tablet 0 Not Taking   ezetimibe  (ZETIA ) 10 MG tablet Take 1 tablet (10 mg total) by mouth daily. Please make yearly appt with Dr. Shlomo for November 2022 for future refills. Thank you 1st attempt (Patient taking differently: Take 10 mg by mouth daily.) 90 tablet 0 Unknown   losartan  (COZAAR ) 100 MG tablet Take 1 tablet (100 mg total) by mouth daily. Pt needs to make appt with provider for further refills - 1st attempt (Patient not taking: Reported on 10/24/2023) 90 tablet 0 Not Taking   methocarbamol  (ROBAXIN ) 500 MG tablet Take 1 tablet (500 mg total) by mouth every 8 (eight) hours as needed for muscle spasms. (Patient not taking: Reported on 10/24/2023) 20 tablet 0 Not Taking   Scheduled:   atorvastatin   80 mg Oral QHS   cholecalciferol   1,000 Units Oral QHS   doxazosin   2 mg Oral QHS   ezetimibe   10 mg Oral Daily   fluticasone  furoate-vilanterol  1 puff Inhalation  Daily   insulin  aspart  0-5 Units Subcutaneous QHS   insulin  aspart  0-9 Units Subcutaneous TID WC   ipratropium-albuterol   3 mL Nebulization QID   levothyroxine   112 mcg Oral Once per day on Monday Tuesday Wednesday Thursday Friday Saturday   And   [START ON 10/26/2023] levothyroxine   224 mcg Oral Every Sunday   losartan   100 mg Oral Daily   metoprolol  tartrate  50 mg Oral BID   pantoprazole   40 mg Oral Daily   pneumococcal 20-valent conjugate vaccine  0.5 mL Intramuscular Tomorrow-1000   senna  1 tablet Oral Daily   Warfarin -  Pharmacist Dosing Inpatient   Does not apply q1600   PRN: acetaminophen  **OR** acetaminophen , ondansetron  **OR** ondansetron  (ZOFRAN ) IV, polyethylene glycol  Assessment: 62 yoM on warfarin PTA for PMH lupus anticoagulant, Afib with mechanical AVR, as well as COPD, CAD s/p CABG who was admitted 7/11 for ECOPD. Pharmacy to continue warfarin while admitted.  Baseline INR therapeutic Prior anticoagulation (per med rec): warfarin 2.5 mg Tues & Thurs; 5 mg all other days; last dose (presumably 2.5 mg) on 7/10  Significant events:  Today, 10/25/2023: CBC: Hgb low, stable; Plts WNL INR 2.4--slightly subtherapeutic Major drug interactions: macrolides, and broad spectrum abx in general, can increase warfarin sensitivity No bleeding issues documented  Eating 100% of meals  Goal of Therapy: INR 2.5-3.5  Plan: Warfarin 5 mg PO tonight at 16:00 Daily INR CBC at least q72 hr while on warfarin Monitor for signs of bleeding or thrombosis   Lacinda Moats, PharmD Clinical Pharmacist  7/12/202510:31 AM

## 2023-10-25 NOTE — Progress Notes (Signed)
  Echocardiogram 2D Echocardiogram has been performed.  Tinnie FORBES Gosling RDCS 10/25/2023, 10:41 AM

## 2023-10-25 NOTE — Evaluation (Signed)
 Clinical/Bedside Swallow Evaluation Patient Details  Name: Michael Delgado MRN: [id number] Date of Birth: 03-01-1961  Today's Date: 10/25/2023 Time: SLP Start Time (ACUTE ONLY): 1550 SLP Stop Time (ACUTE ONLY): 1611 SLP Time Calculation (min) (ACUTE ONLY): 21 min  Past Medical History:  Past Medical History:  Diagnosis Date   Blood dyscrasia    lupus anti coagulant   Cancer (HCC) 08/2016   renal    Chronic anticoagulation    INR goal 2.5-3.5   COPD (chronic obstructive pulmonary disease) (HCC)    Coronary artery disease involving native coronary artery of native heart without angina pectoris 2018   s/p SVG to RCA   Erectile dysfunction    GERD (gastroesophageal reflux disease)    History of bowel infarction    Hx of Hodgkin's lymphoma    Hx of lupus anticoagulant disorder    history of blood work showing lupus   Hyperlipidemia    Hypertension    Hypothyroidism    Pneumonia    hx   RBBB 10/09/2017   S/P aortic valve replacement with mechanical valve 11/29/2016   23 mm Sorin Carbomedics top hat bileaflet mechanical valve   S/P CABG x 1 11/29/2016   Severe aortic stenosis    Thyroid  disease    Tobacco dependency    Past Surgical History:  Past Surgical History:  Procedure Laterality Date   ABDOMINAL SURGERY  1999   AORTIC VALVE REPLACEMENT N/A 11/29/2016   Procedure: AORTIC VALVE REPLACEMENT (AVR) using [employer]med[employer] Valve;  Surgeon: Dusty Sudie DEL, MD;  Location: [facility] OR;  Service: Open Heart Surgery;  Laterality: N/A;   BOWEL INFARCTION  1997   HAD SURGERY AND FOUND TO HAVE LUPUS ANTICOAGULANT   CORONARY ARTERY BYPASS GRAFT N/A 11/29/2016   Procedure: CORONARY ARTERY BYPASS GRAFTING (CABG) x one, using right leg greater saphenous vein harvested endoscopically;  Surgeon: Dusty Sudie DEL, MD;  Location: [facility] OR;  Service: Open Heart Surgery;  Laterality: N/A;   HODGKIN  LYMPHOMA WITH RADIATION AND CHEMOTHERAPY  1984   RIGHT/LEFT HEART CATH AND CORONARY ANGIOGRAPHY N/A  10/23/2016   Procedure: Right/Left Heart Cath and Coronary Angiography;  Surgeon: Burnard Debby LABOR, MD;  Location: [facility] INVASIVE CV LAB;  Service: Cardiovascular;  Laterality: N/A;   ROBOTIC ASSITED PARTIAL NEPHRECTOMY Right 09/11/2016   Procedure: XI ROBOTIC ASSISTED RETROPERITONEAL PARTIAL NEPHRECTOMY;  Surgeon: Alvaro Hummer, MD;  Location: [facility] ORS;  Service: Urology;  Laterality: Right;   TEE WITHOUT CARDIOVERSION N/A 11/29/2016   Procedure: TRANSESOPHAGEAL ECHOCARDIOGRAM (TEE);  Surgeon: Dusty Sudie DEL, MD;  Location: [facility] OR;  Service: Open Heart Surgery;  Laterality: N/A;   THORACIC AORTOGRAM N/A 10/23/2016   Procedure: Thoracic Aortogram;  Surgeon: Burnard Debby LABOR, MD;  Location: [facility] INVASIVE CV LAB;  Service: Cardiovascular;  Laterality: N/A;   HPI:  Michael Delgado is a 63 y.o. male with medical history significant of lupus anticoagulant, renal cancer, severe aortic stenosis, aortic valve replacement with mechanical valve on warfarin, COPD, CAD, CABG, GERD, bowel infarction, Hodgkin's lymphoma, hyperlipidemia, hypertension, hypothyroidism, tobacco abuse who presented to the emergency department with dyspnea, fever, productive cough with hemoptysis.  3 to 4 days ago he had URI symptoms with sore throat and rhinorrhea. No chest pain, palpitations, diaphoresis, PND, orthopnea or pitting edema of the lower extremities.  No abdominal pain, nausea, emesis, diarrhea, constipation, melena or hematochezia.    Assessment / Plan / Recommendation  Clinical Impression  Pt seen for clinical swallow evaluation with normal oropharyngeal swallow noted throughout consumption of regular/thin  liquid dinner tray.  Pt given min verbal cues for use of respiratory/swallowing breathing strategies and slow rate/small bites and sips encouraged d/t PNA dx.  Pt noted globus sensation with medications and certain foods such as rice, but this is occasionally.  No overt s/s of aspiration noted during consumption of meal, timely  swallow and adequate mastication/no oral residue observed.   Discussed s/s of aspiration and if pt notices any of these develop during meals, an OP MBS may be warranted to r/o associated dysphagia d/t reported PNA once or twice a year by pt.  Continue current diet with general swallowing precautions/respiratory swallowing reciprocity precautions in place.  No further ST recommended.  Thank you for this consult. SLP Visit Diagnosis: Dysphagia, unspecified (R13.10)    Aspiration Risk  Mild aspiration risk    Diet Recommendation   Thin;Age appropriate regular  Medication Administration: Whole meds with liquid (Take medications with liquids, but space out to lessen globus sensation)    Other  Recommendations Oral Care Recommendations: Oral care BID;Patient independent with oral care     Assistance Recommended at Discharge    Functional Status Assessment Patient has had a recent decline in their functional status and demonstrates the ability to make significant improvements in function in a reasonable and predictable amount of time.  Frequency and Duration  (evaluation only)          Prognosis Prognosis for improved oropharyngeal function: Good      Swallow Study   General Date of Onset: 10/24/23 HPI: Michael Delgado is a 63 y.o. male with medical history significant of lupus anticoagulant, renal cancer, severe aortic stenosis, aortic valve replacement with mechanical valve on warfarin, COPD, CAD, CABG, GERD, bowel infarction, Hodgkin's lymphoma, hyperlipidemia, hypertension, hypothyroidism, tobacco abuse who presented to the emergency department with dyspnea, fever, productive cough with hemoptysis.  3 to 4 days ago he had URI symptoms with sore throat and rhinorrhea. No chest pain, palpitations, diaphoresis, PND, orthopnea or pitting edema of the lower extremities.  No abdominal pain, nausea, emesis, diarrhea, constipation, melena or hematochezia. Type of Study: Bedside Swallow  Evaluation Previous Swallow Assessment: n/a Diet Prior to this Study: Regular;Thin liquids (Level 0) Temperature Spikes Noted: No Respiratory Status: Room air History of Recent Intubation: No Behavior/Cognition: Alert;Cooperative Oral Cavity Assessment: Within Functional Limits Oral Care Completed by SLP: No Oral Cavity - Dentition: Adequate natural dentition Vision: Functional for self-feeding Self-Feeding Abilities: Able to feed self Patient Positioning: Other (comment) (upright on side of bed) Baseline Vocal Quality: Hoarse (min) Volitional Cough: Strong Volitional Swallow: Able to elicit    Oral/Motor/Sensory Function Overall Oral Motor/Sensory Function: Within functional limits   Ice Chips Ice chips: Not tested   Thin Liquid Thin Liquid: Within functional limits Presentation: Cup    Nectar Thick Nectar Thick Liquid: Not tested   Honey Thick Honey Thick Liquid: Not tested   Puree Puree: Not tested   Solid     Solid: Within functional limits Presentation: Self Fed      Pat Manfred Laspina,M.S.,CCC-SLP 10/25/2023,4:58 PM

## 2023-10-25 NOTE — Plan of Care (Signed)

## 2023-10-25 NOTE — Progress Notes (Addendum)
 PROGRESS NOTE  Lior Hoen  DOB: 10-23-1960  PCP: Marsa Miquel Faden, MD FMW:[id number]  DOA: 10/24/2023  LOS: 1 day  Hospital Day: 2  Brief narrative: Michael Delgado is a 63 y.o. male with PMH significant for DM2, HTN, HLD, CAD s/p CABG, severe aortic stenosis s/p mechanical valve, lupus anticoagulant on chronic Coumadin , COPD, GERD, renal cancer, Hodgkin's lymphoma s/p radiation and neck area 1984 leading to chronic dysphagia.  7/11, patient presented to the ED with dyspnea, fever, cough, hemoptysis.  Symptoms started 2 days ago and progressively worsened.  In the ED, patient had a temperature of 99.6, blood pressure 90s, breathing on 2 L oxygen Labs with WC count 12.6, hemoglobin 9.6, lactic acid 1.4, troponin mildly elevated at 21 Urinalysis unremarkable Strep pneumoniae urine antigen negative Blood culture sent CT angio chest did not show pulmonary embolism but showed -Multifocal infectious/inflammatory process of right lung (pneumonia) with associated small right parapneumonic effusion.   -New sclerotic changes of T6 vertebral body and compression fracture at the site of known osseous hemangioma.   MRI thoracic spine showed -Mild compression deformity of T6, which is chronic and the likely secondary to an underlying hemangioma, which was readily apparent on the previous CTs of the thoracic spine. There is slight posterior bowing of the posterior wall of vertebral body common but no significant spinal canal or neural foraminal stenosis.  Patient was started on IV Rocephin , IV Flagyl, DuoNeb, IV fluid Admitted to [facility]  Subjective: Patient was seen and examined this morning. Pleasant elderly Caucasian male.  Lying on bed.  Not in distress.  Wife at bedside. Chart reviewed Blood pressure this morning in 170s Labs from this morning with WC count 18.3, hemoglobin 9.8, BUN/creatinine elevated to 29/0.96, INR 2.4, glucose 2 8  Assessment and plan: Sepsis POA Multifocal  pneumonia Acute exacerbation of COPD Acute respiratory failure with hypoxia  Presented with dyspnea, cough, hemoptysis, fever for few days WBC count elevated.  Obtain procalcitonin level CT chest as above with multiple infiltrates of the right lung with small right parapneumonic effusion Blood culture, sputum culture sent Started on IV Rocephin  and azithromycin  (QTc 461 ms) Not requiring supplemental oxygen this morning.  Has mild chronic cough.  Continues to have intermittent hematemesis. Continue bronchodilators Continue to monitor On 2 L oxygen by nasal cannula Recent Labs  Lab 10/24/23 1026 10/25/23 0433 10/25/23 0833  WBC 12.6* 18.3*  --   LATICACIDVEN 1.4  --   --   PROCALCITON  --   --  4.19   Chronic dysphagia Patient has history of Hodgkin's lymphoma s/p radiation and neck area in 1984 leading to chronic dysphagia. Obtain speech therapy evaluation  Severe AS s/p mechanical valve On Coumadin  dose per pharmacy Target INR 2.5-3.5 INR is 2.4 today.  Probably affected by antibiotics.  Will see the trend.  If continues to downtrend, may need to increase Coumadin  dose. Recent Labs  Lab 10/24/23 1026 10/25/23 0433  INR 2.8* 2.4*   Paroxysmal A-fib Continue beta-blocker and Coumadin   H/o lupus anticoagulant Coumadin  as above  Hypocalcemia Calcium  level low at 8.3 despite normal albumin  level. Give 1 dose of IV calcium  gluconate supplement today On calcium  and vitamin D  supplementation at home Recent Labs  Lab 10/24/23 1026 10/25/23 0433  CALCIUM  8.3* 8.3*    CAD s/p prior CABG HLD Currently with no anginal symptoms Continue beta-blocker, Coumadin , statin, Zetia . Patient is also on aspirin  81 mg daily.  He does not have any recent stent.  Given hemoptysis and  anemia, I would hold aspirin .  Hypertension PTA meds- metoprolol  tartrate 50 mg BID, Cardura  2 mg nightly, losartan  100 mg daily, HCTZ 12.5 mg daily. Blood pressure elevated this morning Resume  metoprolol , losartan  and Cardura .  Continue to monitor.   Type 2 diabetes mellitus with hyperglycemia A1c 8 from 2023.  Update A1c PTA meds-glipizide 10 mg nightly, metformin 5 mg a.m., 1000 mg p.m Keep oral meds on hold. Start SSI/Accu-Cheks Lab Results  Component Value Date   HGBA1C 8.0 (H) 07/01/2021   No results for input(s): GLUCAP in the last 168 hours.  Acute on chronic anemia On chart review, noted that patient's hemoglobin was less than 10 in the past as well but most recent labs from 2024 showed hemoglobin above 12.  Currently hemoglobin less than 10.  Patient denies any hemoptysis but I am not sure if it is severe enough to cause drop in hemoglobin.. Obtain anemia panel. On vitamin B12 supplementation assessment Continue PPI Recent Labs    10/24/23 1026 10/25/23 0433 10/25/23 0833  HGB 9.6* 9.8*  --   MCV 81.6 85.3  --   VITAMINB12  --   --  684  FOLATE  --   --  7.0  FERRITIN  --   --  52  TIBC  --   --  377  IRON  --   --  23*  RETICCTPCT  --   --  1.4   Hypothyroidism Continue levothyroxine  Recent Labs    10/25/23 0833  TSH 1.632     T6 hemangioma and chronic fracture CT scan and MRI as above. No symptoms. No intervention needed currently   Mobility: Independent.  Encourage ambulation  Goals of care   Code Status: Full Code     DVT prophylaxis:   warfarin (COUMADIN ) tablet 5 mg   Antimicrobials: IV Rocephin  and azithromycin  Fluid: None Consultants: None Family Communication: Wife at bedside  Status: Inpatient Level of care:  Progressive   Patient is from: Home Needs to continue in-hospital care: Needs IV antibiotics, SLP eval, INR monitoring Anticipated d/c to: Hopefully home in 1 to 2 days    Diet:  Diet Order             Diet heart healthy/carb modified Room service appropriate? Yes; Fluid consistency: Thin  Diet effective now                   Scheduled Meds:  atorvastatin   80 mg Oral QHS   cholecalciferol   1,000  Units Oral QHS   doxazosin   2 mg Oral QHS   ezetimibe   10 mg Oral Daily   fluticasone  furoate-vilanterol  1 puff Inhalation Daily   insulin  aspart  0-5 Units Subcutaneous QHS   insulin  aspart  0-9 Units Subcutaneous TID WC   ipratropium-albuterol   3 mL Nebulization QID   levothyroxine   112 mcg Oral Once per day on Monday Tuesday Wednesday Thursday Friday Saturday   And   [START ON 10/26/2023] levothyroxine   224 mcg Oral Every Sunday   losartan   100 mg Oral Daily   metoprolol  tartrate  50 mg Oral BID   pantoprazole   40 mg Oral Daily   pneumococcal 20-valent conjugate vaccine  0.5 mL Intramuscular Tomorrow-1000   senna  1 tablet Oral Daily   warfarin  5 mg Oral ONCE-1600   Warfarin - Pharmacist Dosing Inpatient   Does not apply q1600    PRN meds: acetaminophen  **OR** acetaminophen , ondansetron  **OR** ondansetron  (ZOFRAN ) IV, perflutren  lipid microspheres (DEFINITY ) IV  suspension, polyethylene glycol   Infusions:   azithromycin      cefTRIAXone  (ROCEPHIN )  IV 2 g (10/25/23 1050)    Antimicrobials: Anti-infectives (From admission, onward)    Start     Dose/Rate Route Frequency Ordered Stop   10/25/23 1100  cefTRIAXone  (ROCEPHIN ) 2 g in sodium chloride  0.9 % 100 mL IVPB        2 g 200 mL/hr over 30 Minutes Intravenous Every 24 hours 10/24/23 1421 10/30/23 1059   10/25/23 1100  azithromycin  (ZITHROMAX ) 500 mg in sodium chloride  0.9 % 250 mL IVPB        500 mg 250 mL/hr over 60 Minutes Intravenous Every 24 hours 10/24/23 1421 10/29/23 1059   10/24/23 1515  cefTRIAXone  (ROCEPHIN ) 1 g in sodium chloride  0.9 % 100 mL IVPB        1 g 200 mL/hr over 30 Minutes Intravenous  Once 10/24/23 1421 10/24/23 1530   10/24/23 1100  cefTRIAXone  (ROCEPHIN ) 1 g in sodium chloride  0.9 % 100 mL IVPB        1 g 200 mL/hr over 30 Minutes Intravenous  Once 10/24/23 1054 10/24/23 1136   10/24/23 1100  azithromycin  (ZITHROMAX ) 500 mg in sodium chloride  0.9 % 250 mL IVPB        500 mg 250 mL/hr over 60  Minutes Intravenous  Once 10/24/23 1054 10/24/23 1238       Objective: Vitals:   10/25/23 0821 10/25/23 0904  BP: (!) 162/80   Pulse: 82   Resp: 18   Temp: 97.7 F (36.5 C)   SpO2: 95% 96%    Intake/Output Summary (Last 24 hours) at 10/25/2023 1129 Last data filed at 10/24/2023 1550 Gross per 24 hour  Intake 2690 ml  Output --  Net 2690 ml   Filed Weights   10/24/23 1014  Weight: 110.2 kg   Weight change:  Body mass index is 35.88 kg/m.   Physical Exam: General exam: Pleasant, elderly Caucasian male.  Not in visible distress Skin: No rashes, lesions or ulcers. HEENT: Atraumatic, normocephalic, no obvious bleeding Lungs: Clear to auscultation bilaterally,  CVS: S1, S2, mechanical aortic valve sound GI/Abd: Soft, nontender, nondistended, bowel sound present,   CNS: Alert, awake, oriented x 3 Psychiatry: Mood appropriate. Extremities: No pedal edema, no calf tenderness  Data Review: I have personally reviewed the laboratory data and studies available.  F/u labs ordered Unresulted Labs (From admission, onward)     Start     Ordered   10/26/23 0500  CBC  Tomorrow morning,   R        10/25/23 1033   10/26/23 0500  Basic metabolic panel with GFR  Tomorrow morning,   R        10/25/23 1129   10/25/23 0745  Hemoglobin A1c  Add-on,   AD       Comments: To assess prior glycemic control    10/25/23 0744   10/25/23 0500  Protime-INR  Daily,   R      10/24/23 1431   10/24/23 1500  Troponin T  Once,   R        10/24/23 1500            Signed, Chapman Rota, MD [facility] 10/25/2023

## 2023-10-25 NOTE — Plan of Care (Incomplete)
  Problem: Education: Goal: Knowledge of General Education information will improve Description: Including pain rating scale, medication(s)/side effects and non-pharmacologic comfort measures Outcome: Progressing   Problem: Clinical Measurements: Goal: Ability to maintain clinical measurements within normal limits will improve Outcome: Progressing   Problem: Health Behavior/Discharge Planning: Goal: Ability to manage health-related needs will improve Outcome: Adequate for Discharge   Problem: Clinical Measurements: Goal: Will remain free from infection Outcome: Adequate for Discharge Goal: Diagnostic test results will improve Outcome: Adequate for Discharge Goal: Respiratory complications will improve Outcome: Adequate for Discharge Goal: Cardiovascular complication will be avoided Outcome: Adequate for Discharge   Problem: Activity: Goal: Risk for activity intolerance will decrease Outcome: Adequate for Discharge   Problem: Nutrition: Goal: Adequate nutrition will be maintained Outcome: Adequate for Discharge   Problem: Coping: Goal: Level of anxiety will decrease Outcome: Adequate for Discharge   Problem: Elimination: Goal: Will not experience complications related to bowel motility Outcome: Adequate for Discharge Goal: Will not experience complications related to urinary retention Outcome: Adequate for Discharge   Problem: Pain Managment: Goal: General experience of comfort will improve and/or be controlled Outcome: Adequate for Discharge   Problem: Safety: Goal: Ability to remain free from injury will improve Outcome: Adequate for Discharge   Problem: Skin Integrity: Goal: Risk for impaired skin integrity will decrease Outcome: Adequate for Discharge   Problem: Activity: Goal: Ability to tolerate increased activity will improve Outcome: Adequate for Discharge   Problem: Clinical Measurements: Goal: Ability to maintain a body temperature in the normal  range will improve Outcome: Adequate for Discharge   Problem: Respiratory: Goal: Ability to maintain adequate ventilation will improve Outcome: Adequate for Discharge

## 2023-10-26 DIAGNOSIS — J189 Pneumonia, unspecified organism: Secondary | ICD-10-CM | POA: Diagnosis not present

## 2023-10-26 DIAGNOSIS — A419 Sepsis, unspecified organism: Secondary | ICD-10-CM | POA: Diagnosis not present

## 2023-10-26 LAB — BASIC METABOLIC PANEL WITH GFR
Anion gap: 11 (ref 5–15)
BUN: 30 mg/dL — ABNORMAL HIGH (ref 8–23)
CO2: 23 mmol/L (ref 22–32)
Calcium: 8.5 mg/dL — ABNORMAL LOW (ref 8.9–10.3)
Chloride: 105 mmol/L (ref 98–111)
Creatinine, Ser: 0.91 mg/dL (ref 0.61–1.24)
GFR, Estimated: >60 mL/min (ref >60)
Glucose, Bld: 135 mg/dL — ABNORMAL HIGH (ref 70–99)
Potassium: 4.2 mmol/L (ref 3.5–5.1)
Sodium: 139 mmol/L (ref 135–145)

## 2023-10-26 LAB — GLUCOSE, CAPILLARY
Glucose-Capillary: 130 mg/dL — ABNORMAL HIGH (ref 70–99)
Glucose-Capillary: 117 mg/dL — ABNORMAL HIGH (ref 70–99)

## 2023-10-26 LAB — CBC
HCT: 30.3 % — ABNORMAL LOW (ref 39.0–52.0)
Hemoglobin: 9.3 g/dL — ABNORMAL LOW (ref 13.0–17.0)
MCH: 25.8 pg — ABNORMAL LOW (ref 26.0–34.0)
MCHC: 30.7 g/dL (ref 30.0–36.0)
MCV: 84.2 fL (ref 80.0–100.0)
Platelets: 245 K/uL (ref 150–400)
RBC: 3.6 MIL/uL — ABNORMAL LOW (ref 4.22–5.81)
RDW: 16.8 % — ABNORMAL HIGH (ref 11.5–15.5)
WBC: 18.6 K/uL — ABNORMAL HIGH (ref 4.0–10.5)
nRBC: 0 % (ref 0.0–0.2)

## 2023-10-26 LAB — TROPONIN T: Troponin T (Highly Sensitive): 17 ng/L (ref 0–22)

## 2023-10-26 LAB — PROTIME-INR
INR: 3.5 — ABNORMAL HIGH (ref 0.8–1.2)
Prothrombin Time: 36.4 s — ABNORMAL HIGH (ref 11.4–15.2)

## 2023-10-26 MED ORDER — HYDROXYZINE HCL 25 MG PO TABS
25.0000 mg | ORAL_TABLET | Freq: Once | ORAL | Status: AC
  Administered 2023-10-26: 25 mg via ORAL
  Filled 2023-10-26: qty 1

## 2023-10-26 MED ORDER — FLORANEX PO PACK: 1.0000 g | PACK | Freq: Three times a day (TID) | ORAL | 0 refills | Status: AC

## 2023-10-26 MED ORDER — SENNOSIDES-DOCUSATE SODIUM 8.6-50 MG PO TABS: 1.0000 | ORAL_TABLET | Freq: Every day | ORAL | 0 refills | Status: AC

## 2023-10-26 MED ORDER — IPRATROPIUM-ALBUTEROL 0.5-2.5 (3) MG/3ML IN SOLN: 3.0000 mL | Freq: Two times a day (BID) | RESPIRATORY_TRACT | Status: DC

## 2023-10-26 MED ORDER — MELATONIN 5 MG PO TABS
5.0000 mg | ORAL_TABLET | Freq: Once | ORAL | Status: AC
  Administered 2023-10-26: 5 mg via ORAL
  Filled 2023-10-26: qty 1

## 2023-10-26 MED ORDER — FERROUS SULFATE 325 (65 FE) MG PO TBEC: 325.0000 mg | DELAYED_RELEASE_TABLET | Freq: Every day | ORAL | 0 refills | Status: AC

## 2023-10-26 MED ORDER — CEFADROXIL 500 MG PO CAPS
500.0000 mg | ORAL_CAPSULE | Freq: Two times a day (BID) | ORAL | Status: DC
  Administered 2023-10-26: 500 mg via ORAL
  Filled 2023-10-26: qty 1

## 2023-10-26 MED ORDER — POLYETHYLENE GLYCOL 3350 17 G PO PACK: 17.0000 g | PACK | Freq: Every day | ORAL | 0 refills | Status: AC | PRN

## 2023-10-26 MED ORDER — CEFADROXIL 500 MG PO CAPS: 500.0000 mg | ORAL_CAPSULE | Freq: Two times a day (BID) | ORAL | 0 refills | Status: AC

## 2023-10-26 NOTE — Plan of Care (Signed)

## 2023-10-26 NOTE — Progress Notes (Signed)
 PHARMACY - ANTICOAGULATION CONSULT NOTE  Pharmacy Consult for warfarin Indication: Afib, mech AVR, lupus anticoagulant  Allergies  Allergen Reactions   Doxycycline  Other (See Comments) and Hypertension    Headache and increases b/p    Patient Measurements: Height: 5' 9 (175.3 cm) Weight: 110.2 kg (243 lb) IBW/kg (Calculated) : 70.7 HEPARIN  DW (KG): 94.9  Vital Signs: Temp: 99.6 F (37.6 C) (07/13 0900) Temp Source: Oral (07/13 0900) BP: 127/73 (07/13 0900) Pulse Rate: 88 (07/13 0900)  Labs: Recent Labs    10/24/23 1026 10/25/23 0433 10/26/23 0441  HGB 9.6* 9.8* 9.3*  HCT 30.1* 32.0* 30.3*  PLT 239 231 245  LABPROT 31.1* 27.4* 36.4*  INR 2.8* 2.4* 3.5*  CREATININE 1.09 0.96 0.91    Estimated Creatinine Clearance: 103 mL/min (by C-G formula based on SCr of 0.91 mg/dL).   Medical History: Past Medical History:  Diagnosis Date   Blood dyscrasia    lupus anti coagulant   Cancer (HCC) 08/2016   renal    Chronic anticoagulation    INR goal 2.5-3.5   COPD (chronic obstructive pulmonary disease) (HCC)    Coronary artery disease involving native coronary artery of native heart without angina pectoris 2018   s/p SVG to RCA   Erectile dysfunction    GERD (gastroesophageal reflux disease)    History of bowel infarction    Hx of Hodgkin's lymphoma    Hx of lupus anticoagulant disorder    history of blood work showing lupus   Hyperlipidemia    Hypertension    Hypothyroidism    Pneumonia    hx   RBBB 10/09/2017   S/P aortic valve replacement with mechanical valve 11/29/2016   23 mm Sorin [employer] top hat bileaflet mechanical valve   S/P CABG x 1 11/29/2016   Severe aortic stenosis    Thyroid  disease    Tobacco dependency     Medications:  Medications Prior to Admission  Medication Sig Dispense Refill Last Dose/Taking   aspirin  EC 81 MG tablet Take 81 mg by mouth daily. Swallow whole.   10/23/2023 at 10:00 AM   atorvastatin  (LIPITOR ) 80 MG tablet Take 1  tablet (80 mg total) by mouth daily. (Patient taking differently: Take 80 mg by mouth at bedtime.) 90 tablet 3 10/23/2023 Bedtime   budesonide-formoterol (SYMBICORT) 160-4.5 MCG/ACT inhaler Inhale 2 puffs into the lungs 2 (two) times daily.   10/23/2023 Bedtime   Calcium -Magnesium -Zinc (CAL-MAG-ZINC PO) Take 1 tablet by mouth at bedtime.   10/23/2023 Bedtime   Cholecalciferol  (VITAMIN D3) 25 MCG (1000 UT) CAPS Take 1,000 Units by mouth at bedtime.   10/23/2023 Bedtime   Cyanocobalamin  (VITAMIN B-12) 2500 MCG SUBL Place 2,500 mcg under the tongue in the morning.   10/23/2023 Morning   doxazosin  (CARDURA ) 2 MG tablet Take 2 mg by mouth at bedtime.   10/23/2023 Bedtime   doxylamine, Sleep, (UNISOM) 25 MG tablet Take 25 mg by mouth at bedtime as needed for sleep.   Unknown   glipiZIDE (GLUCOTROL) 10 MG tablet Take 10 mg by mouth at bedtime.   10/23/2023 Bedtime   Homeopathic Products (LEG CRAMP RELIEF PO) Take 1 tablet by mouth at bedtime as needed (for leg cramps).   Unknown   levothyroxine  (SYNTHROID ) 112 MCG tablet Take 112-224 mcg by mouth See admin instructions. Take 224 mcg by mouth 30 minutes before breakfast on Sunday and 112 mcg on Mon/Tues/Wed/Thurs/Fri/Sat   10/23/2023 Morning   loratadine (CLARITIN) 10 MG tablet Take 10 mg by mouth in the  morning.   10/23/2023 Morning   losartan -hydrochlorothiazide (HYZAAR) 100-12.5 MG tablet Take 1 tablet by mouth daily.   10/23/2023 Morning   magnesium  oxide (MAG-OX) 400 (240 Mg) MG tablet Take 400 mg by mouth in the morning and at bedtime.   10/23/2023 Bedtime   metFORMIN (GLUCOPHAGE) 500 MG tablet Take 500-1,000 mg by mouth See admin instructions. Take 500 mg by mouth in the morning and 1,000 mg at bedtime   10/23/2023 Bedtime   metoprolol  tartrate (LOPRESSOR ) 50 MG tablet Take 1 tablet (50 mg total) by mouth 2 (two) times daily. Please make yearly appt with Dr. Shlomo for November 2022 for future refills. Thank you 1st attempt (Patient taking differently: Take 50 mg  by mouth 2 (two) times daily.) 180 tablet 0 10/23/2023 Bedtime   Omega-3 Fatty Acids (FISH OIL PO) Take 3 capsules by mouth at bedtime.   10/23/2023 Bedtime   omeprazole (PRILOSEC) 40 MG capsule Take 40 mg by mouth in the morning and at bedtime.   10/23/2023 Bedtime   OVER THE COUNTER MEDICATION Take 11.5 oz by mouth See admin instructions. [employer] Nutrition drink- Drink 11.5 ounces by mouth once a day   10/23/2023   Potassium 99 MG TABS Take 99 mg by mouth at bedtime.   10/23/2023 Bedtime   PROAIR  HFA 108 (90 Base) MCG/ACT inhaler Inhale 2 puffs into the lungs every 6 (six) hours as needed for wheezing or shortness of breath.   Unknown   warfarin (COUMADIN ) 5 MG tablet Take 1 tablet (5 mg total) by mouth daily. Managed by [facility] (Patient taking differently: Take 2.5-5 mg by mouth See admin instructions. Take 5 mg by mouth at bedtime on Sun/Mon/Wed/Fri/Sat and 2.5 mg on Tues/Thurs)   10/23/2023 at 10:30 PM   cefUROXime  (CEFTIN ) 500 MG tablet Take 1 tablet (500 mg total) by mouth 2 (two) times daily with a meal. (Patient not taking: Reported on 10/24/2023) 10 tablet 0 Not Taking   ezetimibe  (ZETIA ) 10 MG tablet Take 1 tablet (10 mg total) by mouth daily. Please make yearly appt with Dr. Shlomo for November 2022 for future refills. Thank you 1st attempt (Patient taking differently: Take 10 mg by mouth daily.) 90 tablet 0 Unknown   losartan  (COZAAR ) 100 MG tablet Take 1 tablet (100 mg total) by mouth daily. Pt needs to make appt with provider for further refills - 1st attempt (Patient not taking: Reported on 10/24/2023) 90 tablet 0 Not Taking   methocarbamol  (ROBAXIN ) 500 MG tablet Take 1 tablet (500 mg total) by mouth every 8 (eight) hours as needed for muscle spasms. (Patient not taking: Reported on 10/24/2023) 20 tablet 0 Not Taking   Scheduled:   atorvastatin   80 mg Oral QHS   cholecalciferol   1,000 Units Oral QHS   doxazosin   2 mg Oral QHS   ezetimibe   10 mg Oral Daily    fluticasone  furoate-vilanterol  1 puff Inhalation Daily   insulin  aspart  0-5 Units Subcutaneous QHS   insulin  aspart  0-9 Units Subcutaneous TID WC   ipratropium-albuterol   3 mL Nebulization BID   levothyroxine   112 mcg Oral Once per day on Monday Tuesday Wednesday Thursday Friday Saturday   And   levothyroxine   224 mcg Oral Every Sunday   losartan   100 mg Oral Daily   metoprolol  tartrate  50 mg Oral BID   pantoprazole   40 mg Oral Daily   pneumococcal 20-valent conjugate vaccine  0.5 mL Intramuscular Tomorrow-1000   senna  1 tablet  Oral Daily   Warfarin - Pharmacist Dosing Inpatient   Does not apply q1600   PRN: acetaminophen  **OR** acetaminophen , ondansetron  **OR** ondansetron  (ZOFRAN ) IV, polyethylene glycol  Assessment: 62 yoM on warfarin PTA for PMH lupus anticoagulant, Afib with mechanical AVR, as well as COPD, CAD s/p CABG who was admitted 7/11 for ECOPD. Pharmacy to continue warfarin while admitted.  Baseline INR therapeutic Prior anticoagulation (per med rec): warfarin 2.5 mg Tues & Thurs; 5 mg all other days; last dose (presumably 2.5 mg) on 7/10  Significant events:  Today, 10/26/2023: CBC: Hgb low, stable; Plts WNL INR 2.4 >> 3.5 from day prior--upper end of therapeutic range Major drug interactions: statins, macrolides, and broad spectrum abx in general, can increase warfarin sensitivity No s/sx of bleeding, per RN Eating 100% of meals as of 7/11  Goal of Therapy: INR 2.5-3.5  Plan: Hold warfarin today given INR increase from day prior and INR now upper end of normal range  Daily INR CBC at least q72 hr while on warfarin Monitor for signs of bleeding or thrombosis   Lacinda Moats, PharmD Clinical Pharmacist  7/13/202510:34 AM

## 2023-10-26 NOTE — Progress Notes (Signed)
   10/26/23 1243  TOC Brief Assessment  Insurance and Status Reviewed  Patient has primary care physician Yes  Home environment has been reviewed single family home  Prior level of function: independent  Prior/Current Home Services No current home services  Social Drivers of Health Review SDOH reviewed no interventions necessary  Readmission risk has been reviewed Yes  Transition of care needs no transition of care needs at this time    Heather Saltness, MSW, LCSW 10/26/2023 12:43 PM

## 2023-10-26 NOTE — Plan of Care (Signed)
  Problem: Education: Goal: Knowledge of General Education information will improve Description: Including pain rating scale, medication(s)/side effects and non-pharmacologic comfort measures Outcome: Adequate for Discharge   Problem: Health Behavior/Discharge Planning: Goal: Ability to manage health-related needs will improve Outcome: Adequate for Discharge   Problem: Clinical Measurements: Goal: Ability to maintain clinical measurements within normal limits will improve Outcome: Adequate for Discharge Goal: Will remain free from infection Outcome: Adequate for Discharge Goal: Diagnostic test results will improve Outcome: Adequate for Discharge Goal: Respiratory complications will improve Outcome: Adequate for Discharge Goal: Cardiovascular complication will be avoided Outcome: Adequate for Discharge   Problem: Nutrition: Goal: Adequate nutrition will be maintained Outcome: Adequate for Discharge   Problem: Coping: Goal: Level of anxiety will decrease Outcome: Adequate for Discharge   

## 2023-10-26 NOTE — Discharge Summary (Signed)
 Physician Discharge Summary  [name] FMW:[id number] DOB: [birth year] DOA: 10/24/2023  PCP: Marsa Miquel Faden, MD  Admit date: 10/24/2023 Discharge date: 10/26/2023  Admitted From: Home Discharge disposition: Home  Recommendations at discharge:  Complete course of antibiotics with 5 more days of oral cefadroxil  with probiotics INR is 3.5 today.  Please skip today's dose of Coumadin  Follow-up with PCP next 2 days to monitor for INR and WBC count Your iron level is low, you have been started on daily iron pills To avoid constipation getting worse with iron, I have started you on scheduled and PRN bowel regimen.  Brief narrative: Michael Delgado is a 63 y.o. male with PMH significant for DM2, HTN, HLD, CAD s/p CABG, severe aortic stenosis s/p mechanical valve, lupus anticoagulant on chronic Coumadin , COPD, GERD, renal cancer, Hodgkin's lymphoma s/p radiation and neck area 1984 leading to chronic dysphagia.  7/11, patient presented to the ED with dyspnea, fever, cough, hemoptysis.  Symptoms started 2 days ago and progressively worsened.  In the ED, patient had a temperature of 99.6, blood pressure 90s, breathing on 2 L oxygen Labs with WC count 12.6, hemoglobin 9.6, lactic acid 1.4, troponin mildly elevated at 21 Urinalysis unremarkable Strep pneumoniae urine antigen negative Blood culture sent CT angio chest did not show pulmonary embolism but showed -Multifocal infectious/inflammatory process of right lung (pneumonia) with associated small right parapneumonic effusion.   -New sclerotic changes of T6 vertebral body and compression fracture at the site of known osseous hemangioma.   MRI thoracic spine showed -Mild compression deformity of T6, which is chronic and the likely secondary to an underlying hemangioma, which was readily apparent on the previous CTs of the thoracic spine. There is slight posterior bowing of the posterior wall of vertebral body common but no significant  spinal canal or neural foraminal stenosis.  Patient was started on IV Rocephin , IV Flagyl, DuoNeb, IV fluid Admitted to [facility]  Subjective: Patient was seen and examined this morning. Pleasant elderly Caucasian male.  Lying down on bed.  Feels better.  No longer coughing up blood.   Bed was not comfortable and hence did not sleep well last night.  Otherwise feels good.  Was able to ambulate on the hallway without supplemental oxygen.   Wife at bedside corroborated the information. No fever Labs from this morning with WBC count 18.6, INR 3.5  Assessment and plan: Sepsis POA Multifocal pneumonia Acute exacerbation of COPD Acute respiratory failure with hypoxia  Presented with dyspnea, cough, hemoptysis, fever for few days WBC count elevated.  Obtain procalcitonin level CT chest as above with multiple infiltrates of the right lung with small right parapneumonic effusion Blood culture, sputum culture sent Started on IV Rocephin  and azithromycin  (QTc 461 ms) Clinically improving.  Cough improved, hemoptysis stopped.  Able to ambulate on the hallway without supplemental oxygen. No fever.  Feels great.   Wants to go home.   I expressed my concern about his WBC count which is stable but still elevated at 18.6.  Patient's wife states that she can have a follow-up appointment with his PCP on Tuesday 7/15.  I have advised to obtain lab including CBC and INR. Completed 3 days of azithromycin . Discussed with pharmacy.  Will plan to discharge him on 5 more days of oral cefadroxil  with probiotics.   Recent Labs  Lab 10/24/23 1026 10/25/23 0433 10/25/23 0833 10/26/23 0441  WBC 12.6* 18.3*  --  18.6*  LATICACIDVEN 1.4  --   --   --  PROCALCITON  --   --  4.19  --    Chronic dysphagia Patient has history of Hodgkin's lymphoma s/p radiation and neck area in 1984 leading to chronic dysphagia. Speech therapy evaluation was obtained.  Mild aspiration risk noted.  Advised aspiration  precautions.  Severe AS s/p mechanical valve On Coumadin  dose per pharmacy Target INR 2.5-3.5 INR jumped from 2.4 yesterday to 3.5 today.  Discussed with pharmacy, primarily due to azithromycin .  Completed 3-day course of azithromycin .  Will be discharged on cefadroxil  which may not have bigger effect. Anyway, he will follow-up with PCP on 7/15 for INR monitoring. Given elevated INR, advised to skip Coumadin  dose today. Recent Labs  Lab 10/24/23 1026 10/25/23 0433 10/26/23 0441  INR 2.8* 2.4* 3.5*   Paroxysmal A-fib Continue beta-blocker and Coumadin   H/o lupus anticoagulant Coumadin  plan as above  Hypocalcemia Calcium  level low at 8.3 despite normal albumin  level. IV calcium  gluconate given On calcium  and vitamin D  supplementation at home Recent Labs  Lab 10/24/23 1026 10/25/23 0433 10/26/23 0441  CALCIUM  8.3* 8.3* 8.5*    CAD s/p prior CABG HLD Currently with no anginal symptoms Continue beta-blocker, Coumadin , statin, Zetia . Patient is also on aspirin  81 mg daily.  He does not have any recent stent.  Given hemoptysis and anemia, I would hold aspirin .  Discussed with patient and wife.  Hypertension PTA meds- metoprolol  tartrate 50 mg BID, Cardura  2 mg nightly, losartan  100 mg daily, HCTZ 12.5 mg daily. Continue all..   Type 2 diabetes mellitus with hyperglycemia A1c 8 from 2023.  PTA meds-glipizide 10 mg nightly, metformin 5 mg a.m., 1000 mg p.m Okay to resume both Recent Labs  Lab 10/25/23 1143 10/25/23 1641 10/25/23 2041 10/26/23 0809  GLUCAP 170* 175* 182* 130*   Acute on chronic anemia On chart review, noted that patient's hemoglobin was less than 10 in the past as well but most recent labs from 2024 showed hemoglobin above 12.  Currently hemoglobin less than 10.  Patient denies any hemoptysis but I am not sure if it is severe enough to cause drop in hemoglobin. Anemia panel showed ferritin level 52, iron level at 23. Started on iron pills Continue  vitamin B12 supplementation  Continue PPI Recent Labs    10/24/23 1026 10/25/23 0433 10/25/23 0833 10/26/23 0441  HGB 9.6* 9.8*  --  9.3*  MCV 81.6 85.3  --  84.2  VITAMINB12  --   --  684  --   FOLATE  --   --  7.0  --   FERRITIN  --   --  52  --   TIBC  --   --  377  --   IRON  --   --  23*  --   RETICCTPCT  --   --  1.4  --    Hypothyroidism Continue levothyroxine  Recent Labs    10/25/23 0833  TSH 1.632     Constipation Started on bowel regimen scheduled and as needed.  Especially needed since he has been started on oral iron  T6 hemangioma and chronic fracture CT scan and MRI as above. No symptoms. No intervention needed currently   Mobility: Independently able to ambulate  Goals of care   Code Status: Full Code   Diet:  Diet Order             Diet general           Diet heart healthy/carb modified Room service appropriate? Yes; Fluid consistency: Thin  Diet effective now                   Nutritional status:  Body mass index is 35.88 kg/m.       Wounds:  - Incision - 6 Ports Other (Comment) 1: Right;Other (Comment);Mid 2: Right;Other (Comment);Upper 3: Right;Other (Comment);Mid;Lateral 4: Right;Other (Comment);Lower 5: Right;Upper;Lateral 6: Right;Lateral (Active)  Placement Date/Time: 09/11/16 1030   Location of Ports: (c) Other (Comment)  Port: 1:  Location Orientation: (c) Right;Other (Comment);Mid  Port: 2:  Location Orientation: (c) Right;Other (Comment);Upper  Port: 3:  Location Orientation: (c) Right;Othe...    Assessments 09/11/2016 12:58 PM 09/12/2016  7:59 AM  Port 1 Drainage Amount -- None  Port 1 Dressing Type Liquid skin adhesive Liquid skin adhesive  Port 1 Dressing Status -- Dry;Intact  Port 2 Drainage Amount -- None  Port 2 Dressing Type Liquid skin adhesive Liquid skin adhesive  Port 2 Dressing Status -- Dry;Intact  Port 3 Drainage Amount -- None  Port 3 Dressing Type Liquid skin adhesive Liquid skin adhesive  Port 3  Dressing Status -- Dry;Intact  Port 4 Drainage Amount -- None  Port 4 Dressing Type Liquid skin adhesive Liquid skin adhesive  Port 4 Dressing Status -- Dry;Intact  Port 5 Dressing Type Gauze (Comment);Transparent dressing Gauze (Comment);Transparent dressing  Port 5 Dressing Status -- Dry;Intact;Old drainage (marked);New drainage  Port 6 Drainage Amount -- None  Port 6 Dressing Type Liquid skin adhesive Liquid skin adhesive  Port 6 Dressing Status -- Dry;Intact     No associated orders.    Discharge Exam:   Vitals:   10/26/23 0449 10/26/23 0832 10/26/23 0835 10/26/23 0900  BP: (!) 103/51   127/73  Pulse: 86   88  Resp: 20   19  Temp: 98.5 F (36.9 C)   99.6 F (37.6 C)  TempSrc: Oral   Oral  SpO2: 94% 97% 97% 93%  Weight:      Height:        Body mass index is 35.88 kg/m.   General exam: Pleasant, elderly Caucasian male.  Not in visible distress Skin: No rashes, lesions or ulcers. HEENT: Atraumatic, normocephalic, no obvious bleeding Lungs: Clear to auscultation bilaterally, no wheezing, no crackles. CVS: S1, S2, mechanical aortic valve sound GI/Abd: Soft, nontender, nondistended, bowel sound present,   CNS: Alert, awake, oriented x 3 Psychiatry: Mood appropriate. Extremities: No pedal edema, no calf tenderness  Follow ups:    Follow-up Information     Marsa Miquel Faden, MD Follow up.   Specialty: Internal Medicine Contact information: [address] [place] [address] [phone]                 Discharge Instructions:   Discharge Instructions     Call MD for:  difficulty breathing, headache or visual disturbances   Complete by: As directed    Call MD for:  extreme fatigue   Complete by: As directed    Call MD for:  hives   Complete by: As directed    Call MD for:  persistant dizziness or light-headedness   Complete by: As directed    Call MD for:  persistant nausea and vomiting   Complete by: As directed    Call MD for:   severe uncontrolled pain   Complete by: As directed    Call MD for:  temperature >100.4   Complete by: As directed    Diet general   Complete by: As directed    Discharge instructions  Complete by: As directed    Recommendations at discharge:   Complete course of antibiotics with 5 more days of oral cefadroxil  with probiotics  INR is 3.5 today.  Please skip today's dose of Coumadin   Follow-up with PCP next 2 days to monitor for INR and WBC count  Your iron level is low, you have been started on daily iron pills  To avoid constipation getting worse with iron, I have started you on scheduled and PRN bowel regimen.  General discharge instructions: Follow with Primary MD Marsa Miquel Faden, MD in 7 days  Please request your PCP  to go over your hospital tests, procedures, radiology results at the follow up. Please get your medicines reviewed and adjusted.  Your PCP may decide to repeat certain labs or tests as needed. Do not drive, operate heavy machinery, perform activities at heights, swimming or participation in water  activities or provide baby sitting services if your were admitted for syncope or siezures until you have seen by Primary MD or a Neurologist and advised to do so again. [place]  Controlled Substance Reporting System database was reviewed. Do not drive, operate heavy machinery, perform activities at heights, swim, participate in water  activities or provide baby-sitting services while on medications for pain, sleep and mood until your outpatient physician has reevaluated you and advised to do so again.  You are strongly recommended to comply with the dose, frequency and duration of prescribed medications. Activity: As tolerated with Full fall precautions use walker/cane & assistance as needed Avoid using any recreational substances like cigarette, tobacco, alcohol, or non-prescribed drug. If you experience worsening of your admission symptoms, develop shortness of  breath, life threatening emergency, suicidal or homicidal thoughts you must seek medical attention immediately by calling 911 or calling your MD immediately  if symptoms less severe. You must read complete instructions/literature along with all the possible adverse reactions/side effects for all the medicines you take and that have been prescribed to you. Take any new medicine only after you have completely understood and accepted all the possible adverse reactions/side effects.  Wear Seat belts while driving. You were cared for by a hospitalist during your hospital stay. If you have any questions about your discharge medications or the care you received while you were in the hospital after you are discharged, you can call the unit and ask to speak with the hospitalist or the covering physician. Once you are discharged, your primary care physician will handle any further medical issues. Please note that NO REFILLS for any discharge medications will be authorized once you are discharged, as it is imperative that you return to your primary care physician (or establish a relationship with a primary care physician if you do not have one).   Increase activity slowly   Complete by: As directed        Discharge Medications:   Allergies as of 10/26/2023       Reactions   Doxycycline  Other (See Comments), Hypertension   Headache and increases b/p        Medication List     STOP taking these medications    aspirin  EC 81 MG tablet   cefUROXime  500 MG tablet Commonly known as: CEFTIN    losartan  100 MG tablet Commonly known as: COZAAR        TAKE these medications    atorvastatin  80 MG tablet Commonly known as: LIPITOR  Take 1 tablet (80 mg total) by mouth daily. What changed: when to take this   budesonide-formoterol 160-4.5 MCG/ACT  inhaler Commonly known as: SYMBICORT Inhale 2 puffs into the lungs 2 (two) times daily.   CAL-MAG-ZINC PO Take 1 tablet by mouth at bedtime.    cefadroxil  500 MG capsule Commonly known as: DURICEF Take 1 capsule (500 mg total) by mouth 2 (two) times daily for 5 days.   doxazosin  2 MG tablet Commonly known as: CARDURA  Take 2 mg by mouth at bedtime.   doxylamine (Sleep) 25 MG tablet Commonly known as: UNISOM Take 25 mg by mouth at bedtime as needed for sleep.   ezetimibe  10 MG tablet Commonly known as: ZETIA  Take 1 tablet (10 mg total) by mouth daily. Please make yearly appt with Dr. Shlomo for November 2022 for future refills. Thank you 1st attempt What changed: additional instructions   ferrous sulfate  325 (65 FE) MG EC tablet Take 1 tablet (325 mg total) by mouth daily with breakfast.   FISH OIL PO Take 3 capsules by mouth at bedtime.   glipiZIDE 10 MG tablet Commonly known as: GLUCOTROL Take 10 mg by mouth at bedtime.   lactobacillus Pack Take 1 packet (1 g total) by mouth 3 (three) times daily with meals for 5 days.   LEG CRAMP RELIEF PO Take 1 tablet by mouth at bedtime as needed (for leg cramps).   levothyroxine  112 MCG tablet Commonly known as: SYNTHROID  Take 112-224 mcg by mouth See admin instructions. Take 224 mcg by mouth 30 minutes before breakfast on Sunday and 112 mcg on Mon/Tues/Wed/Thurs/Fri/Sat   loratadine 10 MG tablet Commonly known as: CLARITIN Take 10 mg by mouth in the morning.   losartan -hydrochlorothiazide 100-12.5 MG tablet Commonly known as: HYZAAR Take 1 tablet by mouth daily.   magnesium  oxide 400 (240 Mg) MG tablet Commonly known as: MAG-OX Take 400 mg by mouth in the morning and at bedtime.   metFORMIN 500 MG tablet Commonly known as: GLUCOPHAGE Take 500-1,000 mg by mouth See admin instructions. Take 500 mg by mouth in the morning and 1,000 mg at bedtime   methocarbamol  500 MG tablet Commonly known as: Robaxin  Take 1 tablet (500 mg total) by mouth every 8 (eight) hours as needed for muscle spasms.   metoprolol  tartrate 50 MG tablet Commonly known as: LOPRESSOR  Take 1  tablet (50 mg total) by mouth 2 (two) times daily. Please make yearly appt with Dr. Shlomo for November 2022 for future refills. Thank you 1st attempt What changed: additional instructions   omeprazole 40 MG capsule Commonly known as: PRILOSEC Take 40 mg by mouth in the morning and at bedtime.   OVER THE COUNTER MEDICATION Take 11.5 oz by mouth See admin instructions. [employer] Nutrition drink- Drink 11.5 ounces by mouth once a day   polyethylene glycol 17 g packet Commonly known as: MIRALAX  / GLYCOLAX  Take 17 g by mouth daily as needed for moderate constipation.   Potassium 99 MG Tabs Take 99 mg by mouth at bedtime.   ProAir  HFA 108 (90 Base) MCG/ACT inhaler Generic drug: albuterol  Inhale 2 puffs into the lungs every 6 (six) hours as needed for wheezing or shortness of breath.   senna-docusate 8.6-50 MG tablet Commonly known as: Senokot-S Take 1 tablet by mouth at bedtime.   Vitamin B-12 2500 MCG Subl Place 2,500 mcg under the tongue in the morning.   Vitamin D3 25 MCG (1000 UT) Caps Take 1,000 Units by mouth at bedtime.   warfarin 5 MG tablet Commonly known as: COUMADIN  Take 1 tablet (5 mg total) by mouth daily. Managed by [facility] What changed:  how much to take when to take this additional instructions         The results of significant diagnostics from this hospitalization (including imaging, microbiology, ancillary and laboratory) are listed below for reference.    Procedures and Diagnostic Studies:   ECHOCARDIOGRAM COMPLETE Result Date: 10/25/2023    ECHOCARDIOGRAM REPORT   Patient Name:   Michael Delgado Date of Exam: 10/25/2023 Medical Rec #:  [id number]    Height:       69.0 in Accession #:    [id number]   Weight:       243.0 lb Date of Birth:  September 23, 1960   BSA:          2.244 m Patient Age:    62 years     BP:           162/89 mmHg Patient Gender: M            HR:           82 bpm. Exam Location:  Inpatient Procedure: 2D Echo, Cardiac  Doppler, Color Doppler and Intracardiac            Opacification Agent (Both Spectral and Color Flow Doppler were            utilized during procedure). Indications:    Syncope R55  History:        Patient has prior history of Echocardiogram examinations, most                 recent 11/04/2022.  Sonographer:    Tinnie Gosling RDCS Referring Phys: [id number] [facility]  Sonographer Comments: Technically difficult study due to poor echo windows, suboptimal parasternal window, suboptimal apical window, no subcostal window and patient is obese. IMPRESSIONS  1. Left ventricular ejection fraction, by estimation, is 60 to 65%. The left ventricle has normal function. The left ventricle has no regional wall motion abnormalities. There is mild left ventricular hypertrophy. Left ventricular diastolic parameters are indeterminate.  2. Right ventricular systolic function is normal. The right ventricular size is normal.  3. Left atrial size was mildly dilated.  4. Mean mitral gradient higher than seen on TTE done 11/04/22 but HR higher. MV not well interogated At least moderate MS Can consider outpatient TEE and f/u with Dr Shlomo to further assess severity of MS and if functional from severe MAC. The mitral valve is degenerative. Trivial mitral valve regurgitation. Moderate mitral stenosis. Severe mitral annular calcification.  5. Prior AVR with 23 mm Sorin mechanical valve peak gradient actually lower than recorded on TTE done 11/04/22 32.5 mmHg -> 23 mmHg with normal DVI 0.55 No PVL. The aortic valve has been repaired/replaced. Aortic valve regurgitation is not visualized. No  aortic stenosis is present.  6. The inferior vena cava is normal in size with greater than 50% respiratory variability, suggesting right atrial pressure of 3 mmHg. FINDINGS  Left Ventricle: Left ventricular ejection fraction, by estimation, is 60 to 65%. The left ventricle has normal function. The left ventricle has no regional wall motion abnormalities.  Definity  contrast agent was given IV to delineate the left ventricular  endocardial borders. Strain was performed and the global longitudinal strain is indeterminate. The left ventricular internal cavity size was normal in size. There is mild left ventricular hypertrophy. Left ventricular diastolic parameters are indeterminate. Right Ventricle: The right ventricular size is normal. No increase in right ventricular wall thickness. Right ventricular systolic function is normal. Left Atrium: Left atrial size was  mildly dilated. Right Atrium: Right atrial size was normal in size. Pericardium: There is no evidence of pericardial effusion. Mitral Valve: Mean mitral gradient higher than seen on TTE done 11/04/22 but HR higher. MV not well interogated At least moderate MS Can consider outpatient TEE and f/u with Dr Shlomo to further assess severity of MS and if functional from severe MAC. The  mitral valve is degenerative in appearance. There is moderate thickening of the mitral valve leaflet(s). There is moderate calcification of the mitral valve leaflet(s). Severe mitral annular calcification. Trivial mitral valve regurgitation. Moderate mitral valve stenosis. MV peak gradient, 27.2 mmHg. The mean mitral valve gradient is 15.0 mmHg. Tricuspid Valve: The tricuspid valve is normal in structure. Tricuspid valve regurgitation is not demonstrated. No evidence of tricuspid stenosis. Aortic Valve: Prior AVR with 23 mm Sorin mechanical valve peak gradient actually lower than recorded on TTE done 11/04/22 32.5 mmHg -> 23 mmHg with normal DVI 0.55 No PVL. The aortic valve has been repaired/replaced. Aortic valve regurgitation is not visualized. No aortic stenosis is present. Aortic valve mean gradient measures 13.5 mmHg. Aortic valve peak gradient measures 23.0 mmHg. Aortic valve area, by VTI measures 1.73 cm. There is a 23 mm 23 mm Sorin [employer] top hat bileaflet mechanical valve mechanical valve present in the aortic position.  Pulmonic Valve: The pulmonic valve was normal in structure. Pulmonic valve regurgitation is trivial. No evidence of pulmonic stenosis. Aorta: The aortic root is normal in size and structure. Venous: The inferior vena cava is normal in size with greater than 50% respiratory variability, suggesting right atrial pressure of 3 mmHg. IAS/Shunts: No atrial level shunt detected by color flow Doppler. Additional Comments: 3D was performed not requiring image post processing on an independent workstation and was indeterminate.  LEFT VENTRICLE PLAX 2D LVIDd:         4.20 cm   Diastology LVIDs:         2.70 cm   LV e' medial:  5.44 cm/s LV PW:         1.20 cm   LV e' lateral: 3.92 cm/s LV IVS:        1.20 cm LVOT diam:     2.00 cm LV SV:         82 LV SV Index:   36 LVOT Area:     3.14 cm  RIGHT VENTRICLE RV S prime:     8.38 cm/s TAPSE (M-mode): 1.3 cm LEFT ATRIUM           Index LA diam:      3.80 cm 1.69 cm/m LA Vol (A4C): 75.0 ml 33.42 ml/m  AORTIC VALVE AV Area (Vmax):    1.41 cm AV Area (Vmean):   1.29 cm AV Area (VTI):     1.73 cm AV Vmax:           240.00 cm/s AV Vmean:          172.500 cm/s AV VTI:            0.473 m AV Peak Grad:      23.0 mmHg AV Mean Grad:      13.5 mmHg LVOT Vmax:         108.00 cm/s LVOT Vmean:        71.000 cm/s LVOT VTI:          0.260 m LVOT/AV VTI ratio: 0.55  AORTA Ao Root diam: 3.10 cm Ao Asc diam:  2.60 cm MITRAL VALVE MV Area VTI:  1.36 cm    SHUNTS MV Peak grad: 27.2 mmHg   Systemic VTI:  0.26 m MV Mean grad: 15.0 mmHg   Systemic Diam: 2.00 cm MV Vmax:      2.61 m/s MV Vmean:     178.0 cm/s Maude Emmer MD Electronically signed by Maude Emmer MD Signature Date/Time: 10/25/2023/11:27:17 AM    Final    MR THORACIC SPINE WO CONTRAST Result Date: 10/24/2023 CLINICAL DATA:  Bone lesion, thoracic spine, incidental EXAM: MRI THORACIC SPINE WITHOUT CONTRAST TECHNIQUE: Multiplanar, multisequence MR imaging of the thoracic spine was performed. No intravenous contrast was administered.  COMPARISON:  CT angiogram of the chest dated October 24, 2023 and August 10, 2014. FINDINGS: Alignment:  Normal. Vertebrae: There is a mild chronic compression deformity of T6, which is markedly hypointense in signal intensity on the T1 and T2 weighted images from sclerosis. The vertebral body has lost approximately 15% of its height centrally. There is mild bowing of the posterior wall. There is no significant edema evident. There is mild edema present anteriorly in the superior corner of the T7 vertebral body, likely discogenic. There is also a subtle Schmorl's node within the superior endplate of T1. Cord:  Normal in morphology and signal intensity. Paraspinal and other soft tissues: Negative. Disc levels: The disc spaces are well preserved throughout the thoracic spine. The spinal canal and neural foramina are patent. IMPRESSION: 1. Mild compression deformity of T6, which is chronic and the likely secondary to an underlying hemangioma, which was readily apparent on the previous CTs of the thoracic spine. There is slight posterior bowing of the posterior wall of vertebral body common but no significant spinal canal or neural foraminal stenosis. Electronically Signed   By: Evalene Coho M.D.   On: 10/24/2023 19:13   CT Angio Chest PE W and/or Wo Contrast Result Date: 10/24/2023 CLINICAL DATA:  Pulmonary embolism suspected, high probability EXAM: CT ANGIOGRAPHY CHEST WITH CONTRAST TECHNIQUE: Multidetector CT imaging of the chest was performed using the standard protocol during bolus administration of intravenous contrast. Multiplanar CT image reconstructions and MIPs were obtained to evaluate the vascular anatomy. RADIATION DOSE REDUCTION: This exam was performed according to the departmental dose-optimization program which includes automated exposure control, adjustment of the mA and/or kV according to patient size and/or use of iterative reconstruction technique. CONTRAST:  75mL OMNIPAQUE  IOHEXOL  350 MG/ML  SOLN COMPARISON:  Chest x-ray October 24, 2023, chest CT December 11, 2017 FINDINGS: Exam is suboptimal for evaluation of PE given the inadequate opacification of the pulmonary artery. Cardiovascular: Mild cardiomegaly. Atherosclerotic calcifications of aorta and coronary arteries. No suspicious finding to suggest pulmonary embolism. Artificial aortic valve. Status post median sternotomy and postsurgical changes in anterior mediastinum. Mediastinum/Nodes: Subcentimeter mediastinal lymph nodes in right lower paratracheal, bilateral hilar and subcarinal nodal stations. Lungs/Pleura: Multifocal bronchocentric solid and ground-glass conglomerate nodules throughout entire perihilar right lung suggestive of multifocal infectious/inflammatory process (multifocal pneumonia). Small right pleural effusion. There is severe bronchial and bronchiolar wall thickening on the right. Upper Abdomen: Hepatomegaly. Gallbladder wall thickening versus/dense intraluminal sludge. Left kidney nonobstructing nephrolithiasis. Hiatal hernia. Musculoskeletal: Sclerotic changes and possible compression fracture at the level of T6 vertebral body site of known hemangioma, new to prior CT from 2019. Review of the MIP images confirms the above findings. IMPRESSION: Multifocal infectious/inflammatory process of right lung (pneumonia) with associated small right parapneumonic effusion. Recommend follow-up to ensure resolution. No suspicious findings to suggest pulmonary embolism. New sclerotic changes of T6 vertebral body and compression fracture at  the site of known osseous hemangioma. Recommend MRI thoracic spine for further assessment. Aortic Atherosclerosis (ICD10-I70.0). Electronically Signed   By: Megan  Zare M.D.   On: 10/24/2023 12:48   DG Chest Portable 1 View Result Date: 10/24/2023 CLINICAL DATA:  Dyspnea, fever, cough EXAM: PORTABLE CHEST - 1 VIEW COMPARISON:  January 25, 2023 FINDINGS: Perihilar airspace disease throughout the right lung.  No pleural effusion or pneumothorax. No cardiomegaly. Tortuous aorta with aortic atherosclerosis. Sternotomy wires. No acute fracture or destructive lesions. Multilevel thoracic osteophytosis. IMPRESSION: Findings most consistent with multifocal pneumonia in the right lung. No parapneumonic effusion. Electronically Signed   By: Rogelia Myers M.D.   On: 10/24/2023 11:36     Labs:   Basic Metabolic Panel: Recent Labs  Lab 10/24/23 1026 10/24/23 1500 10/25/23 0433 10/26/23 0441  NA 138  --  139 139  K 4.8  --  4.1 4.2  CL 105  --  105 105  CO2 23  --  24 23  GLUCOSE 100*  --  208* 135*  BUN 34*  --  29* 30*  CREATININE 1.09  --  0.96 0.91  CALCIUM  8.3*  --  8.3* 8.5*  MG  --  1.9  --   --   PHOS  --  2.9  --   --    GFR Estimated Creatinine Clearance: 103 mL/min (by C-G formula based on SCr of 0.91 mg/dL). Liver Function Tests: Recent Labs  Lab 10/24/23 1026 10/25/23 0433  AST 29 23  ALT 23 22  ALKPHOS 85 69  BILITOT 0.5 0.5  PROT 6.6 7.2  ALBUMIN  3.8 3.5   No results for input(s): LIPASE, AMYLASE in the last 168 hours. No results for input(s): AMMONIA in the last 168 hours. Coagulation profile Recent Labs  Lab 10/24/23 1026 10/25/23 0433 10/26/23 0441  INR 2.8* 2.4* 3.5*    CBC: Recent Labs  Lab 10/24/23 1026 10/25/23 0433 10/26/23 0441  WBC 12.6* 18.3* 18.6*  NEUTROABS 11.1*  --   --   HGB 9.6* 9.8* 9.3*  HCT 30.1* 32.0* 30.3*  MCV 81.6 85.3 84.2  PLT 239 231 245   Cardiac Enzymes: No results for input(s): CKTOTAL, CKMB, CKMBINDEX, TROPONINI in the last 168 hours. BNP: Invalid input(s): POCBNP CBG: Recent Labs  Lab 10/25/23 1143 10/25/23 1641 10/25/23 2041 10/26/23 0809  GLUCAP 170* 175* 182* 130*   D-Dimer No results for input(s): DDIMER in the last 72 hours. Hgb A1c No results for input(s): HGBA1C in the last 72 hours. Lipid Profile No results for input(s): CHOL, HDL, LDLCALC, TRIG, CHOLHDL, LDLDIRECT  in the last 72 hours. Thyroid  function studies Recent Labs    10/25/23 0833  TSH 1.632   Anemia work up Recent Labs    10/25/23 0833  VITAMINB12 684  FOLATE 7.0  FERRITIN 52  TIBC 377  IRON 23*  RETICCTPCT 1.4   Microbiology Recent Results (from the past 240 hours)  Culture, blood (Routine x 2)     Status: None (Preliminary result)   Collection Time: 10/24/23 10:27 AM   Specimen: BLOOD LEFT FOREARM  Result Value Ref Range Status   Specimen Description   Final    BLOOD LEFT FOREARM Performed at [facility], [address] [place], [place] [address]    Special Requests   Final    BOTTLES DRAWN AEROBIC AND ANAEROBIC Blood Culture adequate volume Performed at [facility], [address], [place], [place] [address]  Culture   Final    NO GROWTH 2 DAYS Performed at [facility], [address], [place], [place] [address]    Report Status PENDING  Incomplete  Culture, blood (Routine x 2)     Status: None (Preliminary result)   Collection Time: 10/24/23 10:27 AM   Specimen: BLOOD RIGHT FOREARM  Result Value Ref Range Status   Specimen Description   Final    BLOOD RIGHT FOREARM Performed at [facility], [address] [place], [place] [address]    Special Requests   Final    BOTTLES DRAWN AEROBIC AND ANAEROBIC Blood Culture adequate volume Performed at [facility], [address] [place], [place] [address]    Culture   Final    NO GROWTH 2 DAYS Performed at [facility], [address] [place], [place] [address]    Report Status PENDING  Incomplete  Expectorated Sputum Assessment w Gram Stain, Rflx to Resp Cult     Status: None   Collection Time: 10/24/23  5:23 PM   Specimen: Expectorated Sputum  Result Value Ref Range Status   Specimen Description EXPECTORATED SPUTUM  Final   Special Requests NONE  Final   Sputum evaluation   Final    Sputum specimen not acceptable for testing.  Please recollect.    Performed at [facility], [address] [place], [place] [address]    Report Status 10/24/2023 FINAL  Final    Time coordinating discharge: 45 minutes  Signed: Dakia Schifano  [facility] 10/26/2023, 11:34 AM

## 2023-10-27 LAB — HEMOGLOBIN A1C
Hgb A1c MFr Bld: 6.5 % — ABNORMAL HIGH (ref 4.8–5.6)
Mean Plasma Glucose: 140 mg/dL

## 2023-10-29 LAB — CULTURE, BLOOD (ROUTINE X 2)
Culture: NO GROWTH
Culture: NO GROWTH
Special Requests: ADEQUATE
Special Requests: ADEQUATE

## 2023-11-29 ENCOUNTER — Other Ambulatory Visit: Payer: Self-pay

## 2023-11-29 ENCOUNTER — Emergency Department (HOSPITAL_COMMUNITY)

## 2023-11-29 ENCOUNTER — Emergency Department (HOSPITAL_COMMUNITY)
Admission: EM | Admit: 2023-11-29 | Discharge: 2023-11-30 | Disposition: A | Discharge status: Home / Self Care | Attending: Emergency Medicine | Admitting: Emergency Medicine

## 2023-11-29 ENCOUNTER — Encounter (HOSPITAL_COMMUNITY): Payer: Self-pay | Admitting: Emergency Medicine

## 2023-11-29 DIAGNOSIS — R509 Fever, unspecified: Secondary | ICD-10-CM | POA: Diagnosis present

## 2023-11-29 DIAGNOSIS — D72829 Elevated white blood cell count, unspecified: Secondary | ICD-10-CM | POA: Diagnosis not present

## 2023-11-29 DIAGNOSIS — Z79899 Other long term (current) drug therapy: Secondary | ICD-10-CM | POA: Diagnosis not present

## 2023-11-29 DIAGNOSIS — E119 Type 2 diabetes mellitus without complications: Secondary | ICD-10-CM | POA: Insufficient documentation

## 2023-11-29 DIAGNOSIS — Z7984 Long term (current) use of oral hypoglycemic drugs: Secondary | ICD-10-CM | POA: Insufficient documentation

## 2023-11-29 DIAGNOSIS — I10 Essential (primary) hypertension: Secondary | ICD-10-CM | POA: Insufficient documentation

## 2023-11-29 DIAGNOSIS — R051 Acute cough: Secondary | ICD-10-CM | POA: Insufficient documentation

## 2023-11-29 DIAGNOSIS — Z951 Presence of aortocoronary bypass graft: Secondary | ICD-10-CM | POA: Diagnosis not present

## 2023-11-29 DIAGNOSIS — E039 Hypothyroidism, unspecified: Secondary | ICD-10-CM | POA: Diagnosis not present

## 2023-11-29 DIAGNOSIS — Z7901 Long term (current) use of anticoagulants: Secondary | ICD-10-CM | POA: Insufficient documentation

## 2023-11-29 DIAGNOSIS — Z79890 Hormone replacement therapy (postmenopausal): Secondary | ICD-10-CM | POA: Diagnosis not present

## 2023-11-29 DIAGNOSIS — Z7951 Long term (current) use of inhaled steroids: Secondary | ICD-10-CM | POA: Diagnosis not present

## 2023-11-29 DIAGNOSIS — I2510 Atherosclerotic heart disease of native coronary artery without angina pectoris: Secondary | ICD-10-CM | POA: Insufficient documentation

## 2023-11-29 DIAGNOSIS — J449 Chronic obstructive pulmonary disease, unspecified: Secondary | ICD-10-CM | POA: Insufficient documentation

## 2023-11-29 LAB — BASIC METABOLIC PANEL WITH GFR
Anion gap: 8 (ref 5–15)
BUN: 28 mg/dL — ABNORMAL HIGH (ref 8–23)
CO2: 23 mmol/L (ref 22–32)
Calcium: 8.5 mg/dL — ABNORMAL LOW (ref 8.9–10.3)
Chloride: 105 mmol/L (ref 98–111)
Creatinine, Ser: 0.98 mg/dL (ref 0.61–1.24)
GFR, Estimated: >60 mL/min (ref >60)
Glucose, Bld: 96 mg/dL (ref 70–99)
Potassium: 4.2 mmol/L (ref 3.5–5.1)
Sodium: 136 mmol/L (ref 135–145)

## 2023-11-29 LAB — CBC
HCT: 30 % — ABNORMAL LOW (ref 39.0–52.0)
Hemoglobin: 9.4 g/dL — ABNORMAL LOW (ref 13.0–17.0)
MCH: 26.2 pg (ref 26.0–34.0)
MCHC: 31.3 g/dL (ref 30.0–36.0)
MCV: 83.6 fL (ref 80.0–100.0)
Platelets: 231 K/uL (ref 150–400)
RBC: 3.59 MIL/uL — ABNORMAL LOW (ref 4.22–5.81)
RDW: 17 % — ABNORMAL HIGH (ref 11.5–15.5)
WBC: 11.6 K/uL — ABNORMAL HIGH (ref 4.0–10.5)
nRBC: 0 % (ref 0.0–0.2)

## 2023-11-29 LAB — RESP PANEL BY RT-PCR (RSV, FLU A&B, COVID)  RVPGX2
Influenza A by PCR: NEGATIVE
Influenza B by PCR: NEGATIVE
Resp Syncytial Virus by PCR: NEGATIVE
SARS Coronavirus 2 by RT PCR: NEGATIVE

## 2023-11-29 NOTE — ED Triage Notes (Signed)
 Patient presents due to a fever of 101.4 and cough for 4-5 days. He also has had shortness of breath. He denies nausea, vomiting and diarrhea.
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
## 2023-11-30 MED ORDER — IPRATROPIUM-ALBUTEROL 0.5-2.5 (3) MG/3ML IN SOLN
3.0000 mL | Freq: Once | RESPIRATORY_TRACT | Status: AC
  Administered 2023-11-30: 3 mL via RESPIRATORY_TRACT
  Filled 2023-11-30: qty 3

## 2023-11-30 MED ORDER — AZITHROMYCIN 250 MG PO TABS
500.0000 mg | ORAL_TABLET | Freq: Once | ORAL | Status: AC
  Administered 2023-11-30: 500 mg via ORAL
  Filled 2023-11-30: qty 2

## 2023-11-30 MED ORDER — AZITHROMYCIN 250 MG PO TABS: 250.0000 mg | ORAL_TABLET | Freq: Every day | ORAL | 0 refills | Status: DC

## 2023-11-30 MED ORDER — PREDNISONE 20 MG PO TABS: 40.0000 mg | ORAL_TABLET | Freq: Every day | ORAL | 0 refills | Status: AC

## 2023-11-30 MED ORDER — PREDNISONE 20 MG PO TABS
40.0000 mg | ORAL_TABLET | Freq: Every day | ORAL | 0 refills | Status: DC
Start: 2023-11-30 — End: 2023-11-30

## 2023-11-30 MED ORDER — PREDNISONE 20 MG PO TABS
60.0000 mg | ORAL_TABLET | Freq: Once | ORAL | Status: AC
  Administered 2023-11-30: 60 mg via ORAL
  Filled 2023-11-30: qty 3

## 2023-12-03 ENCOUNTER — Encounter (HOSPITAL_COMMUNITY): Payer: Self-pay

## 2023-12-03 ENCOUNTER — Observation Stay (HOSPITAL_COMMUNITY)
Admission: EM | Admit: 2023-12-03 | Discharge: 2023-12-04 | Disposition: A | Discharge status: Home / Self Care | Source: Ambulatory Visit | Attending: Family Medicine | Admitting: Family Medicine

## 2023-12-03 ENCOUNTER — Other Ambulatory Visit: Payer: Self-pay

## 2023-12-03 ENCOUNTER — Emergency Department (HOSPITAL_COMMUNITY)

## 2023-12-03 DIAGNOSIS — I480 Paroxysmal atrial fibrillation: Secondary | ICD-10-CM | POA: Insufficient documentation

## 2023-12-03 DIAGNOSIS — J441 Chronic obstructive pulmonary disease with (acute) exacerbation: Secondary | ICD-10-CM | POA: Diagnosis not present

## 2023-12-03 DIAGNOSIS — J45909 Unspecified asthma, uncomplicated: Secondary | ICD-10-CM | POA: Diagnosis not present

## 2023-12-03 DIAGNOSIS — I10 Essential (primary) hypertension: Secondary | ICD-10-CM | POA: Insufficient documentation

## 2023-12-03 DIAGNOSIS — E039 Hypothyroidism, unspecified: Secondary | ICD-10-CM | POA: Insufficient documentation

## 2023-12-03 DIAGNOSIS — E669 Obesity, unspecified: Secondary | ICD-10-CM | POA: Diagnosis not present

## 2023-12-03 DIAGNOSIS — J9601 Acute respiratory failure with hypoxia: Principal | ICD-10-CM | POA: Insufficient documentation

## 2023-12-03 DIAGNOSIS — Z7982 Long term (current) use of aspirin: Secondary | ICD-10-CM | POA: Diagnosis not present

## 2023-12-03 DIAGNOSIS — E782 Mixed hyperlipidemia: Secondary | ICD-10-CM | POA: Diagnosis present

## 2023-12-03 DIAGNOSIS — Z6833 Body mass index (BMI) 33.0-33.9, adult: Secondary | ICD-10-CM | POA: Diagnosis not present

## 2023-12-03 DIAGNOSIS — Z87891 Personal history of nicotine dependence: Secondary | ICD-10-CM | POA: Diagnosis not present

## 2023-12-03 DIAGNOSIS — Z952 Presence of prosthetic heart valve: Secondary | ICD-10-CM | POA: Insufficient documentation

## 2023-12-03 DIAGNOSIS — J189 Pneumonia, unspecified organism: Principal | ICD-10-CM

## 2023-12-03 DIAGNOSIS — R0602 Shortness of breath: Secondary | ICD-10-CM | POA: Diagnosis present

## 2023-12-03 DIAGNOSIS — Z954 Presence of other heart-valve replacement: Secondary | ICD-10-CM

## 2023-12-03 DIAGNOSIS — Z79899 Other long term (current) drug therapy: Secondary | ICD-10-CM | POA: Diagnosis not present

## 2023-12-03 DIAGNOSIS — I2510 Atherosclerotic heart disease of native coronary artery without angina pectoris: Secondary | ICD-10-CM | POA: Insufficient documentation

## 2023-12-03 DIAGNOSIS — D649 Anemia, unspecified: Secondary | ICD-10-CM | POA: Diagnosis not present

## 2023-12-03 DIAGNOSIS — E119 Type 2 diabetes mellitus without complications: Secondary | ICD-10-CM | POA: Insufficient documentation

## 2023-12-03 DIAGNOSIS — Z7984 Long term (current) use of oral hypoglycemic drugs: Secondary | ICD-10-CM | POA: Diagnosis not present

## 2023-12-03 DIAGNOSIS — E66811 Obesity, class 1: Secondary | ICD-10-CM | POA: Diagnosis present

## 2023-12-03 LAB — CBC WITH DIFFERENTIAL/PLATELET
Abs Immature Granulocytes: 0.09 K/uL — ABNORMAL HIGH (ref 0.00–0.07)
Basophils Absolute: 0 K/uL (ref 0.0–0.1)
Basophils Relative: 0 %
Eosinophils Absolute: 0 K/uL (ref 0.0–0.5)
Eosinophils Relative: 0 %
HCT: 29.8 % — ABNORMAL LOW (ref 39.0–52.0)
Hemoglobin: 9 g/dL — ABNORMAL LOW (ref 13.0–17.0)
Immature Granulocytes: 1 %
Lymphocytes Relative: 4 %
Lymphs Abs: 0.6 K/uL — ABNORMAL LOW (ref 0.7–4.0)
MCH: 25.3 pg — ABNORMAL LOW (ref 26.0–34.0)
MCHC: 30.2 g/dL (ref 30.0–36.0)
MCV: 83.7 fL (ref 80.0–100.0)
Monocytes Absolute: 1.1 K/uL — ABNORMAL HIGH (ref 0.1–1.0)
Monocytes Relative: 7 %
Neutro Abs: 14.4 K/uL — ABNORMAL HIGH (ref 1.7–7.7)
Neutrophils Relative %: 88 %
Platelets: 276 K/uL (ref 150–400)
RBC: 3.56 MIL/uL — ABNORMAL LOW (ref 4.22–5.81)
RDW: 16.9 % — ABNORMAL HIGH (ref 11.5–15.5)
WBC: 16.2 K/uL — ABNORMAL HIGH (ref 4.0–10.5)
nRBC: 0 % (ref 0.0–0.2)

## 2023-12-03 LAB — MAGNESIUM: Magnesium: 2.2 mg/dL (ref 1.7–2.4)

## 2023-12-03 LAB — GLUCOSE, CAPILLARY: Glucose-Capillary: 199 mg/dL — ABNORMAL HIGH (ref 70–99)

## 2023-12-03 LAB — BRAIN NATRIURETIC PEPTIDE: B Natriuretic Peptide: 144.5 pg/mL — ABNORMAL HIGH (ref 0.0–100.0)

## 2023-12-03 LAB — BASIC METABOLIC PANEL WITH GFR
Anion gap: 10 (ref 5–15)
BUN: 45 mg/dL — ABNORMAL HIGH (ref 8–23)
CO2: 22 mmol/L (ref 22–32)
Calcium: 8.6 mg/dL — ABNORMAL LOW (ref 8.9–10.3)
Chloride: 105 mmol/L (ref 98–111)
Creatinine, Ser: 1.27 mg/dL — ABNORMAL HIGH (ref 0.61–1.24)
GFR, Estimated: >60 mL/min (ref >60)
Glucose, Bld: 124 mg/dL — ABNORMAL HIGH (ref 70–99)
Potassium: 4.3 mmol/L (ref 3.5–5.1)
Sodium: 137 mmol/L (ref 135–145)

## 2023-12-03 LAB — PROCALCITONIN: Procalcitonin: <0.1 ng/mL

## 2023-12-03 LAB — RESP PANEL BY RT-PCR (RSV, FLU A&B, COVID)  RVPGX2
Influenza A by PCR: NEGATIVE
Influenza B by PCR: NEGATIVE
Resp Syncytial Virus by PCR: NEGATIVE
SARS Coronavirus 2 by RT PCR: NEGATIVE

## 2023-12-03 LAB — PHOSPHORUS: Phosphorus: 3.4 mg/dL (ref 2.5–4.6)

## 2023-12-03 LAB — PROTIME-INR
INR: 3.5 — ABNORMAL HIGH (ref 0.8–1.2)
Prothrombin Time: 36.3 s — ABNORMAL HIGH (ref 11.4–15.2)

## 2023-12-03 LAB — CBG MONITORING, ED: Glucose-Capillary: 239 mg/dL — ABNORMAL HIGH (ref 70–99)

## 2023-12-03 MED ORDER — ATORVASTATIN CALCIUM 40 MG PO TABS
80.0000 mg | ORAL_TABLET | Freq: Every day | ORAL | Status: DC
Start: 2023-12-03 — End: 2023-12-04
  Administered 2023-12-03: 80 mg via ORAL
  Filled 2023-12-03: qty 2

## 2023-12-03 MED ORDER — LEVOFLOXACIN IN D5W 500 MG/100ML IV SOLN
500.0000 mg | Freq: Once | INTRAVENOUS | Status: AC
  Administered 2023-12-03: 500 mg via INTRAVENOUS
  Filled 2023-12-03: qty 100

## 2023-12-03 MED ORDER — WARFARIN - PHARMACIST DOSING INPATIENT: Freq: Every day | Status: DC

## 2023-12-03 MED ORDER — ONDANSETRON HCL 4 MG PO TABS
4.0000 mg | ORAL_TABLET | Freq: Four times a day (QID) | ORAL | Status: DC | PRN
Start: 2023-12-03 — End: 2023-12-04

## 2023-12-03 MED ORDER — METFORMIN HCL 500 MG PO TABS: 500.0000 mg | ORAL_TABLET | Freq: Every day | ORAL | Status: DC

## 2023-12-03 MED ORDER — INSULIN ASPART 100 UNIT/ML IJ SOLN
0.0000 [IU] | Freq: Three times a day (TID) | INTRAMUSCULAR | Status: DC
  Filled 2023-12-03: qty 0.15

## 2023-12-03 MED ORDER — LEVOTHYROXINE SODIUM 112 MCG PO TABS: 224.0000 ug | ORAL_TABLET | ORAL | Status: DC

## 2023-12-03 MED ORDER — SODIUM CHLORIDE 0.9 % IV BOLUS
1000.0000 mL | Freq: Once | INTRAVENOUS | Status: AC
  Administered 2023-12-03: 1000 mL via INTRAVENOUS

## 2023-12-03 MED ORDER — METFORMIN HCL 500 MG PO TABS: 500.0000 mg | ORAL_TABLET | ORAL | Status: DC

## 2023-12-03 MED ORDER — ACETAMINOPHEN 325 MG PO TABS: 650.0000 mg | ORAL_TABLET | Freq: Four times a day (QID) | ORAL | Status: DC | PRN

## 2023-12-03 MED ORDER — VITAMIN D 25 MCG (1000 UNIT) PO TABS
1000.0000 [IU] | ORAL_TABLET | Freq: Every day | ORAL | Status: DC
  Administered 2023-12-03: 1000 [IU] via ORAL
  Filled 2023-12-03: qty 1

## 2023-12-03 MED ORDER — PREDNISONE 20 MG PO TABS: 40.0000 mg | ORAL_TABLET | Freq: Every day | ORAL | Status: DC

## 2023-12-03 MED ORDER — LOSARTAN POTASSIUM-HCTZ 100-12.5 MG PO TABS: 1.0000 | ORAL_TABLET | Freq: Every day | ORAL | Status: DC

## 2023-12-03 MED ORDER — METFORMIN HCL 500 MG PO TABS
1000.0000 mg | ORAL_TABLET | Freq: Every day | ORAL | Status: DC
  Administered 2023-12-03: 1000 mg via ORAL
  Filled 2023-12-03: qty 2

## 2023-12-03 MED ORDER — ALBUTEROL SULFATE (2.5 MG/3ML) 0.083% IN NEBU: 2.5000 mg | INHALATION_SOLUTION | RESPIRATORY_TRACT | Status: DC | PRN

## 2023-12-03 MED ORDER — GLIPIZIDE 10 MG PO TABS
10.0000 mg | ORAL_TABLET | Freq: Every day | ORAL | Status: DC
  Administered 2023-12-03: 10 mg via ORAL
  Filled 2023-12-03: qty 1

## 2023-12-03 MED ORDER — LEVOTHYROXINE SODIUM 112 MCG PO TABS: 112.0000 ug | ORAL_TABLET | ORAL | Status: DC

## 2023-12-03 MED ORDER — GUAIFENESIN ER 600 MG PO TB12
600.0000 mg | ORAL_TABLET | Freq: Two times a day (BID) | ORAL | Status: DC
  Administered 2023-12-03 – 2023-12-04 (×2): 600 mg via ORAL
  Filled 2023-12-03 (×2): qty 1

## 2023-12-03 MED ORDER — ACETAMINOPHEN 650 MG RE SUPP: 650.0000 mg | Freq: Four times a day (QID) | RECTAL | Status: DC | PRN

## 2023-12-03 MED ORDER — MELATONIN 5 MG PO TABS
10.0000 mg | ORAL_TABLET | Freq: Every evening | ORAL | Status: DC | PRN
  Administered 2023-12-03: 10 mg via ORAL
  Filled 2023-12-03: qty 2

## 2023-12-03 MED ORDER — ASPIRIN 81 MG PO TBEC
81.0000 mg | DELAYED_RELEASE_TABLET | Freq: Every morning | ORAL | Status: DC
  Administered 2023-12-04: 81 mg via ORAL
  Filled 2023-12-03: qty 1

## 2023-12-03 MED ORDER — INSULIN ASPART 100 UNIT/ML IJ SOLN
8.0000 [IU] | Freq: Once | INTRAMUSCULAR | Status: AC
  Administered 2023-12-03: 8 [IU] via SUBCUTANEOUS
  Filled 2023-12-03: qty 0.08

## 2023-12-03 MED ORDER — PANTOPRAZOLE SODIUM 40 MG PO TBEC
40.0000 mg | DELAYED_RELEASE_TABLET | Freq: Every day | ORAL | Status: DC
  Administered 2023-12-04: 40 mg via ORAL
  Filled 2023-12-03: qty 1

## 2023-12-03 MED ORDER — PREDNISONE 20 MG PO TABS
40.0000 mg | ORAL_TABLET | Freq: Every day | ORAL | Status: DC
  Administered 2023-12-04: 40 mg via ORAL
  Filled 2023-12-03: qty 2

## 2023-12-03 MED ORDER — METOPROLOL TARTRATE 50 MG PO TABS
50.0000 mg | ORAL_TABLET | Freq: Two times a day (BID) | ORAL | Status: DC
  Administered 2023-12-03 – 2023-12-04 (×2): 50 mg via ORAL
  Filled 2023-12-03 (×2): qty 1

## 2023-12-03 MED ORDER — SENNOSIDES-DOCUSATE SODIUM 8.6-50 MG PO TABS: 1.0000 | ORAL_TABLET | Freq: Every evening | ORAL | Status: DC | PRN

## 2023-12-03 MED ORDER — EZETIMIBE 10 MG PO TABS
10.0000 mg | ORAL_TABLET | Freq: Every day | ORAL | Status: DC
  Administered 2023-12-03: 10 mg via ORAL
  Filled 2023-12-03: qty 1

## 2023-12-03 MED ORDER — LEVOTHYROXINE SODIUM 112 MCG PO TABS
112.0000 ug | ORAL_TABLET | ORAL | Status: DC
  Administered 2023-12-04: 112 ug via ORAL
  Filled 2023-12-03: qty 1

## 2023-12-03 MED ORDER — MAGNESIUM OXIDE -MG SUPPLEMENT 400 (240 MG) MG PO TABS
400.0000 mg | ORAL_TABLET | Freq: Two times a day (BID) | ORAL | Status: DC
  Administered 2023-12-03 – 2023-12-04 (×2): 400 mg via ORAL
  Filled 2023-12-03 (×2): qty 1

## 2023-12-03 MED ORDER — FERROUS SULFATE 325 (65 FE) MG PO TABS
325.0000 mg | ORAL_TABLET | Freq: Every day | ORAL | Status: DC
  Administered 2023-12-04: 325 mg via ORAL
  Filled 2023-12-03: qty 1

## 2023-12-03 MED ORDER — ALBUTEROL SULFATE (2.5 MG/3ML) 0.083% IN NEBU
2.5000 mg | INHALATION_SOLUTION | Freq: Four times a day (QID) | RESPIRATORY_TRACT | Status: DC
  Administered 2023-12-03 – 2023-12-04 (×3): 2.5 mg via RESPIRATORY_TRACT
  Filled 2023-12-03 (×3): qty 3

## 2023-12-03 MED ORDER — ONDANSETRON HCL 4 MG/2ML IJ SOLN: 4.0000 mg | Freq: Four times a day (QID) | INTRAMUSCULAR | Status: DC | PRN

## 2023-12-03 MED ORDER — WARFARIN SODIUM 2.5 MG PO TABS
2.5000 mg | ORAL_TABLET | Freq: Once | ORAL | Status: AC
  Administered 2023-12-03: 2.5 mg via ORAL
  Filled 2023-12-03: qty 1

## 2023-12-03 NOTE — ED Provider Notes (Signed)
 [place] EMERGENCY DEPARTMENT AT [facility] Provider Note   CSN: [id number] Arrival date & time: 12/03/23  9047     Patient presents with: Shortness of Breath   Michael Delgado is a 63 y.o. male.  63 year old male presents with complaints of cough and shortness of breath x 5 days.  Patient was seen on Saturday at [facility] for same symptoms and was given prednisone  and antibiotics which patient advised he finished today.  Patient reports cough has been getting worse since Saturday and he called his doctor today and he advised him to come to the ED.  Patient's history is notable for COPD, pneumonia, history of cancer.  Patient reports he has inhaler at home and he took it this morning.  He reports cough at home with occasional bright red blood in phlegm.  He advises he has trouble sleeping at night laying down or sitting up due to the cough.  Patient is not on oxygen at home.  Patient denies chest pain, nausea, vomiting, diarrhea, fevers.  Prior to Admission medications   Medication Sig Start Date End Date Taking? Authorizing Provider  atorvastatin  (LIPITOR ) 80 MG tablet Take 1 tablet (80 mg total) by mouth daily. Patient taking differently: Take 80 mg by mouth at bedtime. 11/23/18   Hammond, Janine, NP  azithromycin  (ZITHROMAX  Z-PAK) 250 MG tablet Take 1 tablet (250 mg total) by mouth daily for 4 days. Take 250 mg once a day for days 2, 3, 4, and 5. 11/30/23 12/04/23  Cardama, Raynell Moder, MD  budesonide-formoterol [facility]) 160-4.5 MCG/ACT inhaler Inhale 2 puffs into the lungs 2 (two) times daily.    [provider]  Calcium -Magnesium -Zinc (CAL-MAG-ZINC PO) Take 1 tablet by mouth at bedtime.    [provider]  Cholecalciferol  (VITAMIN D3) 25 MCG (1000 UT) CAPS Take 1,000 Units by mouth at bedtime.    [provider]  Cyanocobalamin  (VITAMIN B-12) 2500 MCG SUBL Place 2,500 mcg under the tongue in the morning.    [provider]  doxazosin   (CARDURA ) 2 MG tablet Take 2 mg by mouth at bedtime.    [provider]  doxylamine, Sleep, (UNISOM) 25 MG tablet Take 25 mg by mouth at bedtime as needed for sleep.    [provider]  ezetimibe  (ZETIA ) 10 MG tablet Take 1 tablet (10 mg total) by mouth daily. Please make yearly appt with Dr. Shlomo for November 2022 for future refills. Thank you 1st attempt Patient taking differently: Take 10 mg by mouth daily. 01/19/21   Shlomo Wilbert SAUNDERS, MD  ferrous sulfate  325 (65 FE) MG EC tablet Take 1 tablet (325 mg total) by mouth daily with breakfast. 10/26/23 01/24/24  Dahal, Chapman, MD  glipiZIDE  (GLUCOTROL ) 10 MG tablet Take 10 mg by mouth at bedtime.    [provider]  Homeopathic Products (LEG CRAMP RELIEF PO) Take 1 tablet by mouth at bedtime as needed (for leg cramps).    [provider]  levothyroxine  (SYNTHROID ) 112 MCG tablet Take 112-224 mcg by mouth See admin instructions. Take 224 mcg by mouth 30 minutes before breakfast on Sunday and 112 mcg on Mon/Tues/Wed/Thurs/Fri/Sat    [provider]  loratadine (CLARITIN) 10 MG tablet Take 10 mg by mouth in the morning.    [provider]  losartan -hydrochlorothiazide (HYZAAR) 100-12.5 MG tablet Take 1 tablet by mouth daily.    [provider]  magnesium  oxide (MAG-OX) 400 (240 Mg) MG tablet Take 400 mg by mouth in the morning and  at bedtime.    [provider]  metFORMIN  (GLUCOPHAGE ) 500 MG tablet Take 500-1,000 mg by mouth See admin instructions. Take 500 mg by mouth in the morning and 1,000 mg at bedtime 06/11/21   [provider]  methocarbamol  (ROBAXIN ) 500 MG tablet Take 1 tablet (500 mg total) by mouth every 8 (eight) hours as needed for muscle spasms. Patient not taking: Reported on 10/24/2023 12/12/17   Vann, Jessica U, DO  metoprolol  tartrate (LOPRESSOR ) 50 MG tablet Take 1 tablet (50 mg total) by mouth 2 (two) times daily. Please make yearly appt with Dr. Shlomo for  November 2022 for future refills. Thank you 1st attempt Patient taking differently: Take 50 mg by mouth 2 (two) times daily. 01/19/21   Shlomo Wilbert SAUNDERS, MD  Omega-3 Fatty Acids (FISH OIL PO) Take 3 capsules by mouth at bedtime.    [provider]  omeprazole (PRILOSEC) 40 MG capsule Take 40 mg by mouth in the morning and at bedtime.    [provider]  OVER THE COUNTER MEDICATION Take 11.5 oz by mouth See admin instructions. [employer] Nutrition drink- Drink 11.5 ounces by mouth once a day    [provider]  polyethylene glycol (MIRALAX  / GLYCOLAX ) 17 g packet Take 17 g by mouth daily as needed for moderate constipation. 10/26/23   Dahal, Binaya, MD  Potassium 99 MG TABS Take 99 mg by mouth at bedtime.    [provider]  predniSONE  (DELTASONE ) 20 MG tablet Take 2 tablets (40 mg total) by mouth daily with breakfast for 4 days. 11/30/23 12/04/23  Trine Raynell Moder, MD  PROAIR  HFA [phone] Base) MCG/ACT inhaler Inhale 2 puffs into the lungs every 6 (six) hours as needed for wheezing or shortness of breath. 12/19/16   [provider]  senna-docusate (SENOKOT-S) 8.6-50 MG tablet Take 1 tablet by mouth at bedtime. 10/26/23 01/24/24  Arlice Reichert, MD  warfarin (COUMADIN ) 5 MG tablet Take 1 tablet (5 mg total) by mouth daily. Managed by [facility] Patient taking differently: Take 2.5-5 mg by mouth See admin instructions. Take 5 mg by mouth at bedtime on Sun/Mon/Wed/Fri/Sat and 2.5 mg on Tues/Thurs 06/29/18   Meng, Hao, [place]    Allergies: Doxycycline     Review of Systems  Respiratory:  Positive for cough and shortness of breath.   All other systems reviewed and are negative.   Updated Vital Signs BP (!) 97/57 (BP Location: Left Arm)   Pulse 75   Temp 98.3 F (36.8 C) (Oral)   Resp 18   Ht 5' 10 (1.778 m)   Wt 106.6 kg   SpO2 92%   BMI 33.72 kg/m   Physical Exam Vitals and nursing note reviewed.  Constitutional:      General: He  is not in acute distress.    Appearance: Normal appearance. He is not ill-appearing.  HENT:     Head: Normocephalic and atraumatic.  Eyes:     Extraocular Movements: Extraocular movements intact.     Pupils: Pupils are equal, round, and reactive to light.  Cardiovascular:     Rate and Rhythm: Normal rate.  Pulmonary:     Effort: Pulmonary effort is normal. No respiratory distress.     Breath sounds: Examination of the left-upper field reveals rhonchi. Examination of the right-middle field reveals rhonchi. Rhonchi present.  Abdominal:     Tenderness: There is no guarding.  Musculoskeletal:        General: Normal range of motion.  Cervical back: Normal range of motion.  Skin:    General: Skin is warm and dry.  Neurological:     General: No focal deficit present.     Mental Status: He is alert.  Psychiatric:        Mood and Affect: Mood normal.        Behavior: Behavior normal.     (all labs ordered are listed, but only abnormal results are displayed) Labs Reviewed  BASIC METABOLIC PANEL WITH GFR - Abnormal; Notable for the following components:      Result Value   Glucose, Bld 124 (*)    BUN 45 (*)    Creatinine, Ser 1.27 (*)    Calcium  8.6 (*)    All other components within normal limits  CBC WITH DIFFERENTIAL/PLATELET - Abnormal; Notable for the following components:   WBC 16.2 (*)    RBC 3.56 (*)    Hemoglobin 9.0 (*)    HCT 29.8 (*)    MCH 25.3 (*)    RDW 16.9 (*)    Neutro Abs 14.4 (*)    Lymphs Abs 0.6 (*)    Monocytes Absolute 1.1 (*)    Abs Immature Granulocytes 0.09 (*)    All other components within normal limits  RESP PANEL BY RT-PCR (RSV, FLU A&B, COVID)  RVPGX2  PROTIME-INR  BRAIN NATRIURETIC PEPTIDE  MAGNESIUM   PHOSPHORUS    EKG: EKG Interpretation Date/Time:  Wednesday December 03 2023 12:58:50 EDT Ventricular Rate:  71 PR Interval:  225 QRS Duration:  154 QT Interval:  435 QTC Calculation: 473 R Axis:   114  Text Interpretation: Sinus  rhythm Prolonged PR interval RBBB and LPFB Inferior infarct, old No acute changes Confirmed by Charlyn Sora [phone]) on 12/03/2023 1:25:19 PM  Radiology: DG Chest 2 View Result Date: 12/03/2023 CLINICAL DATA:  Shortness of breath. EXAM: CHEST - 2 VIEW COMPARISON:  November 29, 2023.  October 24, 2023. FINDINGS: Stable cardiomediastinal silhouette. Status post coronary bypass graft. No acute pulmonary disease is noted. Stable old midthoracic compression fracture. IMPRESSION: No active cardiopulmonary disease. Electronically Signed   By: Lynwood Landy Raddle M.D.   On: 12/03/2023 11:13     Procedures   Medications Ordered in the ED  sodium chloride  0.9 % bolus 1,000 mL (1,000 mLs Intravenous New Bag/Given 12/03/23 1248)  levofloxacin  (LEVAQUIN ) IVPB 500 mg (500 mg Intravenous New Bag/Given 12/03/23 1251)    63 y.o. male presents to the ED for concern of Shortness of Breath     This involves an extensive number of treatment options, and is a complaint that carries with it a high risk of complications and morbidity.  The emergent differential diagnosis prior to evaluation includes, but is not limited to: COPD exacerbation, pneumonia, PE, aortic dissection, pulmonary edema,  This is not an exhaustive differential.   Past Medical History / Co-morbidities / Social History: Hx of COPD, cancer, hypertension, thyroid  disease Social Determinants of Health include: Former tobacco use denies other  Additional History:  Obtained by chart review.  Notably, cardiac and GI follow ups for CAD and cancer of small intestines. Patient reports warfarin   Lab Tests: I ordered, and personally interpreted labs.  The pertinent results include: Elevated white count, elevated creatinine compared to Saturday.   Imaging Studies: I ordered imaging studies including 2 view chest x-ray.   I independently visualized and interpreted imaging which showed no acute cardiopulmonary disease. I agree with the radiologist  interpretation.  Cardiac Monitoring: The patient was maintained on  a cardiac monitor.  I personally viewed and interpreted the cardiac monitored which showed an underlying rhythm of: Sinus rhythm  ED Course / Critical Interventions: Pt well-appearing on exam laying on ED bed with family.  Lungs on auscultation show rhonchi in upper fields, patient history notable for COPD, patient is satting in the low 90s on room air.  No active bleeding noted on ENT exam but patient reports blood in phlegm yesterday.  Patient is able to talk in full sentences without increased work of breathing.  No fever currently in ED patient denies fever at home.  Patient has no nausea, vomiting, diarrhea, or lower extremity swelling.  Patient is having shortness of breath at night laying supine or sitting up.  Upon reevaluation, patient placed on 2 L oxygen via nasal cannula with improvement.  Patient was advised of findings from lab work and agreed to IV fluids and antibiotics.  After discussion with family they would feel more comfortable with admission and monitoring while the patient is on treatment for pneumonia.  Patient was admitted to hospitalist Dr. Celinda, patient care transferred.  Disposition: Considered admission and after reviewing the patient's encounter today, I feel that the patient would benefit from admission.  Discussed course of treatment with the patient, whom demonstrated understanding.  Patient in agreement and has no further questions.    I discussed this case with my attending, Dr. Charlyn, who agreed with the proposed treatment course and cosigned this note including patient's presenting symptoms, physical exam, and planned diagnostics and interventions.  Attending physician stated agreement with plan or made changes to plan which were implemented.     This chart was dictated using voice recognition software.  Despite best efforts to proofread, errors can occur which can change the documentation  meaning.   Final diagnoses:  Pneumonia of upper lobe due to infectious organism, unspecified laterality    ED Discharge Orders     None          Myriam Fonda GORMAN DEVONNA 12/03/23 1437    Charlyn Sora, MD 12/04/23 1026

## 2023-12-03 NOTE — Progress Notes (Signed)
 PHARMACY - ANTICOAGULATION CONSULT NOTE  Pharmacy Consult for warfarin Indication: mechanical AVR, afib  Allergies  Allergen Reactions   Doxycycline  Other (See Comments) and Hypertension    Headaches and increases b/p    Patient Measurements: Height: 5' 10 (177.8 cm) Weight: 106.6 kg (235 lb) IBW/kg (Calculated) : 73 HEPARIN  DW (KG): 95.9  Vital Signs: Temp: 97.9 F (36.6 C) (08/20 1516) Temp Source: Oral (08/20 1516) BP: 129/77 (08/20 1700) Pulse Rate: 74 (08/20 1700)  Labs: Recent Labs    12/03/23 1139 12/03/23 1430  HGB 9.0*  --   HCT 29.8*  --   PLT 276  --   LABPROT  --  36.3*  INR  --  3.5*  CREATININE 1.27*  --     Estimated Creatinine Clearance: 73.7 mL/min (A) (by C-G formula based on SCr of 1.27 mg/dL (H)).   Medical History: Past Medical History:  Diagnosis Date   Blood dyscrasia    lupus anti coagulant   Cancer (HCC) 08/2016   renal    Chronic anticoagulation    INR goal 2.5-3.5   COPD (chronic obstructive pulmonary disease) (HCC)    Coronary artery disease involving native coronary artery of native heart without angina pectoris 2018   s/p SVG to RCA   Erectile dysfunction    GERD (gastroesophageal reflux disease)    History of bowel infarction    Hx of Hodgkin's lymphoma    Hx of lupus anticoagulant disorder    history of blood work showing lupus   Hyperlipidemia    Hypertension    Hypothyroidism    Pneumonia    hx   RBBB 10/09/2017   S/P aortic valve replacement with mechanical valve 11/29/2016   23 mm Sorin [employer] top hat bileaflet mechanical valve   S/P CABG x 1 11/29/2016   Severe aortic stenosis    Thyroid  disease    Tobacco dependency       Assessment: 63 year old male on warfarin PTA for lupus anticoagulant disorder, mechanical AVR and afib. Patient presented with shortness of breath. He was recently seen a few days prior in the ED with fever and shortness of breath and given steroids/antibiotic and discharged.  Pharmacy consulted for management of warfarin.  Home warfarin dose - 5 mg all days except 2.5 mg on Tues/Thurs. Patient reports last dose 8/19.  INR therapeutic at 3.5 on admission. Did receive one dose of levofloxacin  in the ED.  Goal of Therapy:  INR 2.5-3.5 Monitor platelets by anticoagulation protocol: Yes   Plan:  -Given INR at upper limit of therapeutic and dose of levofloxacin  in the ED, will give one time dose of 2.5 mg today -Check daily INR  Stefano MARLA Bologna, PharmD, BCPS Clinical Pharmacist 12/03/2023 6:10 PM

## 2023-12-03 NOTE — H&P (Signed)
 History and Physical    Patient: Michael Delgado DOB: 05/17/1960 DOA: 12/03/2023 DOS: the patient was seen and examined on 12/03/2023 PCP: Marsa Miquel Faden, MD  Patient coming from: Home  Chief Complaint:  Chief Complaint  Patient presents with   Shortness of Breath   HPI: Michael Delgado is a 63 y.o. male with medical history significant of lupus anticoagulant on warfarin, renal cancer, COPD, CAD, CABG x 1, severe aortic stenosis, status post aortic mechanical valve replacement, GERD, bowel infarction, Hodgkin's lymphoma hyperlipidemia, hypertension, hypothyroidism, history of pneumonia who presented to the emergency department complaints of dyspnea associated with productive cough since last week, seen department 3 days ago, went home with prescriptions for prednisone  and azithromycin .  98.3 F, pulse 81, respiration 16, BP 102/59 mmHg O2 sat 95% on room air.  Patient received 1000 mL of normal saline bolus and levofloxacin  500 mg IVPB.  Lab work: Coronavirus, influenza [place] [facility] was negative.  CBC showed a white count of 16.2, hemoglobin 9.0 g/dL and platelets 723.  PT 36.3 and INR 3.5, glucose 124, BUN 45, creatinine 1.27 and calcium  8.  6 mg/dL.  The rest of the lites were normal.  Imaging: 2 view chest radiograph with no active cardiopulmonary disease.   Review of Systems: As mentioned in the history of present illness. All other systems reviewed and are negative. Past Medical History:  Diagnosis Date   Blood dyscrasia    lupus anti coagulant   Cancer (HCC) 08/2016   renal    Chronic anticoagulation    INR goal 2.5-3.5   COPD (chronic obstructive pulmonary disease) (HCC)    Coronary artery disease involving native coronary artery of native heart without angina pectoris 2018   s/p SVG to RCA   Erectile dysfunction    GERD (gastroesophageal reflux disease)    History of bowel infarction    Hx of Hodgkin's lymphoma    Hx of lupus anticoagulant disorder     history of blood work showing lupus   Hyperlipidemia    Hypertension    Hypothyroidism    Pneumonia    hx   RBBB 10/09/2017   S/P aortic valve replacement with mechanical valve 11/29/2016   23 mm Sorin Carbomedics top hat bileaflet mechanical valve   S/P CABG x 1 11/29/2016   Severe aortic stenosis    Thyroid  disease    Tobacco dependency    Past Surgical History:  Procedure Laterality Date   ABDOMINAL SURGERY  1999   AORTIC VALVE REPLACEMENT N/A 11/29/2016   Procedure: AORTIC VALVE REPLACEMENT (AVR) using Carbonmedics Valve;  Surgeon: Dusty Sudie DEL, MD;  Location: [facility] OR;  Service: Open Heart Surgery;  Laterality: N/A;   BOWEL INFARCTION  1997   HAD SURGERY AND FOUND TO HAVE LUPUS ANTICOAGULANT   CORONARY ARTERY BYPASS GRAFT N/A 11/29/2016   Procedure: CORONARY ARTERY BYPASS GRAFTING (CABG) x one, using right leg greater saphenous vein harvested endoscopically;  Surgeon: Dusty Sudie DEL, MD;  Location: [facility] OR;  Service: Open Heart Surgery;  Laterality: N/A;   HODGKIN  LYMPHOMA WITH RADIATION AND CHEMOTHERAPY  1984   RIGHT/LEFT HEART CATH AND CORONARY ANGIOGRAPHY N/A 10/23/2016   Procedure: Right/Left Heart Cath and Coronary Angiography;  Surgeon: Burnard Debby LABOR, MD;  Location: [facility] INVASIVE CV LAB;  Service: Cardiovascular;  Laterality: N/A;   ROBOTIC ASSITED PARTIAL NEPHRECTOMY Right 09/11/2016   Procedure: XI ROBOTIC ASSISTED RETROPERITONEAL PARTIAL NEPHRECTOMY;  Surgeon: Alvaro Hummer, MD;  Location: [facility] ORS;  Service: Urology;  Laterality: Right;  TEE WITHOUT CARDIOVERSION N/A 11/29/2016   Procedure: TRANSESOPHAGEAL ECHOCARDIOGRAM (TEE);  Surgeon: Dusty Sudie DEL, MD;  Location: [facility] OR;  Service: Open Heart Surgery;  Laterality: N/A;   THORACIC AORTOGRAM N/A 10/23/2016   Procedure: Thoracic Aortogram;  Surgeon: Burnard Debby LABOR, MD;  Location: [facility] INVASIVE CV LAB;  Service: Cardiovascular;  Laterality: N/A;   Social History:  reports that he quit smoking about 9 years ago. His  smoking use included cigarettes. He has never used smokeless tobacco. He reports that he does not drink alcohol and does not use drugs.  Allergies  Allergen Reactions   Doxycycline  Other (See Comments) and Hypertension    Headache and increases b/p    Family History  Problem Relation Age of Onset   Arthritis Mother    Hypertension Mother    High Cholesterol Mother    Cancer - Ovarian Mother    Diabetes Father    Hypertension Father    Arthritis Father    High Cholesterol Father    Arthritis Sister    Heart disease Brother    Hypertension Brother    Healthy Brother    Asthma Sister    Heart disease Sister     Prior to Admission medications   Medication Sig Start Date End Date Taking? Authorizing Provider  atorvastatin  (LIPITOR ) 80 MG tablet Take 1 tablet (80 mg total) by mouth daily. Patient taking differently: Take 80 mg by mouth at bedtime. 11/23/18   Hammond, Janine, NP  azithromycin  (ZITHROMAX  Z-PAK) 250 MG tablet Take 1 tablet (250 mg total) by mouth daily for 4 days. Take 250 mg once a day for days 2, 3, 4, and 5. 11/30/23 12/04/23  Cardama, Raynell Moder, MD  budesonide-formoterol [facility]) 160-4.5 MCG/ACT inhaler Inhale 2 puffs into the lungs 2 (two) times daily.    [provider]  Calcium -Magnesium -Zinc (CAL-MAG-ZINC PO) Take 1 tablet by mouth at bedtime.    [provider]  Cholecalciferol  (VITAMIN D3) 25 MCG (1000 UT) CAPS Take 1,000 Units by mouth at bedtime.    [provider]  Cyanocobalamin  (VITAMIN B-12) 2500 MCG SUBL Place 2,500 mcg under the tongue in the morning.    [provider]  doxazosin  (CARDURA ) 2 MG tablet Take 2 mg by mouth at bedtime.    [provider]  doxylamine, Sleep, (UNISOM) 25 MG tablet Take 25 mg by mouth at bedtime as needed for sleep.    [provider]  ezetimibe  (ZETIA ) 10 MG tablet Take 1 tablet (10 mg total) by mouth daily. Please make yearly appt with Dr. Shlomo for November 2022 for  future refills. Thank you 1st attempt Patient taking differently: Take 10 mg by mouth daily. 01/19/21   Shlomo Wilbert SAUNDERS, MD  ferrous sulfate  325 (65 FE) MG EC tablet Take 1 tablet (325 mg total) by mouth daily with breakfast. 10/26/23 01/24/24  Dahal, Chapman, MD  glipiZIDE  (GLUCOTROL ) 10 MG tablet Take 10 mg by mouth at bedtime.    [provider]  Homeopathic Products (LEG CRAMP RELIEF PO) Take 1 tablet by mouth at bedtime as needed (for leg cramps).    [provider]  levothyroxine  (SYNTHROID ) 112 MCG tablet Take 112-224 mcg by mouth See admin instructions. Take 224 mcg by mouth 30 minutes before breakfast on Sunday and 112 mcg on Mon/Tues/Wed/Thurs/Fri/Sat    [provider]  loratadine (CLARITIN) 10 MG tablet Take 10 mg by mouth in the morning.    [provider]  losartan -hydrochlorothiazide (HYZAAR) 100-12.5 MG tablet Take  1 tablet by mouth daily.    [provider]  magnesium  oxide (MAG-OX) 400 (240 Mg) MG tablet Take 400 mg by mouth in the morning and at bedtime.    [provider]  metFORMIN  (GLUCOPHAGE ) 500 MG tablet Take 500-1,000 mg by mouth See admin instructions. Take 500 mg by mouth in the morning and 1,000 mg at bedtime 06/11/21   [provider]  methocarbamol  (ROBAXIN ) 500 MG tablet Take 1 tablet (500 mg total) by mouth every 8 (eight) hours as needed for muscle spasms. Patient not taking: Reported on 10/24/2023 12/12/17   Vann, Jessica U, DO  metoprolol  tartrate (LOPRESSOR ) 50 MG tablet Take 1 tablet (50 mg total) by mouth 2 (two) times daily. Please make yearly appt with Dr. Shlomo for November 2022 for future refills. Thank you 1st attempt Patient taking differently: Take 50 mg by mouth 2 (two) times daily. 01/19/21   Shlomo Wilbert SAUNDERS, MD  Omega-3 Fatty Acids (FISH OIL PO) Take 3 capsules by mouth at bedtime.    [provider]  omeprazole (PRILOSEC) 40 MG capsule Take 40 mg by mouth in the morning and at bedtime.     [provider]  OVER THE COUNTER MEDICATION Take 11.5 oz by mouth See admin instructions. [employer] Nutrition drink- Drink 11.5 ounces by mouth once a day    [provider]  polyethylene glycol (MIRALAX  / GLYCOLAX ) 17 g packet Take 17 g by mouth daily as needed for moderate constipation. 10/26/23   Dahal, Binaya, MD  Potassium 99 MG TABS Take 99 mg by mouth at bedtime.    [provider]  predniSONE  (DELTASONE ) 20 MG tablet Take 2 tablets (40 mg total) by mouth daily with breakfast for 4 days. 11/30/23 12/04/23  Trine Raynell Moder, MD  PROAIR  HFA 108 [phone] Base) MCG/ACT inhaler Inhale 2 puffs into the lungs every 6 (six) hours as needed for wheezing or shortness of breath. 12/19/16   [provider]  senna-docusate (SENOKOT-S) 8.6-50 MG tablet Take 1 tablet by mouth at bedtime. 10/26/23 01/24/24  Arlice Reichert, MD  warfarin (COUMADIN ) 5 MG tablet Take 1 tablet (5 mg total) by mouth daily. Managed by [facility] Patient taking differently: Take 2.5-5 mg by mouth See admin instructions. Take 5 mg by mouth at bedtime on Sun/Mon/Wed/Fri/Sat and 2.5 mg on Tues/Thurs 06/29/18   Janene Boer, [place]    Physical Exam: Vitals:   12/03/23 0958 12/03/23 1000 12/03/23 1029 12/03/23 1037  BP: (!) 102/59  (!) 114/54 (!) 97/57  Pulse: 81  80 75  Resp: 16  18   Temp: 98.3 F (36.8 C)   98.3 F (36.8 C)  TempSrc: Oral   Oral  SpO2: 95%  95% 92%  Weight:  106.6 kg    Height:  5' 10 (1.778 m)     Physical Exam Vitals and nursing note reviewed.  Constitutional:      General: He is awake. He is not in acute distress.    Appearance: He is obese. He is ill-appearing.  HENT:     Head: Normocephalic.     Nose: No rhinorrhea.     Mouth/Throat:     Mouth: Mucous membranes are moist.  Eyes:     General: No scleral icterus.    Pupils: Pupils are equal, round, and reactive to light.  Neck:     Vascular: No JVD.  Cardiovascular:     Rate and Rhythm: Normal  rate and regular rhythm.  Pulmonary:  Effort: No tachypnea.     Breath sounds: Decreased breath sounds, wheezing and rhonchi present. No rales.  Abdominal:     General: Bowel sounds are normal.     Palpations: Abdomen is soft.     Tenderness: There is no abdominal tenderness. There is no right CVA tenderness or left CVA tenderness.  Musculoskeletal:     Cervical back: Neck supple.     Right lower leg: No edema.     Left lower leg: No edema.  Skin:    General: Skin is warm and dry.  Neurological:     General: No focal deficit present.     Mental Status: He is alert and oriented to person, place, and time.  Psychiatric:        Mood and Affect: Mood normal.        Behavior: Behavior normal. Behavior is cooperative.     Data Reviewed:  Results are pending, will review when available. 10/25/2023 echocardiogram report. IMPRESSIONS:   1. Left ventricular ejection fraction, by estimation, is 60 to 65%. The  left ventricle has normal function. The left ventricle has no regional  wall motion abnormalities. There is mild left ventricular hypertrophy.  Left ventricular diastolic parameters  are indeterminate.   2. Right ventricular systolic function is normal. The right ventricular  size is normal.   3. Left atrial size was mildly dilated.   4. Mean mitral gradient higher than seen on TTE done 11/04/22 but HR  higher. MV not well interogated At least moderate MS Can consider  outpatient TEE and f/u with Dr Shlomo to further assess severity of MS and  if functional from severe MAC. The mitral  valve is degenerative. Trivial mitral valve regurgitation. Moderate mitral  stenosis. Severe mitral annular calcification.   5. Prior AVR with 23 mm Sorin mechanical valve peak gradient actually  lower than recorded on TTE done 11/04/22 32.5 mmHg -> 23 mmHg with normal  DVI 0.55 No PVL. The aortic valve has been repaired/replaced. Aortic valve  regurgitation is not visualized. No   aortic  stenosis is present.   6. The inferior vena cava is normal in size with greater than 50%  respiratory variability, suggesting right atrial pressure of 3 mmHg.   EKG: Vent. rate 71 BPM PR interval 225 ms QRS duration 154 ms QT/QTcB 435/473 ms P-R-T axes 80 114 55 Sinus rhythm Prolonged PR interval RBBB and LPFB Inferior infarct, old  Assessment and Plan: Principal Problem:   Acute respiratory failure with hypoxia (HCC) In the setting of:   COPD exacerbation (HCC) Due to post pneumonia   Reactive airway disease Observation/telemetry Continue supplemental oxygen. Continue prednisone  40 mg p.o. daily in a.m. Scheduled and as needed bronchodilators. Check procalcitonin now and tomorrow morning. Will resume antibiotics depending on results. Follow-up CBC and chemistry in the morning.   Active Problems: S/P aortic valve replacement with metallic valve Continue warfarin per pharmacy.     Coronary artery disease involving native  coronary artery of native heart without angina pectoris Continue metoprolol  twice daily. Continue statin and warfarin.     Essential hypertension Continue metoprolol  50 mg p.o. twice daily. Continue Hyzaar 100/12.5 mg p.o. daily.     Paroxysmal atrial fibrillation (HCC) CHA?DS?-VASc Score of at least 3. On warfarin. Continue beta-blocker for rate control.     Mixed hyperlipidemia Continue atorvastatin  80 mg p.o. daily after med rec done. Continue ezetimibe  10 mg p.o. pending med rec.     Hypothyroidism Continue levothyroxine  per home instructions.  Controlled type 2 diabetes mellitus without complication,    without long-term current use of insulin  (HCC) Carbohydrate modified diet. Continue metformin  500 mg in the morning  Continue metformin  1000 in the evening. Continue glipizide  10 mg p.o. bedtime. CBG monitoring with [facility] SS while in the hospital.    Normocytic anemia Monitor hematocrit and hemoglobin. Transfuse if hemoglobin is  less than 8.0 mg/dL    Class 1 obesity Seems to be improving. Current BMI 33.72 kg/m. Continue lifestyle modifications. Follow-up closely with PCP.     Advance Care Planning:   Code Status: Full Code   Consults:   Family Communication: His daughter was at bedside.  Severity of Illness: The appropriate patient status for this patient is INPATIENT. Inpatient status is judged to be reasonable and necessary in order to provide the required intensity of service to ensure the patient's safety. The patient's presenting symptoms, physical exam findings, and initial radiographic and laboratory data in the context of their chronic comorbidities is felt to place them at high risk for further clinical deterioration. Furthermore, it is not anticipated that the patient will be medically stable for discharge from the hospital within 2 midnights of admission.   * I certify that at the point of admission it is my clinical judgment that the patient will require inpatient hospital care spanning beyond 2 midnights from the point of admission due to high intensity of service, high risk for further deterioration and high frequency of surveillance required.*  Author: Alm Dorn Castor, MD 12/03/2023 2:00 PM  For on call review [email or web address].   This document was prepared using Dragon voice recognition software and may contain some unintended transcription errors.

## 2023-12-03 NOTE — ED Triage Notes (Signed)
 Cough for over a week, seen here Saturday for same. Pt now thinks he has pneumonia. Pt just finished steroids this AM. C/o sob when trying to sleep and when exerting himself.

## 2023-12-04 ENCOUNTER — Other Ambulatory Visit (HOSPITAL_COMMUNITY): Payer: Self-pay

## 2023-12-04 DIAGNOSIS — J9601 Acute respiratory failure with hypoxia: Secondary | ICD-10-CM | POA: Diagnosis not present

## 2023-12-04 LAB — RESPIRATORY PANEL BY PCR

## 2023-12-04 LAB — COMPREHENSIVE METABOLIC PANEL WITH GFR
ALT: 19 U/L (ref 0–44)
AST: 14 U/L — ABNORMAL LOW (ref 15–41)
Albumin: 3.1 g/dL — ABNORMAL LOW (ref 3.5–5.0)
Alkaline Phosphatase: 60 U/L (ref 38–126)
Anion gap: 8 (ref 5–15)
BUN: 36 mg/dL — ABNORMAL HIGH (ref 8–23)
CO2: 25 mmol/L (ref 22–32)
Calcium: 8.5 mg/dL — ABNORMAL LOW (ref 8.9–10.3)
Chloride: 106 mmol/L (ref 98–111)
Creatinine, Ser: 1.01 mg/dL (ref 0.61–1.24)
GFR, Estimated: >60 mL/min (ref >60)
Glucose, Bld: 44 mg/dL — CL (ref 70–99)
Potassium: 3.9 mmol/L (ref 3.5–5.1)
Sodium: 139 mmol/L (ref 135–145)
Total Bilirubin: 0.7 mg/dL (ref 0.0–1.2)
Total Protein: 6.7 g/dL (ref 6.5–8.1)

## 2023-12-04 LAB — PROTIME-INR
INR: 3.5 — ABNORMAL HIGH (ref 0.8–1.2)
Prothrombin Time: 36.8 s — ABNORMAL HIGH (ref 11.4–15.2)

## 2023-12-04 LAB — HEMOGLOBIN A1C
Hgb A1c MFr Bld: 6.2 % — ABNORMAL HIGH (ref 4.8–5.6)
Mean Plasma Glucose: 131.24 mg/dL

## 2023-12-04 LAB — PROCALCITONIN: Procalcitonin: <0.1 ng/mL

## 2023-12-04 LAB — GLUCOSE, CAPILLARY
Glucose-Capillary: 40 mg/dL — CL (ref 70–99)
Glucose-Capillary: 67 mg/dL — ABNORMAL LOW (ref 70–99)
Glucose-Capillary: 74 mg/dL (ref 70–99)
Glucose-Capillary: 85 mg/dL (ref 70–99)
Glucose-Capillary: 115 mg/dL — ABNORMAL HIGH (ref 70–99)

## 2023-12-04 LAB — CBC
HCT: 29 % — ABNORMAL LOW (ref 39.0–52.0)
Hemoglobin: 8.9 g/dL — ABNORMAL LOW (ref 13.0–17.0)
MCH: 25.8 pg — ABNORMAL LOW (ref 26.0–34.0)
MCHC: 30.7 g/dL (ref 30.0–36.0)
MCV: 84.1 fL (ref 80.0–100.0)
Platelets: 268 K/uL (ref 150–400)
RBC: 3.45 MIL/uL — ABNORMAL LOW (ref 4.22–5.81)
RDW: 16.9 % — ABNORMAL HIGH (ref 11.5–15.5)
WBC: 12.7 K/uL — ABNORMAL HIGH (ref 4.0–10.5)
nRBC: 0 % (ref 0.0–0.2)

## 2023-12-04 MED ORDER — AZITHROMYCIN 250 MG PO TABS
250.0000 mg | ORAL_TABLET | Freq: Every day | ORAL | 0 refills | Status: AC
  Filled 2023-12-04: qty 4, 4d supply, fill #0

## 2023-12-04 MED ORDER — WARFARIN SODIUM 2.5 MG PO TABS: 2.5000 mg | ORAL_TABLET | Freq: Once | ORAL | Status: DC

## 2023-12-04 MED ORDER — IPRATROPIUM-ALBUTEROL 0.5-2.5 (3) MG/3ML IN SOLN: 3.0000 mL | Freq: Four times a day (QID) | RESPIRATORY_TRACT | Status: DC

## 2023-12-04 MED ORDER — INSULIN ASPART 100 UNIT/ML IJ SOLN: 0.0000 [IU] | Freq: Every day | INTRAMUSCULAR | Status: DC

## 2023-12-04 MED ORDER — ZOLPIDEM TARTRATE 5 MG PO TABS
5.0000 mg | ORAL_TABLET | Freq: Every evening | ORAL | 0 refills | Status: AC | PRN
  Filled 2023-12-04: qty 4, 4d supply, fill #0

## 2023-12-04 MED ORDER — PREDNISONE 50 MG PO TABS
50.0000 mg | ORAL_TABLET | Freq: Every day | ORAL | 0 refills | Status: AC
  Filled 2023-12-04: qty 4, 4d supply, fill #0

## 2023-12-04 MED ORDER — INSULIN ASPART 100 UNIT/ML IJ SOLN: 0.0000 [IU] | Freq: Three times a day (TID) | INTRAMUSCULAR | Status: DC

## 2023-12-04 NOTE — Progress Notes (Signed)
   12/04/23 1150  Spiritual Encounters  Type of Visit Initial  Care provided to: Patient  Reason for visit Advance directives  OnCall Visit No    Chaplain responded to AD spiritual care consult. Paperwork and information provided. Pt Michael Delgado expressed gratitude for the service and stated his wife would help him fill it out when she arrives.  Michael Delgado shared that he is hoping to be well enough to travel this weekend to meet his grandchild and celebrate a church dedication in [place] . He hopes to be discharged today.  With his permission, I offered a prayer which he was also grateful for. Chaplains remain available as needs arise.

## 2023-12-04 NOTE — Progress Notes (Signed)
 Hypoglycemic Event  CBG: 40  Treatment: 8 oz juice/soda  Symptoms: dizzy  Follow-up CBG: Time:0650 CBG Result:85   Possible Reasons for Event: Inadequate meal intake  Comments/MD notified:Notified NP    Michael Delgado Leilani Lilliahna Schubring

## 2023-12-04 NOTE — Plan of Care (Signed)
  Problem: Education: Goal: Ability to describe self-care measures that may prevent or decrease complications (Diabetes Survival Skills Education) will improve Outcome: Progressing Goal: Individualized Educational Video(s) Outcome: Progressing   Problem: Coping: Goal: Ability to adjust to condition or change in health will improve Outcome: Progressing   Problem: Fluid Volume: Goal: Ability to maintain a balanced intake and output will improve Outcome: Progressing   Problem: Health Behavior/Discharge Planning: Goal: Ability to identify and utilize available resources and services will improve Outcome: Progressing Goal: Ability to manage health-related needs will improve Outcome: Progressing   Problem: Metabolic: Goal: Ability to maintain appropriate glucose levels will improve Outcome: Progressing   Problem: Nutritional: Goal: Maintenance of adequate nutrition will improve Outcome: Progressing Goal: Progress toward achieving an optimal weight will improve Outcome: Progressing   Problem: Skin Integrity: Goal: Risk for impaired skin integrity will decrease Outcome: Progressing   Problem: Tissue Perfusion: Goal: Adequacy of tissue perfusion will improve Outcome: Progressing   Problem: Education: Goal: Knowledge of General Education information will improve Description: Including pain rating scale, medication(s)/side effects and non-pharmacologic comfort measures Outcome: Progressing   Problem: Health Behavior/Discharge Planning: Goal: Ability to manage health-related needs will improve Outcome: Progressing   Problem: Clinical Measurements: Goal: Ability to maintain clinical measurements within normal limits will improve Outcome: Progressing Goal: Will remain free from infection Outcome: Progressing Goal: Diagnostic test results will improve Outcome: Progressing Goal: Cardiovascular complication will be avoided Outcome: Progressing   Problem: Activity: Goal: Risk  for activity intolerance will decrease Outcome: Progressing   Problem: Coping: Goal: Level of anxiety will decrease Outcome: Progressing   Problem: Elimination: Goal: Will not experience complications related to bowel motility Outcome: Progressing Goal: Will not experience complications related to urinary retention Outcome: Progressing   Problem: Pain Managment: Goal: General experience of comfort will improve and/or be controlled Outcome: Progressing   Problem: Safety: Goal: Ability to remain free from injury will improve Outcome: Progressing   Problem: Skin Integrity: Goal: Risk for impaired skin integrity will decrease Outcome: Progressing   Problem: Education: Goal: Knowledge of disease or condition will improve Outcome: Progressing Goal: Knowledge of the prescribed therapeutic regimen will improve Outcome: Progressing Goal: Individualized Educational Video(s) Outcome: Progressing   Problem: Activity: Goal: Ability to tolerate increased activity will improve Outcome: Progressing Goal: Will verbalize the importance of balancing activity with adequate rest periods Outcome: Progressing   Problem: Respiratory: Goal: Ability to maintain a clear airway will improve Outcome: Progressing Goal: Levels of oxygenation will improve Outcome: Progressing Goal: Ability to maintain adequate ventilation will improve Outcome: Progressing   Problem: Clinical Measurements: Goal: Respiratory complications will improve Outcome: Not Progressing   Problem: Nutrition: Goal: Adequate nutrition will be maintained Outcome: Not Progressing

## 2023-12-04 NOTE — Discharge Summary (Signed)
 Physician Discharge Summary  Michael Delgado FMW:[id number] DOB: 17-Dec-1960 DOA: 12/03/2023  PCP: Marsa Miquel Faden, MD  Admit date: 12/03/2023 Discharge date: 12/04/2023 30 Day Unplanned Readmission Risk Score    Flowsheet Row ED to Hosp-Admission (Discharged) from 10/24/2023 in [facility]  30 Day Unplanned Readmission Risk Score (%) 22.21 Filed at 10/26/2023 1200    This score is the patient's risk of an unplanned readmission within 30 days of being discharged (0 -100%). The score is based on dignosis, age, lab data, medications, orders, and past utilization.   Low:  0-14.9   Medium: 15-21.9   High: 22-29.9   Extreme: 30 and above          Admitted From: Home Disposition: Home  Recommendations for Outpatient Follow-up:  Follow up with PCP in 1-2 weeks Please obtain BMP/CBC in one week Please follow up with your PCP on the following pending results: Unresulted Labs (From admission, onward)     Start     Ordered   12/04/23 0808  Respiratory (~20 pathogens) panel by PCR  (Respiratory panel by PCR (~20 pathogens, ~24 hr TAT)  w precautions)  Once,   R        12/04/23 0807   12/04/23 0500  Protime-INR  Daily,   R      12/03/23 1731              Home Health: None Equipment/Devices: None  Discharge Condition: Stable CODE STATUS: Full code Diet recommendation:  Diet Order             Diet Heart Room service appropriate? Yes; Fluid consistency: Thin  Diet effective now                   Subjective: Patient seen and examined, both wife and daughter at the bedside.  Patient has no complaints, denies any shortness of breath, chest pain or any other complaint.  He wishes to go home.  Due to brief hospitalization, I have copied admitting hospitalist HPI and ED workup. HPI: Michael Delgado is a 63 y.o. male with medical history significant of lupus anticoagulant on warfarin, renal cancer, COPD, CAD, CABG x 1, severe aortic  stenosis, status post aortic mechanical valve replacement, GERD, bowel infarction, Hodgkin's lymphoma hyperlipidemia, hypertension, hypothyroidism, history of pneumonia who presented to the emergency department complaints of dyspnea associated with productive cough since last week, seen department 3 days ago, went home with prescriptions for prednisone  and azithromycin .   98.3 F, pulse 81, respiration 16, BP 102/59 mmHg O2 sat 95% on room air.  Patient received 1000 mL of normal saline bolus and levofloxacin  500 mg IVPB.   Lab work: Coronavirus, influenza [place] was negative.  CBC showed a white count of 16.2, hemoglobin 9.0 g/dL and platelets 723.  PT 36.3 and INR 3.5, glucose 124, BUN 45, creatinine 1.27 and calcium  8.  6 mg/dL.  The rest of the lites were normal.  Brief/Interim Summary: Patient was briefly hospitalized due to acute hypoxic respiratory failure secondary to COPD exacerbation.  Started on prednisone  as well as bronchodilators.  He only required 2 L of oxygen briefly but has been off of oxygen since last many hours.  We also did his ambulatory oximetry and he was saturating more than 95% on room air all the time.  He denies any complaints, no wheezes on examination and he wants to go home.  Rest of the medical issues are stable.  Daughter and wife  had several concerns.  When I saw him earlier around noon, he had me talk to his daughter over the phone and I spoke to her for 15 minutes and addressed several concerns which I am going to document in detail below.  His daughter then showed up and his wife showed up as well, I went and talked to them at around 3 PM with the nurse present at the bedside with me and addressed their concerns once again.  Again documenting as below.  Patient is very eager to go home but family was not but at the end, they now appear to be satisfied and they are willing to take him home.  I am sending him home on 4 more days of prednisone  and azithromycin  and  Ambien  per their request.  Details below.  Daughter: Have you looked at his white cells, are they better? Me: Yes his white cells are actually better than yesterday and I reviewed extensively his chart and he appears to be having mild leukocytosis since September 2021.  He is afebrile as well.  Daughter: He was here for pneumonia just last month and we are concerned about infection again. Me: He was here for multifocal pneumonia a month ago and fortunately, his chest x-ray appears to be clear and he has no fever and is not hypoxic.  He does not appear to have infection.  Daughter: He has anemia.  We have spoken to his PCP and his PCP believes that patient should get iron infusion. Me: Patient's hemoglobin has remained stable since a month around 9.  His MCV is normal which argues against iron deficiency anemia.  Iron infusion is not indicated unless someone has evidence of iron deficiency anemia and severe anemia requiring blood transfusion.  I recommend having detailed conversations with PCP as to what the indications are for iron infusion.  Also, I recommend referral to hematologist.  Daughter informed me that he has an appointment with hematologist next month.  Daughter: Patient has outpatient appointment with pulmonologist however we would like for pulmonologist to see him in the hospital. Knee: Patient has very mild COPD exacerbation and he is now on room air and comfortable without wheezes or shortness of breath.  I do not see any indication for inpatient pulmonary consultation.  Outpatient follow-up in a month is just fine.  Daughter: What if he goes home and gets back in the hospital since he was in the hospital a month ago. Me: Patient has COPD and the last time his pulmonary function test were done was in 2018.  This needs to be updated.  He definitely has worsened COPD now.  With him having COPD, he definitely remains at risk of getting infection quicker than normal person and  unfortunately, he will be at risk of rehospitalization for COPD exacerbation down the road so it is hard to predict future and guarantee anything.  Daughter: He also likely has sleep apnea and we would like to get BiPAP machine. Me: Patient will need to have sleep study done before he can be prescribed BiPAP and unfortunately, sleep study cannot be done as inpatient, that is also an outpatient workup.  Diabetes mellitus: Patient did have hypoglycemia this morning.  For some reason, his sulfonylureas as well as metformin  both were resumed.  However since then his blood sugar has been 85-74-115.  His hemoglobin A1c is only 6.2.  I do believe that patient probably should be taken off of at least 1 of those medications however since family  has more trust in patient's PCP so I have deferred that to patient's PCP.  Discharge plan was discussed with patient and/or family member and they verbalized understanding and agreed with it.  Also to be noted that I have spent at least 75 minutes on this patient today that includes reviewing the chart, documentation, discharge paperwork and having 2 separate conversations with family members. Discharge Diagnoses:  Principal Problem:   Acute respiratory failure with hypoxia (HCC) Active Problems:   S/P aortic valve replacement with metallic valve   Coronary artery disease involving native coronary artery of native heart without angina pectoris   Essential hypertension   Controlled type 2 diabetes mellitus without complication, without long-term current use of insulin  (HCC)   Paroxysmal atrial fibrillation (HCC)   Mixed hyperlipidemia   Hypothyroidism   COPD exacerbation (HCC)   Normocytic anemia   Class 1 obesity   Reactive airway disease    Discharge Instructions   Allergies as of 12/04/2023       Reactions   Doxycycline  Other (See Comments), Hypertension   Headaches and increases b/p        Medication List     TAKE these medications     aspirin  EC 81 MG tablet Take 81 mg by mouth in the morning. Swallow whole.   atorvastatin  80 MG tablet Commonly known as: LIPITOR  Take 1 tablet (80 mg total) by mouth daily. What changed: when to take this   azithromycin  250 MG tablet Commonly known as: ZITHROMAX  Take 1 tablet (250 mg total) by mouth daily for 4 days. What changed: additional instructions   budesonide-formoterol 160-4.5 MCG/ACT inhaler Commonly known as: SYMBICORT Inhale 2 puffs into the lungs 2 (two) times daily.   CAL-MAG-ZINC PO Take 1 tablet by mouth at bedtime.   ezetimibe  10 MG tablet Commonly known as: ZETIA  Take 1 tablet (10 mg total) by mouth daily. Please make yearly appt with Dr. Shlomo for November 2022 for future refills. Thank you 1st attempt What changed:  when to take this additional instructions   ferrous sulfate  325 (65 FE) MG EC tablet Take 1 tablet (325 mg total) by mouth daily with breakfast.   FISH OIL PO Take 3 capsules by mouth at bedtime.   glipiZIDE  10 MG tablet Commonly known as: GLUCOTROL  Take 10 mg by mouth at bedtime.   LEG CRAMP RELIEF PO Take 1 tablet by mouth at bedtime as needed (for leg cramps).   levothyroxine  112 MCG tablet Commonly known as: SYNTHROID  Take 112-224 mcg by mouth See admin instructions. Take 224 mcg by mouth 30 minutes before breakfast on Sunday and 112 mcg on Mon/Tues/Wed/Thurs/Fri/Sat   loratadine 10 MG tablet Commonly known as: CLARITIN Take 10 mg by mouth in the morning.   losartan -hydrochlorothiazide 100-12.5 MG tablet Commonly known as: HYZAAR Take 1 tablet by mouth at bedtime.   magnesium  oxide 400 (240 Mg) MG tablet Commonly known as: MAG-OX Take 400 mg by mouth in the morning and at bedtime.   melatonin 5 MG Tabs Take 5 mg by mouth at bedtime as needed (for sleep).   metFORMIN  500 MG tablet Commonly known as: GLUCOPHAGE  Take 500-1,000 mg by mouth See admin instructions. Take 500 mg by mouth in the morning and 1,000 mg at  bedtime   methocarbamol  500 MG tablet Commonly known as: Robaxin  Take 1 tablet (500 mg total) by mouth every 8 (eight) hours as needed for muscle spasms.   metoprolol  tartrate 50 MG tablet Commonly known as: LOPRESSOR  Take 1 tablet (  50 mg total) by mouth 2 (two) times daily. Please make yearly appt with Dr. Shlomo for November 2022 for future refills. Thank you 1st attempt What changed:  when to take this additional instructions   omeprazole 40 MG capsule Commonly known as: PRILOSEC Take 40 mg by mouth in the morning and at bedtime.   polyethylene glycol 17 g packet Commonly known as: MIRALAX  / GLYCOLAX  Take 17 g by mouth daily as needed for moderate constipation.   Potassium 99 MG Tabs Take 99 mg by mouth at bedtime.   predniSONE  20 MG tablet Commonly known as: DELTASONE  Take 2 tablets (40 mg total) by mouth daily with breakfast for 4 days. What changed: Another medication with the same name was added. Make sure you understand how and when to take each.   predniSONE  50 MG tablet Commonly known as: DELTASONE  Take 1 tablet (50 mg total) by mouth daily with breakfast for 4 days. What changed: You were already taking a medication with the same name, and this prescription was added. Make sure you understand how and when to take each.   albuterol  (2.5 MG/3ML) 0.083% nebulizer solution Commonly known as: PROVENTIL  Take 2.5 mg by nebulization See admin instructions. Nebulize 2.5 mg and inhale into the lungs in the evening and up to an additional three times a day as needed for shortness of breath or wheezing   ProAir  HFA 108 (90 Base) MCG/ACT inhaler Generic drug: albuterol  Inhale 2 puffs into the lungs every 6 (six) hours as needed for wheezing or shortness of breath.   senna-docusate 8.6-50 MG tablet Commonly known as: Senokot-S Take 1 tablet by mouth at bedtime. What changed:  when to take this reasons to take this   Vitamin B-12 2500 MCG Subl Place 2,500 mcg under the  tongue in the morning.   Vitamin D3 25 MCG (1000 UT) Caps Take 1,000 Units by mouth at bedtime.   warfarin 5 MG tablet Commonly known as: COUMADIN  Take 1 tablet (5 mg total) by mouth daily. Managed by [facility] What changed:  how much to take when to take this additional instructions   zolpidem  5 MG tablet Commonly known as: Ambien  Take 1 tablet (5 mg total) by mouth at bedtime as needed for up to 4 days for sleep.        Follow-up Information     Marsa Miquel Faden, MD Follow up in 1 week(s).   Specialty: Internal Medicine Contact information: [address] [place] [address] [phone]                Allergies  Allergen Reactions   Doxycycline  Other (See Comments) and Hypertension    Headaches and increases b/p    Consultations: None   Procedures/Studies: DG Chest 2 View Result Date: 12/03/2023 CLINICAL DATA:  Shortness of breath. EXAM: CHEST - 2 VIEW COMPARISON:  November 29, 2023.  October 24, 2023. FINDINGS: Stable cardiomediastinal silhouette. Status post coronary bypass graft. No acute pulmonary disease is noted. Stable old midthoracic compression fracture. IMPRESSION: No active cardiopulmonary disease. Electronically Signed   By: Lynwood Landy Raddle M.D.   On: 12/03/2023 11:13   DG Chest 2 View Result Date: 11/29/2023 CLINICAL DATA:  Shortness of breath EXAM: CHEST - 2 VIEW COMPARISON:  10/24/2023, 06/30/2021, MRI 10/24/2023 FINDINGS: Post sternotomy changes and valve prosthesis. Normal cardiac size with dense aortic calcifications. No focal airspace disease, pleural effusion or pneumothorax. Moderate compression deformity of T6 vertebral body as seen on CT from July. IMPRESSION:  No active cardiopulmonary disease. Electronically Signed   By: Luke Bun M.D.   On: 11/29/2023 22:57     Discharge Exam: Vitals:   12/04/23 1111 12/04/23 1127  BP:  123/73  Pulse:  72  Resp:  16  Temp:  98.1 F (36.7 C)  SpO2: 96%    Vitals:    12/04/23 0733 12/04/23 0938 12/04/23 1111 12/04/23 1127  BP:  112/72  123/73  Pulse:  83  72  Resp:  17  16  Temp:  98.1 F (36.7 C)  98.1 F (36.7 C)  TempSrc:      SpO2: 96% 96% 96%   Weight:      Height:        General: Pt is alert, awake, not in acute distress Cardiovascular: RRR, S1/S2 +, no rubs, no gallops Respiratory: CTA bilaterally, no wheezing, no rhonchi Abdominal: Soft, NT, ND, bowel sounds + Extremities: no edema, no cyanosis    The results of significant diagnostics from this hospitalization (including imaging, microbiology, ancillary and laboratory) are listed below for reference.     Microbiology: Recent Results (from the past 240 hours)  Resp panel by RT-PCR (RSV, Flu A&B, Covid) Anterior Nasal Swab     Status: None   Collection Time: 11/29/23 10:14 PM   Specimen: Anterior Nasal Swab  Result Value Ref Range Status   SARS Coronavirus 2 by RT PCR NEGATIVE NEGATIVE Final    Comment: (NOTE) SARS-CoV-2 target nucleic acids are NOT DETECTED.  The SARS-CoV-2 RNA is generally detectable in upper respiratory specimens during the acute phase of infection. The lowest concentration of SARS-CoV-2 viral copies this assay can detect is 138 copies/mL. A negative result does not preclude SARS-Cov-2 infection and should not be used as the sole basis for treatment or other patient management decisions. A negative result may occur with  improper specimen collection/handling, submission of specimen other than nasopharyngeal swab, presence of viral mutation(s) within the areas targeted by this assay, and inadequate number of viral copies(<138 copies/mL). A negative result must be combined with clinical observations, patient history, and epidemiological information. The expected result is Negative.  Fact Sheet for Patients:  BloggerCourse.com  Fact Sheet for Healthcare Providers:  SeriousBroker.it  This test is no t yet  approved or cleared by the [place]  FDA and  has been authorized for detection and/or diagnosis of SARS-CoV-2 by FDA under an Emergency Use Authorization (EUA). This EUA will remain  in effect (meaning this test can be used) for the duration of the COVID-19 declaration under Section 564(b)(1) of the Act, 21 U.S.C.section 360bbb-3(b)(1), unless the authorization is terminated  or revoked sooner.       Influenza A by PCR NEGATIVE NEGATIVE Final   Influenza B by PCR NEGATIVE NEGATIVE Final    Comment: (NOTE) The Xpert Xpress SARS-CoV-2/FLU/RSV plus assay is intended as an aid in the diagnosis of influenza from Nasopharyngeal swab specimens and should not be used as a sole basis for treatment. Nasal washings and aspirates are unacceptable for Xpert Xpress SARS-CoV-2/FLU/RSV testing.  Fact Sheet for Patients: BloggerCourse.com  Fact Sheet for Healthcare Providers: SeriousBroker.it  This test is not yet approved or cleared by the [place]  FDA and has been authorized for detection and/or diagnosis of SARS-CoV-2 by FDA under an Emergency Use Authorization (EUA). This EUA will remain in effect (meaning this test can be used) for the duration of the COVID-19 declaration under Section 564(b)(1) of the Act, 21 U.S.C. section 360bbb-3(b)(1), unless the authorization is terminated  or revoked.     Resp Syncytial Virus by PCR NEGATIVE NEGATIVE Final    Comment: (NOTE) Fact Sheet for Patients: BloggerCourse.com  Fact Sheet for Healthcare Providers: SeriousBroker.it  This test is not yet approved or cleared by the [place]  FDA and has been authorized for detection and/or diagnosis of SARS-CoV-2 by FDA under an Emergency Use Authorization (EUA). This EUA will remain in effect (meaning this test can be used) for the duration of the COVID-19 declaration under Section 564(b)(1) of  the Act, 21 U.S.C. section 360bbb-3(b)(1), unless the authorization is terminated or revoked.  Performed at [facility], [address], [place], [place] [address]   Resp panel by RT-PCR (RSV, Flu A&B, Covid) Anterior Nasal Swab     Status: None   Collection Time: 12/03/23 11:39 AM   Specimen: Anterior Nasal Swab  Result Value Ref Range Status   SARS Coronavirus 2 by RT PCR NEGATIVE NEGATIVE Final    Comment: (NOTE) SARS-CoV-2 target nucleic acids are NOT DETECTED.  The SARS-CoV-2 RNA is generally detectable in upper respiratory specimens during the acute phase of infection. The lowest concentration of SARS-CoV-2 viral copies this assay can detect is 138 copies/mL. A negative result does not preclude SARS-Cov-2 infection and should not be used as the sole basis for treatment or other patient management decisions. A negative result may occur with  improper specimen collection/handling, submission of specimen other than nasopharyngeal swab, presence of viral mutation(s) within the areas targeted by this assay, and inadequate number of viral copies(<138 copies/mL). A negative result must be combined with clinical observations, patient history, and epidemiological information. The expected result is Negative.  Fact Sheet for Patients:  BloggerCourse.com  Fact Sheet for Healthcare Providers:  SeriousBroker.it  This test is no t yet approved or cleared by the [place]  FDA and  has been authorized for detection and/or diagnosis of SARS-CoV-2 by FDA under an Emergency Use Authorization (EUA). This EUA will remain  in effect (meaning this test can be used) for the duration of the COVID-19 declaration under Section 564(b)(1) of the Act, 21 U.S.C.section 360bbb-3(b)(1), unless the authorization is terminated  or revoked sooner.       Influenza A by PCR NEGATIVE NEGATIVE Final   Influenza B by PCR NEGATIVE  NEGATIVE Final    Comment: (NOTE) The Xpert Xpress SARS-CoV-2/FLU/RSV plus assay is intended as an aid in the diagnosis of influenza from Nasopharyngeal swab specimens and should not be used as a sole basis for treatment. Nasal washings and aspirates are unacceptable for Xpert Xpress SARS-CoV-2/FLU/RSV testing.  Fact Sheet for Patients: BloggerCourse.com  Fact Sheet for Healthcare Providers: SeriousBroker.it  This test is not yet approved or cleared by the [place]  FDA and has been authorized for detection and/or diagnosis of SARS-CoV-2 by FDA under an Emergency Use Authorization (EUA). This EUA will remain in effect (meaning this test can be used) for the duration of the COVID-19 declaration under Section 564(b)(1) of the Act, 21 U.S.C. section 360bbb-3(b)(1), unless the authorization is terminated or revoked.     Resp Syncytial Virus by PCR NEGATIVE NEGATIVE Final    Comment: (NOTE) Fact Sheet for Patients: BloggerCourse.com  Fact Sheet for Healthcare Providers: SeriousBroker.it  This test is not yet approved or cleared by the [place]  FDA and has been authorized for detection and/or diagnosis of SARS-CoV-2 by FDA under an Emergency Use Authorization (EUA). This EUA will remain in effect (meaning this test can be used) for the duration of the  COVID-19 declaration under Section 564(b)(1) of the Act, 21 U.S.C. section 360bbb-3(b)(1), unless the authorization is terminated or revoked.  Performed at [facility], [address], [place], [place] [address]      Labs: BNP (last 3 results) Recent Labs    12/03/23 2152  BNP 144.5*   Basic Metabolic Panel: Recent Labs  Lab 11/29/23 2307 12/03/23 1139 12/03/23 2152 12/04/23 0548  NA 136 137  --  139  K 4.2 4.3  --  3.9  CL 105 105  --  106  CO2 23 22  --  25  GLUCOSE 96 124*  --  44*   BUN 28* 45*  --  36*  CREATININE 0.98 1.27*  --  1.01  CALCIUM  8.5* 8.6*  --  8.5*  MG  --   --  2.2  --   PHOS  --   --  3.4  --    Liver Function Tests: Recent Labs  Lab 12/04/23 0548  AST 14*  ALT 19  ALKPHOS 60  BILITOT 0.7  PROT 6.7  ALBUMIN  3.1*   No results for input(s): LIPASE, AMYLASE in the last 168 hours. No results for input(s): AMMONIA in the last 168 hours. CBC: Recent Labs  Lab 11/29/23 2307 12/03/23 1139 12/04/23 0548  WBC 11.6* 16.2* 12.7*  NEUTROABS  --  14.4*  --   HGB 9.4* 9.0* 8.9*  HCT 30.0* 29.8* 29.0*  MCV 83.6 83.7 84.1  PLT 231 276 268   Cardiac Enzymes: No results for input(s): CKTOTAL, CKMB, CKMBINDEX, TROPONINI in the last 168 hours. BNP: Invalid input(s): POCBNP CBG: Recent Labs  Lab 12/04/23 0626 12/04/23 0638 12/04/23 0654 12/04/23 0723 12/04/23 1126  GLUCAP 40* 67* 85 74 115*   D-Dimer No results for input(s): DDIMER in the last 72 hours. Hgb A1c Recent Labs    12/04/23 0548  HGBA1C 6.2*   Lipid Profile No results for input(s): CHOL, HDL, LDLCALC, TRIG, CHOLHDL, LDLDIRECT in the last 72 hours. Thyroid  function studies No results for input(s): TSH, T4TOTAL, T3FREE, THYROIDAB in the last 72 hours.  Invalid input(s): FREET3 Anemia work up No results for input(s): VITAMINB12, FOLATE, FERRITIN, TIBC, IRON, RETICCTPCT in the last 72 hours. Urinalysis    Component Value Date/Time   COLORURINE YELLOW 10/24/2023 1506   APPEARANCEUR CLEAR 10/24/2023 1506   LABSPEC 1.010 10/24/2023 1506   PHURINE 6.5 10/24/2023 1506   GLUCOSEU NEGATIVE 10/24/2023 1506   HGBUR NEGATIVE 10/24/2023 1506   BILIRUBINUR NEGATIVE 10/24/2023 1506   KETONESUR NEGATIVE 10/24/2023 1506   PROTEINUR NEGATIVE 10/24/2023 1506   NITRITE NEGATIVE 10/24/2023 1506   LEUKOCYTESUR NEGATIVE 10/24/2023 1506   Sepsis Labs Recent Labs  Lab 11/29/23 2307 12/03/23 1139 12/04/23 0548  WBC 11.6* 16.2*  12.7*   Microbiology Recent Results (from the past 240 hours)  Resp panel by RT-PCR (RSV, Flu A&B, Covid) Anterior Nasal Swab     Status: None   Collection Time: 11/29/23 10:14 PM   Specimen: Anterior Nasal Swab  Result Value Ref Range Status   SARS Coronavirus 2 by RT PCR NEGATIVE NEGATIVE Final    Comment: (NOTE) SARS-CoV-2 target nucleic acids are NOT DETECTED.  The SARS-CoV-2 RNA is generally detectable in upper respiratory specimens during the acute phase of infection. The lowest concentration of SARS-CoV-2 viral copies this assay can detect is 138 copies/mL. A negative result does not preclude SARS-Cov-2 infection and should not be used as the sole basis for treatment or other patient management decisions. A  negative result may occur with  improper specimen collection/handling, submission of specimen other than nasopharyngeal swab, presence of viral mutation(s) within the areas targeted by this assay, and inadequate number of viral copies(<138 copies/mL). A negative result must be combined with clinical observations, patient history, and epidemiological information. The expected result is Negative.  Fact Sheet for Patients:  BloggerCourse.com  Fact Sheet for Healthcare Providers:  SeriousBroker.it  This test is no t yet approved or cleared by the [place]  FDA and  has been authorized for detection and/or diagnosis of SARS-CoV-2 by FDA under an Emergency Use Authorization (EUA). This EUA will remain  in effect (meaning this test can be used) for the duration of the COVID-19 declaration under Section 564(b)(1) of the Act, 21 U.S.C.section 360bbb-3(b)(1), unless the authorization is terminated  or revoked sooner.       Influenza A by PCR NEGATIVE NEGATIVE Final   Influenza B by PCR NEGATIVE NEGATIVE Final    Comment: (NOTE) The Xpert Xpress SARS-CoV-2/FLU/RSV plus assay is intended as an aid in the diagnosis of  influenza from Nasopharyngeal swab specimens and should not be used as a sole basis for treatment. Nasal washings and aspirates are unacceptable for Xpert Xpress SARS-CoV-2/FLU/RSV testing.  Fact Sheet for Patients: BloggerCourse.com  Fact Sheet for Healthcare Providers: SeriousBroker.it  This test is not yet approved or cleared by the [place]  FDA and has been authorized for detection and/or diagnosis of SARS-CoV-2 by FDA under an Emergency Use Authorization (EUA). This EUA will remain in effect (meaning this test can be used) for the duration of the COVID-19 declaration under Section 564(b)(1) of the Act, 21 U.S.C. section 360bbb-3(b)(1), unless the authorization is terminated or revoked.     Resp Syncytial Virus by PCR NEGATIVE NEGATIVE Final    Comment: (NOTE) Fact Sheet for Patients: BloggerCourse.com  Fact Sheet for Healthcare Providers: SeriousBroker.it  This test is not yet approved or cleared by the [place]  FDA and has been authorized for detection and/or diagnosis of SARS-CoV-2 by FDA under an Emergency Use Authorization (EUA). This EUA will remain in effect (meaning this test can be used) for the duration of the COVID-19 declaration under Section 564(b)(1) of the Act, 21 U.S.C. section 360bbb-3(b)(1), unless the authorization is terminated or revoked.  Performed at [facility], [address] [place], [place] [address]   Resp panel by RT-PCR (RSV, Flu A&B, Covid) Anterior Nasal Swab     Status: None   Collection Time: 12/03/23 11:39 AM   Specimen: Anterior Nasal Swab  Result Value Ref Range Status   SARS Coronavirus 2 by RT PCR NEGATIVE NEGATIVE Final    Comment: (NOTE) SARS-CoV-2 target nucleic acids are NOT DETECTED.  The SARS-CoV-2 RNA is generally detectable in upper respiratory specimens during the acute phase of infection.  The lowest concentration of SARS-CoV-2 viral copies this assay can detect is 138 copies/mL. A negative result does not preclude SARS-Cov-2 infection and should not be used as the sole basis for treatment or other patient management decisions. A negative result may occur with  improper specimen collection/handling, submission of specimen other than nasopharyngeal swab, presence of viral mutation(s) within the areas targeted by this assay, and inadequate number of viral copies(<138 copies/mL). A negative result must be combined with clinical observations, patient history, and epidemiological information. The expected result is Negative.  Fact Sheet for Patients:  BloggerCourse.com  Fact Sheet for Healthcare Providers:  SeriousBroker.it  This test is no t yet approved or cleared by the  [place]  FDA and  has been authorized for detection and/or diagnosis of SARS-CoV-2 by FDA under an Emergency Use Authorization (EUA). This EUA will remain  in effect (meaning this test can be used) for the duration of the COVID-19 declaration under Section 564(b)(1) of the Act, 21 U.S.C.section 360bbb-3(b)(1), unless the authorization is terminated  or revoked sooner.       Influenza A by PCR NEGATIVE NEGATIVE Final   Influenza B by PCR NEGATIVE NEGATIVE Final    Comment: (NOTE) The Xpert Xpress SARS-CoV-2/FLU/RSV plus assay is intended as an aid in the diagnosis of influenza from Nasopharyngeal swab specimens and should not be used as a sole basis for treatment. Nasal washings and aspirates are unacceptable for Xpert Xpress SARS-CoV-2/FLU/RSV testing.  Fact Sheet for Patients: BloggerCourse.com  Fact Sheet for Healthcare Providers: SeriousBroker.it  This test is not yet approved or cleared by the [place]  FDA and has been authorized for detection and/or diagnosis of SARS-CoV-2 by FDA  under an Emergency Use Authorization (EUA). This EUA will remain in effect (meaning this test can be used) for the duration of the COVID-19 declaration under Section 564(b)(1) of the Act, 21 U.S.C. section 360bbb-3(b)(1), unless the authorization is terminated or revoked.     Resp Syncytial Virus by PCR NEGATIVE NEGATIVE Final    Comment: (NOTE) Fact Sheet for Patients: BloggerCourse.com  Fact Sheet for Healthcare Providers: SeriousBroker.it  This test is not yet approved or cleared by the [place]  FDA and has been authorized for detection and/or diagnosis of SARS-CoV-2 by FDA under an Emergency Use Authorization (EUA). This EUA will remain in effect (meaning this test can be used) for the duration of the COVID-19 declaration under Section 564(b)(1) of the Act, 21 U.S.C. section 360bbb-3(b)(1), unless the authorization is terminated or revoked.  Performed at [facility], [address], [place], [place] [address]     FURTHER DISCHARGE INSTRUCTIONS:   Get Medicines reviewed and adjusted: Please take all your medications with you for your next visit with your Primary MD   Laboratory/radiological data: Please request your Primary MD to go over all hospital tests and procedure/radiological results at the follow up, please ask your Primary MD to get all Hospital records sent to his/her office.   In some cases, they will be blood work, cultures and biopsy results pending at the time of your discharge. Please request that your primary care M.D. goes through all the records of your hospital data and follows up on these results.   Also Note the following: If you experience worsening of your admission symptoms, develop shortness of breath, life threatening emergency, suicidal or homicidal thoughts you must seek medical attention immediately by calling 911 or calling your MD immediately  if symptoms less severe.    You must read complete instructions/literature along with all the possible adverse reactions/side effects for all the Medicines you take and that have been prescribed to you. Take any new Medicines after you have completely understood and accpet all the possible adverse reactions/side effects.    patient was instructed, not to drive, operate heavy machinery, perform activities at heights, swimming or participation in water  activities or provide baby-sitting services while on Pain, Sleep and Anxiety Medications; until their outpatient Physician has advised to do so again. Also recommended to not to take more than prescribed Pain, Sleep and Anxiety Medications.  It is not advisable to combine anxiety, sleep and pain medications without talking with your primary care provider.  Wear Seat belts while driving.   Please note: You were cared for by a hospitalist during your hospital stay. Once you are discharged, your primary care physician will handle any further medical issues. Please note that NO REFILLS for any discharge medications will be authorized once you are discharged, as it is imperative that you return to your primary care physician (or establish a relationship with a primary care physician if you do not have one) for your post hospital discharge needs so that they can reassess your need for medications and monitor your lab values  Time coordinating discharge: Over 30 minutes  SIGNED:   Fredia Skeeter, MD  [facility] 12/04/2023, 3:43 PM *Please note that this is a verbal dictation therefore any spelling or grammatical errors are due to the Dragon Medical One system interpretation. If 7PM-7AM, please contact night-coverage [email or web address]
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
## 2023-12-04 NOTE — Progress Notes (Signed)
 PHARMACY - ANTICOAGULATION CONSULT NOTE  Pharmacy Consult for warfarin Indication: mechanical AVR, afib  Allergies  Allergen Reactions   Doxycycline  Other (See Comments) and Hypertension    Headaches and increases b/p    Patient Measurements: Height: 5' 10 (177.8 cm) Weight: 106.6 kg (235 lb) IBW/kg (Calculated) : 73 HEPARIN  DW (KG): 95.9  Vital Signs: Temp: 97.8 F (36.6 C) (08/21 0519) Temp Source: Oral (08/21 0519) BP: 127/72 (08/21 0519) Pulse Rate: 65 (08/21 0519)  Labs: Recent Labs    12/03/23 1139 12/03/23 1430 12/04/23 0548  HGB 9.0*  --  8.9*  HCT 29.8*  --  29.0*  PLT 276  --  268  LABPROT  --  36.3* 36.8*  INR  --  3.5* 3.5*  CREATININE 1.27*  --  1.01    Estimated Creatinine Clearance: 92.7 mL/min (by C-G formula based on SCr of 1.01 mg/dL).   Medical History: Past Medical History:  Diagnosis Date   Blood dyscrasia    lupus anti coagulant   Cancer (HCC) 08/2016   renal    Chronic anticoagulation    INR goal 2.5-3.5   COPD (chronic obstructive pulmonary disease) (HCC)    Coronary artery disease involving native coronary artery of native heart without angina pectoris 2018   s/p SVG to RCA   Erectile dysfunction    GERD (gastroesophageal reflux disease)    History of bowel infarction    Hx of Hodgkin's lymphoma    Hx of lupus anticoagulant disorder    history of blood work showing lupus   Hyperlipidemia    Hypertension    Hypothyroidism    Pneumonia    hx   RBBB 10/09/2017   S/P aortic valve replacement with mechanical valve 11/29/2016   23 mm Sorin [employer] top hat bileaflet mechanical valve   S/P CABG x 1 11/29/2016   Severe aortic stenosis    Thyroid  disease    Tobacco dependency       Assessment: 63 year old male on warfarin PTA for lupus anticoagulant disorder, mechanical AVR and afib. Patient presented with shortness of breath. He was recently seen a few days prior in the ED with fever and shortness of breath and given  steroids/antibiotic and discharged. Pharmacy consulted for management of warfarin.  Home warfarin dose - 5 mg all days except 2.5 mg on Tues/Thurs. Patient reports last dose 8/19.  INR therapeutic at 3.5 on admission. Did receive one dose of levofloxacin  in the ED.  Goal of Therapy:  INR 2.5-3.5 Monitor platelets by anticoagulation protocol: Yes   Plan:  -Repeat warfarin 2.5 mg x 1 today -Check daily INR  Eleanor Agent, PharmD, BCPS Clinical Pharmacist 12/04/2023 8:05 AM

## 2023-12-04 NOTE — Progress Notes (Addendum)
 This nurse went over AVS, delivered patient meds from outpatient pharmacy.  All questions were answered from patient and family.  Patient left with family via w/c    Chiquita LULLA Longs, RN
